# Patient Record
Sex: Male | Born: 1955 | Race: White | Hispanic: No | Smoking: Current every day smoker
Health system: Southern US, Community
[De-identification: ages and names within clinical notes are randomized; demographics above are authoritative.]

## PROBLEM LIST (undated history)

## (undated) DIAGNOSIS — M1990 Unspecified osteoarthritis, unspecified site: Secondary | ICD-10-CM

## (undated) DIAGNOSIS — Z720 Tobacco use: Secondary | ICD-10-CM

## (undated) DIAGNOSIS — R011 Cardiac murmur, unspecified: Secondary | ICD-10-CM

## (undated) DIAGNOSIS — I7090 Unspecified atherosclerosis: Secondary | ICD-10-CM

## (undated) DIAGNOSIS — I2510 Atherosclerotic heart disease of native coronary artery without angina pectoris: Secondary | ICD-10-CM

## (undated) HISTORY — DX: Unspecified osteoarthritis, unspecified site: M19.90

## (undated) HISTORY — DX: Unspecified atherosclerosis: I70.90

## (undated) HISTORY — DX: Tobacco use: Z72.0

---

## 2015-12-13 HISTORY — PX: BACK SURGERY: SHX140

## 2016-08-01 DIAGNOSIS — M5441 Lumbago with sciatica, right side: Secondary | ICD-10-CM | POA: Insufficient documentation

## 2016-08-01 DIAGNOSIS — N529 Male erectile dysfunction, unspecified: Secondary | ICD-10-CM

## 2016-08-01 DIAGNOSIS — R600 Localized edema: Secondary | ICD-10-CM

## 2016-08-01 DIAGNOSIS — F17210 Nicotine dependence, cigarettes, uncomplicated: Secondary | ICD-10-CM | POA: Insufficient documentation

## 2016-08-01 HISTORY — DX: Nicotine dependence, cigarettes, uncomplicated: F17.210

## 2016-08-01 HISTORY — DX: Localized edema: R60.0

## 2016-08-01 HISTORY — DX: Male erectile dysfunction, unspecified: N52.9

## 2017-07-20 ENCOUNTER — Other Ambulatory Visit: Payer: Self-pay | Admitting: Neurosurgery

## 2017-08-02 ENCOUNTER — Encounter (HOSPITAL_COMMUNITY): Payer: Self-pay

## 2017-08-02 ENCOUNTER — Encounter (HOSPITAL_COMMUNITY)
Admission: RE | Admit: 2017-08-02 | Discharge: 2017-08-02 | Disposition: A | Discharge status: Home / Self Care | Payer: BLUE CROSS/BLUE SHIELD | Source: Ambulatory Visit | Attending: Neurosurgery | Admitting: Neurosurgery

## 2017-08-02 DIAGNOSIS — M5011 Cervical disc disorder with radiculopathy,  high cervical region: Secondary | ICD-10-CM | POA: Diagnosis not present

## 2017-08-02 DIAGNOSIS — M5001 Cervical disc disorder with myelopathy,  high cervical region: Secondary | ICD-10-CM | POA: Diagnosis not present

## 2017-08-02 DIAGNOSIS — M4802 Spinal stenosis, cervical region: Secondary | ICD-10-CM | POA: Diagnosis not present

## 2017-08-02 DIAGNOSIS — G9589 Other specified diseases of spinal cord: Secondary | ICD-10-CM | POA: Diagnosis not present

## 2017-08-02 DIAGNOSIS — Z01812 Encounter for preprocedural laboratory examination: Secondary | ICD-10-CM | POA: Diagnosis not present

## 2017-08-02 LAB — BASIC METABOLIC PANEL
Anion gap: 8 (ref 5–15)
BUN: 11 mg/dL (ref 6–20)
CALCIUM: 9.1 mg/dL (ref 8.9–10.3)
CO2: 25 mmol/L (ref 22–32)
CREATININE: 1.07 mg/dL (ref 0.61–1.24)
Chloride: 108 mmol/L (ref 101–111)
GLUCOSE: 109 mg/dL — AB (ref 65–99)
Potassium: 4.2 mmol/L (ref 3.5–5.1)
Sodium: 141 mmol/L (ref 135–145)

## 2017-08-02 LAB — CBC
HCT: 38.1 % — ABNORMAL LOW (ref 39.0–52.0)
Hemoglobin: 13.3 g/dL (ref 13.0–17.0)
MCH: 33.3 pg (ref 26.0–34.0)
MCHC: 34.9 g/dL (ref 30.0–36.0)
MCV: 95.5 fL (ref 78.0–100.0)
PLATELETS: 241 10*3/uL (ref 150–400)
RBC: 3.99 MIL/uL — ABNORMAL LOW (ref 4.22–5.81)
RDW: 14.4 % (ref 11.5–15.5)
WBC: 11.8 10*3/uL — ABNORMAL HIGH (ref 4.0–10.5)

## 2017-08-02 LAB — TYPE AND SCREEN
ABO/RH(D): A POS
Antibody Screen: NEGATIVE

## 2017-08-02 LAB — ABO/RH: ABO/RH(D): A POS

## 2017-08-02 LAB — SURGICAL PCR SCREEN
MRSA, PCR: NEGATIVE
STAPHYLOCOCCUS AUREUS: NEGATIVE

## 2017-08-02 MED ORDER — CHLORHEXIDINE GLUCONATE CLOTH 2 % EX PADS: 6.0000 | MEDICATED_PAD | Freq: Once | CUTANEOUS | Status: DC

## 2017-08-02 NOTE — Pre-Procedure Instructions (Addendum)
    Joel Bailey  08/02/2017     No Pharmacies Listed   Your procedure is scheduled on August 07, 2017.  Report to [facility] Admitting at 530 AM.  Call this number if you have problems the morning of surgery:  [phone]   Remember:  Do not eat food or drink liquids after midnight.  Take these medicines the morning of surgery with A SIP OF WATER tylenol (acetaminophen)-if needed  7 days prior to surgery STOP taking any Aspirin, Aleve, Naproxen, Ibuprofen, Motrin, Advil, Goody's, BC's, all herbal medications, fish oil, and all vitamins   Do not wear jewelry, make-up or nail polish.  Do not wear lotions, powders, or perfumes, or deoderant.  Men may shave face and neck.  Do not bring valuables to the hospital.  [facility] is not responsible for any belongings or valuables.  Contacts, dentures or bridgework may not be worn into surgery.  Leave your suitcase in the car.  After surgery it may be brought to your room.  For patients admitted to the hospital, discharge time will be determined by your treatment team.  Patients discharged the day of surgery will not be allowed to drive home.   Special instructions:   [facility]- Preparing For Surgery  Before surgery, you can play an important role. Because skin is not sterile, your skin needs to be as free of germs as possible. You can reduce the number of germs on your skin by washing with CHG (chlorahexidine gluconate) Soap before surgery.  CHG is an antiseptic cleaner which kills germs and bonds with the skin to continue killing germs even after washing.  Please do not use if you have an allergy to CHG or antibacterial soaps. If your skin becomes reddened/irritated stop using the CHG.  Do not shave (including legs and underarms) for at least 48 hours prior to first CHG shower. It is OK to shave your face.  Please follow these instructions carefully.   1. Shower the NIGHT BEFORE SURGERY and the MORNING OF SURGERY with  CHG.   2. If you chose to wash your hair, wash your hair first as usual with your normal shampoo.  3. After you shampoo, rinse your hair and body thoroughly to remove the shampoo.  4. Use CHG as you would any other liquid soap. You can apply CHG directly to the skin and wash gently with a scrungie or a clean washcloth.   5. Apply the CHG Soap to your body ONLY FROM THE NECK DOWN.  Do not use on open wounds or open sores. Avoid contact with your eyes, ears, mouth and genitals (private parts). Wash genitals (private parts) with your normal soap.  6. Wash thoroughly, paying special attention to the area where your surgery will be performed.  7. Thoroughly rinse your body with warm water from the neck down.  8. DO NOT shower/wash with your normal soap after using and rinsing off the CHG Soap.  9. Pat yourself dry with a CLEAN TOWEL.   10. Wear CLEAN PAJAMAS   11. Place CLEAN SHEETS on your bed the night of your first shower and DO NOT SLEEP WITH PETS.    Day of Surgery: Do not apply any deodorants/lotions. Please wear clean clothes to the hospital/surgery center.     Please read over the following fact sheets that you were given. Pain Booklet, Coughing and Deep Breathing, MRSA Information and Surgical Site Infection Prevention

## 2017-08-02 NOTE — Progress Notes (Signed)
PCP: Dr. Annie Main  Cardiologist: pt denies  EKG: denies past year  Stress test: over 20+ years ago  ECHO: pt denies ever  Cardiac Cath: pt denies ever  Chest x-ray: pt denies past year

## 2017-08-06 NOTE — Anesthesia Preprocedure Evaluation (Addendum)
Anesthesia Evaluation  Patient identified by MRN, date of birth, ID band Patient awake    Reviewed: Allergy & Precautions, H&P , NPO status , Patient's Chart, lab work & pertinent test results  History of Anesthesia Complications Negative for: history of anesthetic complications  Airway Mallampati: II   Neck ROM: full    Dental   Pulmonary Current Smoker,    breath sounds clear to auscultation       Cardiovascular negative cardio ROS   Rhythm:regular Rate:Normal     Neuro/Psych negative psych ROS   GI/Hepatic negative GI ROS, Neg liver ROS,   Endo/Other  negative endocrine ROS  Renal/GU negative Renal ROS     Musculoskeletal negative musculoskeletal ROS (+)   Abdominal   Peds  Hematology negative hematology ROS (+)   Anesthesia Other Findings Day of surgery medications reviewed with the patient.  Reproductive/Obstetrics                            Anesthesia Physical Anesthesia Plan  ASA: II  Anesthesia Plan: General   Post-op Pain Management:    Induction: Intravenous  PONV Risk Score and Plan: 2 and Ondansetron, Dexamethasone and Treatment may vary due to age or medical condition  Airway Management Planned: Oral ETT  Additional Equipment:   Intra-op Plan:   Post-operative Plan: Extubation in OR  Informed Consent: I have reviewed the patients History and Physical, chart, labs and discussed the procedure including the risks, benefits and alternatives for the proposed anesthesia with the patient or authorized representative who has indicated his/her understanding and acceptance.     Plan Discussed with: CRNA, Anesthesiologist and Surgeon  Anesthesia Plan Comments:        Anesthesia Quick Evaluation

## 2017-08-07 ENCOUNTER — Encounter (HOSPITAL_COMMUNITY): Payer: Self-pay | Admitting: *Deleted

## 2017-08-07 ENCOUNTER — Inpatient Hospital Stay (HOSPITAL_COMMUNITY): Payer: BLUE CROSS/BLUE SHIELD

## 2017-08-07 ENCOUNTER — Inpatient Hospital Stay (HOSPITAL_COMMUNITY): Payer: BLUE CROSS/BLUE SHIELD | Admitting: Anesthesiology

## 2017-08-07 ENCOUNTER — Observation Stay (HOSPITAL_COMMUNITY)
Admission: RE | Admit: 2017-08-07 | Discharge: 2017-08-08 | Disposition: A | Discharge status: Home / Self Care | Payer: BLUE CROSS/BLUE SHIELD | Source: Ambulatory Visit | Attending: Neurosurgery | Admitting: Neurosurgery

## 2017-08-07 ENCOUNTER — Encounter (HOSPITAL_COMMUNITY)
Admission: RE | Disposition: A | Discharge status: Home / Self Care | Payer: Self-pay | Source: Ambulatory Visit | Attending: Neurosurgery

## 2017-08-07 DIAGNOSIS — M5011 Cervical disc disorder with radiculopathy,  high cervical region: Secondary | ICD-10-CM | POA: Diagnosis not present

## 2017-08-07 DIAGNOSIS — M5000 Cervical disc disorder with myelopathy, unspecified cervical region: Secondary | ICD-10-CM

## 2017-08-07 DIAGNOSIS — M4712 Other spondylosis with myelopathy, cervical region: Secondary | ICD-10-CM | POA: Insufficient documentation

## 2017-08-07 DIAGNOSIS — G9589 Other specified diseases of spinal cord: Secondary | ICD-10-CM | POA: Insufficient documentation

## 2017-08-07 DIAGNOSIS — M5001 Cervical disc disorder with myelopathy,  high cervical region: Secondary | ICD-10-CM | POA: Diagnosis present

## 2017-08-07 DIAGNOSIS — G8250 Quadriplegia, unspecified: Secondary | ICD-10-CM | POA: Diagnosis not present

## 2017-08-07 DIAGNOSIS — M5416 Radiculopathy, lumbar region: Secondary | ICD-10-CM | POA: Insufficient documentation

## 2017-08-07 DIAGNOSIS — F17210 Nicotine dependence, cigarettes, uncomplicated: Secondary | ICD-10-CM | POA: Diagnosis not present

## 2017-08-07 DIAGNOSIS — M4802 Spinal stenosis, cervical region: Secondary | ICD-10-CM | POA: Diagnosis not present

## 2017-08-07 HISTORY — PX: ANTERIOR CERVICAL DECOMPRESSION/DISCECTOMY FUSION 4 LEVELS: SHX5556

## 2017-08-07 HISTORY — DX: Cervical disc disorder with myelopathy, unspecified cervical region: M50.00

## 2017-08-07 SURGERY — ANTERIOR CERVICAL DECOMPRESSION/DISCECTOMY FUSION 4 LEVELS
Anesthesia: General

## 2017-08-07 MED ORDER — BUPIVACAINE HCL (PF) 0.5 % IJ SOLN
INTRAMUSCULAR | Status: DC | PRN
  Administered 2017-08-07: 10 mL

## 2017-08-07 MED ORDER — EPHEDRINE SULFATE 50 MG/ML IJ SOLN
INTRAMUSCULAR | Status: DC | PRN
  Administered 2017-08-07: 10 mg via INTRAVENOUS

## 2017-08-07 MED ORDER — MIDAZOLAM HCL 5 MG/5ML IJ SOLN
INTRAMUSCULAR | Status: DC | PRN
  Administered 2017-08-07: 2 mg via INTRAVENOUS

## 2017-08-07 MED ORDER — BUPIVACAINE HCL (PF) 0.5 % IJ SOLN
INTRAMUSCULAR | Status: AC
  Filled 2017-08-07: qty 30

## 2017-08-07 MED ORDER — MIDAZOLAM HCL 2 MG/2ML IJ SOLN
INTRAMUSCULAR | Status: AC
  Filled 2017-08-07: qty 2

## 2017-08-07 MED ORDER — ALBUMIN HUMAN 5 % IV SOLN
INTRAVENOUS | Status: DC | PRN
  Administered 2017-08-07: 11:00:00 via INTRAVENOUS

## 2017-08-07 MED ORDER — ONDANSETRON HCL 4 MG PO TABS: 4.0000 mg | ORAL_TABLET | Freq: Four times a day (QID) | ORAL | Status: DC | PRN

## 2017-08-07 MED ORDER — ROCURONIUM BROMIDE 100 MG/10ML IV SOLN
INTRAVENOUS | Status: DC | PRN
  Administered 2017-08-07: 20 mg via INTRAVENOUS
  Administered 2017-08-07: 50 mg via INTRAVENOUS
  Administered 2017-08-07: 30 mg via INTRAVENOUS
  Administered 2017-08-07: 10 mg via INTRAVENOUS
  Administered 2017-08-07: 50 mg via INTRAVENOUS
  Administered 2017-08-07: 30 mg via INTRAVENOUS

## 2017-08-07 MED ORDER — CYCLOBENZAPRINE HCL 5 MG PO TABS
5.0000 mg | ORAL_TABLET | Freq: Three times a day (TID) | ORAL | Status: DC | PRN
  Administered 2017-08-07 – 2017-08-08 (×2): 10 mg via ORAL
  Filled 2017-08-07 (×2): qty 2

## 2017-08-07 MED ORDER — FENTANYL CITRATE (PF) 250 MCG/5ML IJ SOLN
INTRAMUSCULAR | Status: AC
  Filled 2017-08-07: qty 5

## 2017-08-07 MED ORDER — PHENYLEPHRINE 40 MCG/ML (10ML) SYRINGE FOR IV PUSH (FOR BLOOD PRESSURE SUPPORT)
PREFILLED_SYRINGE | INTRAVENOUS | Status: AC
  Filled 2017-08-07: qty 10

## 2017-08-07 MED ORDER — PROPOFOL 10 MG/ML IV BOLUS
INTRAVENOUS | Status: DC | PRN
  Administered 2017-08-07: 20 mg via INTRAVENOUS
  Administered 2017-08-07: 160 mg via INTRAVENOUS

## 2017-08-07 MED ORDER — MENTHOL 3 MG MT LOZG
1.0000 | LOZENGE | OROMUCOSAL | Status: DC | PRN
  Filled 2017-08-07: qty 9

## 2017-08-07 MED ORDER — THROMBIN 20000 UNITS EX SOLR
CUTANEOUS | Status: AC
  Filled 2017-08-07: qty 20000

## 2017-08-07 MED ORDER — PHENYLEPHRINE HCL 10 MG/ML IJ SOLN
INTRAVENOUS | Status: DC | PRN
  Administered 2017-08-07: 50 ug/min via INTRAVENOUS

## 2017-08-07 MED ORDER — HYDROXYZINE HCL 50 MG PO TABS: 50.0000 mg | ORAL_TABLET | ORAL | Status: DC | PRN

## 2017-08-07 MED ORDER — BISACODYL 10 MG RE SUPP: 10.0000 mg | Freq: Every day | RECTAL | Status: DC | PRN

## 2017-08-07 MED ORDER — KETOROLAC TROMETHAMINE 30 MG/ML IJ SOLN
30.0000 mg | Freq: Once | INTRAMUSCULAR | Status: AC
  Administered 2017-08-07: 30 mg via INTRAVENOUS

## 2017-08-07 MED ORDER — MAGNESIUM HYDROXIDE 400 MG/5ML PO SUSP: 30.0000 mL | Freq: Every day | ORAL | Status: DC | PRN

## 2017-08-07 MED ORDER — PHENOL 1.4 % MT LIQD
1.0000 | OROMUCOSAL | Status: DC | PRN
  Administered 2017-08-07: 1 via OROMUCOSAL
  Filled 2017-08-07: qty 177

## 2017-08-07 MED ORDER — ROCURONIUM BROMIDE 10 MG/ML (PF) SYRINGE
PREFILLED_SYRINGE | INTRAVENOUS | Status: AC
  Filled 2017-08-07: qty 5

## 2017-08-07 MED ORDER — SODIUM CHLORIDE 0.9% FLUSH: 3.0000 mL | INTRAVENOUS | Status: DC | PRN

## 2017-08-07 MED ORDER — PROPOFOL 10 MG/ML IV BOLUS
INTRAVENOUS | Status: AC
  Filled 2017-08-07: qty 20

## 2017-08-07 MED ORDER — LIDOCAINE-EPINEPHRINE 1 %-1:100000 IJ SOLN
INTRAMUSCULAR | Status: AC
  Filled 2017-08-07: qty 1

## 2017-08-07 MED ORDER — CEFAZOLIN SODIUM-DEXTROSE 2-4 GM/100ML-% IV SOLN
2.0000 g | INTRAVENOUS | Status: AC
  Administered 2017-08-07 (×2): 2 g via INTRAVENOUS
  Filled 2017-08-07: qty 100

## 2017-08-07 MED ORDER — MORPHINE SULFATE (PF) 4 MG/ML IV SOLN
4.0000 mg | INTRAVENOUS | Status: DC | PRN
Start: 2017-08-07 — End: 2017-08-08
  Administered 2017-08-07: 4 mg via INTRAMUSCULAR
  Filled 2017-08-07: qty 1

## 2017-08-07 MED ORDER — ACETAMINOPHEN 10 MG/ML IV SOLN
INTRAVENOUS | Status: DC | PRN
  Administered 2017-08-07: 1000 mg via INTRAVENOUS

## 2017-08-07 MED ORDER — LIDOCAINE HCL 4 % EX SOLN
CUTANEOUS | Status: DC | PRN
  Administered 2017-08-07: 4 mL via TOPICAL

## 2017-08-07 MED ORDER — THROMBIN 20000 UNITS EX SOLR
CUTANEOUS | Status: DC | PRN
  Administered 2017-08-07: 08:00:00 via TOPICAL

## 2017-08-07 MED ORDER — ALUM & MAG HYDROXIDE-SIMETH 200-200-20 MG/5ML PO SUSP: 30.0000 mL | Freq: Four times a day (QID) | ORAL | Status: DC | PRN

## 2017-08-07 MED ORDER — ONDANSETRON HCL 4 MG/2ML IJ SOLN
INTRAMUSCULAR | Status: DC | PRN
  Administered 2017-08-07: 4 mg via INTRAVENOUS

## 2017-08-07 MED ORDER — LACTATED RINGERS IV SOLN
INTRAVENOUS | Status: DC | PRN
  Administered 2017-08-07 (×3): via INTRAVENOUS

## 2017-08-07 MED ORDER — LIDOCAINE HCL (CARDIAC) 20 MG/ML IV SOLN
INTRAVENOUS | Status: DC | PRN
  Administered 2017-08-07: 60 mg via INTRAVENOUS

## 2017-08-07 MED ORDER — SODIUM CHLORIDE 0.9% FLUSH
3.0000 mL | Freq: Two times a day (BID) | INTRAVENOUS | Status: DC
  Administered 2017-08-07 – 2017-08-08 (×3): 3 mL via INTRAVENOUS

## 2017-08-07 MED ORDER — ONDANSETRON HCL 4 MG/2ML IJ SOLN: 4.0000 mg | Freq: Four times a day (QID) | INTRAMUSCULAR | Status: DC | PRN

## 2017-08-07 MED ORDER — GLYCOPYRROLATE 0.2 MG/ML IJ SOLN
INTRAMUSCULAR | Status: DC | PRN
  Administered 2017-08-07: 0.2 mg via INTRAVENOUS

## 2017-08-07 MED ORDER — THROMBIN 5000 UNITS EX SOLR
OROMUCOSAL | Status: DC | PRN
  Administered 2017-08-07: 08:00:00 via TOPICAL

## 2017-08-07 MED ORDER — DEXAMETHASONE SODIUM PHOSPHATE 4 MG/ML IJ SOLN
4.0000 mg | Freq: Four times a day (QID) | INTRAMUSCULAR | Status: DC
  Administered 2017-08-07 – 2017-08-08 (×5): 4 mg via INTRAVENOUS
  Filled 2017-08-07 (×5): qty 1

## 2017-08-07 MED ORDER — 0.9 % SODIUM CHLORIDE (POUR BTL) OPTIME
TOPICAL | Status: DC | PRN
  Administered 2017-08-07: 1000 mL

## 2017-08-07 MED ORDER — SODIUM CHLORIDE 0.9 % IR SOLN
Status: DC | PRN
  Administered 2017-08-07: 08:00:00

## 2017-08-07 MED ORDER — LIDOCAINE 2% (20 MG/ML) 5 ML SYRINGE
INTRAMUSCULAR | Status: AC
  Filled 2017-08-07: qty 5

## 2017-08-07 MED ORDER — KETOROLAC TROMETHAMINE 30 MG/ML IJ SOLN
INTRAMUSCULAR | Status: AC
  Administered 2017-08-07: 30 mg via INTRAVENOUS
  Filled 2017-08-07: qty 1

## 2017-08-07 MED ORDER — THROMBIN 5000 UNITS EX SOLR
CUTANEOUS | Status: AC
  Filled 2017-08-07: qty 5000

## 2017-08-07 MED ORDER — SUCCINYLCHOLINE CHLORIDE 200 MG/10ML IV SOSY
PREFILLED_SYRINGE | INTRAVENOUS | Status: AC
  Filled 2017-08-07: qty 10

## 2017-08-07 MED ORDER — FENTANYL CITRATE (PF) 100 MCG/2ML IJ SOLN
INTRAMUSCULAR | Status: DC | PRN
  Administered 2017-08-07: 50 ug via INTRAVENOUS
  Administered 2017-08-07: 150 ug via INTRAVENOUS
  Administered 2017-08-07 (×3): 50 ug via INTRAVENOUS

## 2017-08-07 MED ORDER — SUGAMMADEX SODIUM 200 MG/2ML IV SOLN
INTRAVENOUS | Status: DC | PRN
  Administered 2017-08-07: 200 mg via INTRAVENOUS

## 2017-08-07 MED ORDER — LIDOCAINE-EPINEPHRINE 1 %-1:100000 IJ SOLN
INTRAMUSCULAR | Status: DC | PRN
  Administered 2017-08-07: 10 mL

## 2017-08-07 MED ORDER — OXYCODONE HCL 5 MG PO TABS
5.0000 mg | ORAL_TABLET | Freq: Once | ORAL | Status: DC | PRN
Start: 2017-08-07 — End: 2017-08-07

## 2017-08-07 MED ORDER — HYDROCODONE-ACETAMINOPHEN 5-325 MG PO TABS
1.0000 | ORAL_TABLET | ORAL | Status: DC | PRN
  Administered 2017-08-07 – 2017-08-08 (×3): 2 via ORAL
  Filled 2017-08-07 (×4): qty 2

## 2017-08-07 MED ORDER — KETOROLAC TROMETHAMINE 30 MG/ML IJ SOLN
30.0000 mg | Freq: Four times a day (QID) | INTRAMUSCULAR | Status: DC
  Administered 2017-08-07 – 2017-08-08 (×4): 30 mg via INTRAVENOUS
  Filled 2017-08-07 (×4): qty 1

## 2017-08-07 MED ORDER — HYDROXYZINE HCL 50 MG/ML IM SOLN: 50.0000 mg | INTRAMUSCULAR | Status: DC | PRN

## 2017-08-07 MED ORDER — FLEET ENEMA 7-19 GM/118ML RE ENEM: 1.0000 | ENEMA | Freq: Once | RECTAL | Status: DC | PRN

## 2017-08-07 MED ORDER — ACETAMINOPHEN 325 MG PO TABS: 650.0000 mg | ORAL_TABLET | ORAL | Status: DC | PRN

## 2017-08-07 MED ORDER — HYDROMORPHONE HCL 1 MG/ML IJ SOLN: 0.2500 mg | INTRAMUSCULAR | Status: DC | PRN

## 2017-08-07 MED ORDER — ACETAMINOPHEN 650 MG RE SUPP: 650.0000 mg | RECTAL | Status: DC | PRN

## 2017-08-07 MED ORDER — DEXAMETHASONE SODIUM PHOSPHATE 10 MG/ML IJ SOLN
INTRAMUSCULAR | Status: DC | PRN
  Administered 2017-08-07: 10 mg via INTRAVENOUS

## 2017-08-07 MED ORDER — ACETAMINOPHEN 10 MG/ML IV SOLN
INTRAVENOUS | Status: AC
  Filled 2017-08-07: qty 100

## 2017-08-07 MED ORDER — OXYCODONE HCL 5 MG/5ML PO SOLN: 5.0000 mg | Freq: Once | ORAL | Status: DC | PRN

## 2017-08-07 MED ORDER — KCL IN DEXTROSE-NACL 20-5-0.45 MEQ/L-%-% IV SOLN: INTRAVENOUS | Status: DC

## 2017-08-07 SURGICAL SUPPLY — 64 items
ADH SKN CLS APL DERMABOND .7 (GAUZE/BANDAGES/DRESSINGS) ×1
ALLOGRAFT 5X14X11 (Bone Implant) ×4 IMPLANT
BAG DECANTER FOR FLEXI CONT (MISCELLANEOUS) ×2 IMPLANT
BIT DRILL 13 (BIT) ×1 IMPLANT
BIT DRILL NEURO 2X3.1 SFT TUCH (MISCELLANEOUS) ×1 IMPLANT
BLADE ULTRA TIP 2M (BLADE) ×2 IMPLANT
CANISTER SUCT 3000ML PPV (MISCELLANEOUS) ×2 IMPLANT
CARTRIDGE OIL MAESTRO DRILL (MISCELLANEOUS) ×1 IMPLANT
CLIP TI MEDIUM 6 (CLIP) ×1 IMPLANT
CORD BIPOLAR FORCEPS 12FT (ELECTRODE) ×1 IMPLANT
COVER MAYO STAND STRL (DRAPES) ×2 IMPLANT
DECANTER SPIKE VIAL GLASS SM (MISCELLANEOUS) ×2 IMPLANT
DERMABOND ADVANCED (GAUZE/BANDAGES/DRESSINGS) ×1
DERMABOND ADVANCED .7 DNX12 (GAUZE/BANDAGES/DRESSINGS) ×1 IMPLANT
DIFFUSER DRILL AIR PNEUMATIC (MISCELLANEOUS) ×2 IMPLANT
DRAPE HALF SHEET 40X57 (DRAPES) IMPLANT
DRAPE LAPAROTOMY 100X72 PEDS (DRAPES) ×2 IMPLANT
DRAPE MICROSCOPE LEICA (MISCELLANEOUS) ×2 IMPLANT
DRAPE POUCH INSTRU U-SHP 10X18 (DRAPES) ×2 IMPLANT
DRILL NEURO 2X3.1 SOFT TOUCH (MISCELLANEOUS) ×2
ELECT COATED BLADE 2.86 ST (ELECTRODE) ×3 IMPLANT
ELECT REM PT RETURN 9FT ADLT (ELECTROSURGICAL) ×2
ELECTRODE REM PT RTRN 9FT ADLT (ELECTROSURGICAL) ×1 IMPLANT
GLOVE BIOGEL PI IND STRL 8 (GLOVE) ×1 IMPLANT
GLOVE BIOGEL PI INDICATOR 8 (GLOVE) ×1
GLOVE ECLIPSE 7.5 STRL STRAW (GLOVE) ×2 IMPLANT
GLOVE EXAM NITRILE LRG STRL (GLOVE) IMPLANT
GLOVE EXAM NITRILE XL STR (GLOVE) IMPLANT
GLOVE EXAM NITRILE XS STR PU (GLOVE) IMPLANT
GOWN STRL REUS W/ TWL LRG LVL3 (GOWN DISPOSABLE) IMPLANT
GOWN STRL REUS W/ TWL XL LVL3 (GOWN DISPOSABLE) IMPLANT
GOWN STRL REUS W/TWL 2XL LVL3 (GOWN DISPOSABLE) IMPLANT
GOWN STRL REUS W/TWL LRG LVL3 (GOWN DISPOSABLE)
GOWN STRL REUS W/TWL XL LVL3 (GOWN DISPOSABLE)
HALTER HD/CHIN CERV TRACTION D (MISCELLANEOUS) ×2 IMPLANT
HEMOSTAT POWDER KIT SURGIFOAM (HEMOSTASIS) ×2 IMPLANT
KIT BASIN OR (CUSTOM PROCEDURE TRAY) ×2 IMPLANT
KIT ROOM TURNOVER OR (KITS) ×2 IMPLANT
NDL HYPO 25X1 1.5 SAFETY (NEEDLE) ×1 IMPLANT
NDL SPNL 22GX3.5 QUINCKE BK (NEEDLE) ×1 IMPLANT
NEEDLE HYPO 25X1 1.5 SAFETY (NEEDLE) ×2 IMPLANT
NEEDLE SPNL 22GX3.5 QUINCKE BK (NEEDLE) ×4 IMPLANT
NS IRRIG 1000ML POUR BTL (IV SOLUTION) ×2 IMPLANT
OIL CARTRIDGE MAESTRO DRILL (MISCELLANEOUS) ×2
PACK LAMINECTOMY NEURO (CUSTOM PROCEDURE TRAY) ×2 IMPLANT
PAD ARMBOARD 7.5X6 YLW CONV (MISCELLANEOUS) ×6 IMPLANT
PATTIES SURGICAL 1X1 (DISPOSABLE) ×1 IMPLANT
PENCIL BUTTON HOLSTER BLD 10FT (ELECTRODE) ×1 IMPLANT
PLATE 4 75XNS SPNE CVD ANT T (Plate) IMPLANT
PLATE 4 ATLANTIS TRANS (Plate) ×2 IMPLANT
RUBBERBAND STERILE (MISCELLANEOUS) ×4 IMPLANT
SCREW SD 15MM FA (Screw) ×8 IMPLANT
SCREW SPINAL 4X16MM SELF DRILL (Screw) ×2 IMPLANT
SPONGE INTESTINAL PEANUT (DISPOSABLE) ×1 IMPLANT
SPONGE SURGIFOAM ABS GEL 100 (HEMOSTASIS) ×2 IMPLANT
STAPLER SKIN PROX WIDE 3.9 (STAPLE) ×1 IMPLANT
SUT VIC AB 0 CT1 18XCR BRD8 (SUTURE) IMPLANT
SUT VIC AB 0 CT1 8-18 (SUTURE)
SUT VIC AB 2-0 CP2 18 (SUTURE) ×2 IMPLANT
SUT VIC AB 3-0 SH 8-18 (SUTURE) ×4 IMPLANT
SYR BULB 3OZ (MISCELLANEOUS) ×1 IMPLANT
TOWEL GREEN STERILE (TOWEL DISPOSABLE) ×2 IMPLANT
TOWEL GREEN STERILE FF (TOWEL DISPOSABLE) IMPLANT
WATER STERILE IRR 1000ML POUR (IV SOLUTION) ×2 IMPLANT

## 2017-08-07 NOTE — Op Note (Signed)
08/07/2017  11:54 AM  PATIENT:  Joel Bailey  61 y.o. 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 Patient was placed in 10 pounds of halter traction. The neck was prepped with Betadine soap and solution and draped in a sterile fashion. A obliquel incision was made on the left side of the neck paralleling the anterior border of the sternocleidomastoid.. The line of the incision was infiltrated with local anesthetic with epinephrine. Dissection was carried down thru the subcutaneous tissue and platysma, bipolar cautery was used to maintain hemostasis. Dissection was then carried out thru a plane leaving the sternocleidomastoid carotid artery and jugular vein laterally and the trachea and esophagus medially. A superficial jugular vein was divided to allow for the exposure, using hemoclips and bipolar cautery. The ventral aspect of the vertebral column was identified and a localizing x-ray was taken. The C3-4, C4-5, C5-C6, and C6-7 levels were identified. The annulus at each level was incised and the disc space entered. At each level the disc space was quite  narrow, and the high-speed drill was used to help enter the disc space. The ventral osteophytic overgrowth was removed using the osteophyte removal tool and the high-speed drill. Discectomy was performed with micro-curettes, pituitary rongeurs, and the high-speed drill. The operating microscope was draped and brought into the field provided additional magnification illumination and visualization. Discectomy was continued posteriorly thru the disc space and then the cartilaginous endplate was removed using micro-curettes along with the high-speed drill. Posterior osteophytic overgrowth was removed each level using the high-speed drill along with 1 and 2 mm thin footplated Kerrison punches. Posterior longitudinal ligament along with disc herniation was carefully removed, decompressing the spinal canal and thecal sac. We then continued to remove osteophytic overgrowth and disc material decompressing the neural foramina and exiting nerve roots bilaterally. Once the decompression was completed hemostasis was established at each level with the use of Gelfoam with thrombin and bipolar cautery. The Gelfoam was removed, a thin layer Surgifoam applied, the wound irrigated and hemostasis confirmed. We then measured the height of each intravertebral disc space level and selected a 5 millimeter in height structural allograft for the C3-4 level, a 5 millimeter in height structural allograft for the C4-5 level, a 5 millimeter in height structural allograft for the C5-6 level, and a 5 millimeter in height structural allograft for the C6-7 level . Each was hydrated and saline solution and then gently positioned in the intravertebral disc space and countersunk. We then selected a 75 millimeter in height Atlantis translational cervical plate. It was positioned over the fusion construct and secured to the vertebra with 4 mm in diameter fixed self drilling screws. We used a pair of 15 mm screws at C3, C4, C6, and C7, and a pair of 16 mm  screws at C5. Each screw hole was started with the high-speed drill and then the  screws placed, once all the screws were placed, the locking system was secured. The wound was irrigated with bacitracin solution checked for hemostasis which was established and confirmed. An x-ray was taken which showed the grafts in good position, the plate and screws in good position, and the overall alignment to be good. Hemostasis was again confirmed. We then proceeded with closure. The platysma was closed with interrupted inverted 2-0 undyed Vicryl suture, the subcutaneous and subcuticular closed with interrupted inverted 3-0 undyed Vicryl suture. The skin edges were approximated with Dermabond. Following surgery the patient was taken out of cervical traction. To be reversed and the anesthetic and taken to the recovery room for further care.   PLAN OF CARE: Admit for overnight observation  PATIENT DISPOSITION:  PACU - hemodynamically stable.   Delay start of Pharmacological VTE agent (>24hrs) due to surgical blood loss or risk of bleeding:  yes

## 2017-08-07 NOTE — Transfer of Care (Signed)
Immediate Anesthesia Transfer of Care Note  Patient: Joel Bailey  Procedure(s) Performed: Procedure(s): ANTERIOR CERVICAL DECOMPRESSION/DISCECTOMY FUSION CERVICAL 3- CERVICAL 4, CERVICAL 4 - CERVICAL 5, CERVICAL 5- CERVICAL 6, CERVICAL 6- CERVICAL 7 (N/A)  Patient Location: PACU  Anesthesia Type:General  Level of Consciousness: awake, alert  and sedated  Airway & Oxygen Therapy: Patient connected to face mask oxygen  Post-op Assessment: Post -op Vital signs reviewed and stable  Post vital signs: stable  Last Vitals:  Vitals:   08/07/17 0615 08/07/17 1207  BP: 122/73   Pulse: (!) 58   Resp: 18 (P) 15  Temp: 37.1 C (P) 36.9 C  SpO2: 98% (P) 100%    Last Pain:  Vitals:   08/07/17 0617  TempSrc:   PainSc: 5          Complications: No apparent anesthesia complications

## 2017-08-07 NOTE — Anesthesia Procedure Notes (Signed)
Procedure Name: Intubation Date/Time: 08/07/2017 7:40 AM Performed by: Lavell Luster Pre-anesthesia Checklist: Patient identified, Suction available, Emergency Drugs available, Patient being monitored and Timeout performed Patient Re-evaluated:Patient Re-evaluated prior to induction Oxygen Delivery Method: Circle system utilized Preoxygenation: Pre-oxygenation with 100% oxygen Induction Type: IV induction Ventilation: Mask ventilation without difficulty Laryngoscope Size: Mac and 4 Grade View: Grade I Tube type: Oral Tube size: 7.5 mm Number of attempts: 1 Airway Equipment and Method: Stylet Placement Confirmation: ETT inserted through vocal cords under direct vision,  positive ETCO2 and breath sounds checked- equal and bilateral Secured at: 21 cm Tube secured with: Tape Dental Injury: Teeth and Oropharynx as per pre-operative assessment

## 2017-08-07 NOTE — Progress Notes (Signed)
Vitals:   08/07/17 1230 08/07/17 1300 08/07/17 1320 08/07/17 1629  BP: (!) 118/96  104/65 (!) 105/55  Pulse: 88 81 76 62  Resp: 18 13 18 18   Temp:  98 F (36.7 C) 98.6 F (37 C) 98 F (36.7 C)  TempSrc:      SpO2: 97% 94% 94% 95%  Weight:      Height:        Patient resting in bed, has ambulated in the halls. Wound clean and dry; no erythema, swelling, or drainage. Immobilized in Aspen cervical collar. Foley DC'd, patient has not yet voided. Nursing staff to monitor voiding function.  Plan: Encouraged to ambulate in the halls. Continue to progress through postoperative recovery.  Hewitt Shorts, MD 08/07/2017, 6:19 PM

## 2017-08-07 NOTE — H&P (Signed)
Subjective: Patient is a 61 y.o. right handed white male who is admitted for treatment of multilevel, multifactorial cervical stenosis with cervical myeloradiculopathy secondary to multilevel cervical spondylosis, degenerative disc disease, and spondylitic disc herniation. Patient's been found to have weakness and atrophy in the right upper extremity including the biceps, thenar eminence, intrinsics and grip, as well as weakness in the right dorsiflexor and EHL. He does have significant degenerative changes in the lumbar spine, and is status post a right L5-S1 lumbar laminotomy and foraminotomy last year. However MRI scan shows some increased signal in the spinal cord from C4-C5 with myelomalacia consistent with myelopathy due to chronic spinal cord compression, particularly on the right side. Patient admitted now for a 4 level C3-4, C4-5, C5-6, and C6-7 anterior cervical decompression arthrodesis with structural allograft and cervical plating.    History reviewed. No pertinent past medical history.  Past Surgical History:  Procedure Laterality Date  . BACK SURGERY  2017    No prescriptions prior to admission.   No Known Allergies  Social History  Substance Use Topics  . Smoking status: Current Every Day Smoker    Years: 1.00  . Smokeless tobacco: Never Used  . Alcohol use No    History reviewed. No pertinent family history.   Review of Systems Pertinent items are noted in HPI.  Objective: Vital signs in last 24 hours: Temp:  [98.7 F (37.1 C)] 98.7 F (37.1 C) (08/27 0615) Pulse Rate:  [58] 58 (08/27 0615) Resp:  [18] 18 (08/27 0615) BP: (122)/(73) 122/73 (08/27 0615) SpO2:  [98 %] 98 % (08/27 0615) Weight:  [68 kg (150 lb)] 68 kg (150 lb) (08/27 0617)  EXAM:  Patient is well-developed well-nourished white male in no acute distress.  Lungs are clear to auscultation , the patient has symmetrical respiratory excursion. Heart has a regular rate and rhythm normal S1 and S2 no murmur.    Abdomen is soft nontender nondistended bowel sounds are present. Extremity examination shows no clubbing cyanosis or edema. Neurologic examination shows in the upper extremities atrophy of the biceps bilaterally, as well as of the right thenar eminence. Strength examination shows 5/5 strength in the left upper extremity including the deltoid, biceps, triceps, intrinsics, and grip. However in the right upper extremity the deltoid is 5, the biceps is 4 minus, triceps is 5, the intrinsics and grip are 4. No lower extremity strength is 5/5 in the iliopsoas and quadriceps is probably as well as the left dorsiflexor, EHL, and plantar flexor. However the distal right lotion of the dorsiflexors 4+ to 5. The right EHL is 2, and the plantar flexor is 5. Sensation is intact to pinprick in the distal upper and lower extremities. Reflexes are absent at the biceps and brachial radialis bilaterally. Left triceps is 1-2, right triceps is 2. Left quadriceps is minimal, right quadriceps is 2. Left gastrocnemius is minimal, right gastrocnemius is absent. Toes are downgoing bilaterally. Gait and stance favor the right lower extremity.  Data Review:CBC    Component Value Date/Time   WBC 11.8 (H) 08/02/2017 1448   RBC 3.99 (L) 08/02/2017 1448   HGB 13.3 08/02/2017 1448   HCT 38.1 (L) 08/02/2017 1448   PLT 241 08/02/2017 1448   MCV 95.5 08/02/2017 1448   MCH 33.3 08/02/2017 1448   MCHC 34.9 08/02/2017 1448   RDW 14.4 08/02/2017 1448  BMET    Component Value Date/Time   NA 141 08/02/2017 1448   K 4.2 08/02/2017 1448   CL 108 08/02/2017 1448   CO2 25 08/02/2017 1448   GLUCOSE 109 (H) 08/02/2017 1448   BUN 11 08/02/2017 1448   CREATININE 1.07 08/02/2017 1448   CALCIUM 9.1 08/02/2017 1448   GFRNONAA >60 08/02/2017 1448   GFRAA >60 08/02/2017 1448     Assessment/Plan: Patient with complaints of both cervical myeloradiculopathy, as well as right lumbar radiculopathy. There are  degenerative changes of both the cervical and lumbar spine, however in the cervical spine there is 4 level advanced degeneration with advanced degenerative disc disease and spondylosis with superimposed spondylitic disc herniations and resulting cervical stenosis, C3-4, C4-5, C5-6, and C6-7. There is evidence of increased single spinal cord and myelomalacia. Patient admitted now for a 4 level C3-4, C4-5, C5-6, and C6-7 ACDF.  I've discussed with the patient the nature of his condition, the nature the surgical procedure, the typical length of surgery, hospital stay, and overall recuperation. We discussed limitations postoperatively. I discussed risks of surgery including risks of infection, bleeding, possibly need for transfusion, the risk of nerve root dysfunction with pain, weakness, numbness, or paresthesias, the risk of spinal cord dysfunction with paralysis of all 4 limbs and quadriplegia, and the risk of dural tear and CSF leakage and possible need for further surgery, the risk of esophageal dysfunction causing dysphagia and the risk of laryngeal dysfunction causing hoarseness of the voice, the risk of failure of the arthrodesis and the possible need for further surgery, and the risk of anesthetic complications including myocardial infarction, stroke, pneumonia, and death. We also discussed the need for postoperative immobilization in a cervical collar. Understanding all this the patient does wish to proceed with surgery and is admitted for such.    Hewitt Shorts, MD 08/07/2017 7:21 AM

## 2017-08-07 NOTE — Anesthesia Postprocedure Evaluation (Signed)
Anesthesia Post Note  Patient: Joel Bailey  Procedure(s) Performed: Procedure(s) (LRB): ANTERIOR CERVICAL DECOMPRESSION/DISCECTOMY FUSION CERVICAL 3- CERVICAL 4, CERVICAL 4 - CERVICAL 5, CERVICAL 5- CERVICAL 6, CERVICAL 6- CERVICAL 7 (N/A)     Patient location during evaluation: PACU Anesthesia Type: General Level of consciousness: awake and alert Pain management: pain level controlled Vital Signs Assessment: post-procedure vital signs reviewed and stable Respiratory status: spontaneous breathing, nonlabored ventilation, respiratory function stable and patient connected to nasal cannula oxygen Cardiovascular status: blood pressure returned to baseline and stable Postop Assessment: no signs of nausea or vomiting Anesthetic complications: no    Last Vitals:  Vitals:   08/07/17 1300 08/07/17 1320  BP:  104/65  Pulse: 81 76  Resp: 13 18  Temp: 36.7 C 37 C  SpO2: 94% 94%    Last Pain:  Vitals:   08/07/17 1401  TempSrc:   PainSc: 4                  Yomaris Palecek S

## 2017-08-08 DIAGNOSIS — M5001 Cervical disc disorder with myelopathy,  high cervical region: Secondary | ICD-10-CM | POA: Diagnosis not present

## 2017-08-08 MED ORDER — CYCLOBENZAPRINE HCL 10 MG PO TABS: 10.0000 mg | ORAL_TABLET | Freq: Three times a day (TID) | ORAL | Status: DC | PRN

## 2017-08-08 MED ORDER — CYCLOBENZAPRINE HCL 10 MG PO TABS: 5.0000 mg | ORAL_TABLET | Freq: Three times a day (TID) | ORAL | 0 refills | Status: DC | PRN

## 2017-08-08 MED ORDER — HYDROCODONE-ACETAMINOPHEN 5-325 MG PO TABS: 1.0000 | ORAL_TABLET | ORAL | 0 refills | Status: DC | PRN

## 2017-08-08 NOTE — Discharge Instructions (Signed)

## 2017-08-08 NOTE — Progress Notes (Signed)
Pt doing well. Pt and family given D/C instructions with Rx's, verbal understanding was provided. Pt's incision is clean and dry with no sign of infection. Pt's IV was removed prior to D/C. Pt D/C'd home via walking @ 1430 per MD order. Pt is stable @ D/C and has no other needs at this time. Rema Fendt, RN

## 2017-08-08 NOTE — Discharge Summary (Signed)
Physician Discharge Summary  Patient ID: Joel Bailey MRN: [id number] DOB/AGE: 07/16/1956 61 y.o.  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 He is done well following surgery. He is up and ambulate actively in the halls. He is voiding well. His incision is healing nicely. There is no swelling, erythema, or drainage. He is asking to be discharged to home. He has been given instructions regarding wound care and activities following discharge. He is scheduled to follow-up with me in the office in 3 weeks.  Discharge Exam: Blood pressure 114/71, pulse (!) 52, temperature 97.8 F (36.6 C), temperature source Oral, resp. rate 18, height 5\' 11"  (1.803 m), weight 68 kg (150 lb), SpO2 98 %.  Disposition:  Home  Discharge Instructions    Discharge wound care:    Complete by:  As directed    Leave the wound open to air. Shower daily with the wound uncovered. Water and soapy water should run over the incision area. Do not wash directly on the incision for 2 weeks. Remove the glue after 2 weeks.   Driving Restrictions    Complete by:  As directed    No driving for 2 weeks. May ride in the car locally now. May begin to drive locally in 2 weeks.   Other Restrictions    Complete by:  As directed    Walk gradually increasing distances out in the fresh air at least twice a day. Walking additional 6 times inside the house, gradually  increasing distances, daily. No bending, lifting, or twisting. Perform activities between shoulder and waist height (that is at counter height when standing or table height when sitting).     Allergies as of 08/08/2017   No Known Allergies     Medication List    TAKE these medications   cyclobenzaprine 10 MG tablet Commonly known as:  FLEXERIL Take 0.5-1 tablets (5-10 mg total) by mouth 3 (three) times daily as needed for muscle spasms.   HYDROcodone-acetaminophen 5-325 MG tablet Commonly known as:  NORCO/VICODIN Take 1-2 tablets by mouth every 4 (four) hours as needed for moderate pain.            Discharge Care Instructions        Start     Ordered   08/08/17 0000  cyclobenzaprine (FLEXERIL) 10 MG tablet  3 times daily PRN     08/08/17 0741   08/08/17 0000  HYDROcodone-acetaminophen (NORCO/VICODIN) 5-325 MG tablet  Every 4 hours PRN     08/08/17 0741   08/08/17 0000  Driving Restrictions    Comments:  No driving for 2 weeks. May ride in the car locally now. May begin to drive locally in 2 weeks.   08/08/17 0741   08/08/17 0000  Other Restrictions    Comments:  Walk gradually increasing distances out in the fresh air at least twice a day. Walking additional 6 times inside the house, gradually increasing distances, daily. No bending, lifting, or twisting. Perform activities between shoulder and waist height (that is at counter height when standing or table height when sitting).   08/08/17 4098  08/08/17 0000  Discharge wound care:    Comments:  Leave the wound open to air. Shower daily with the wound uncovered. Water and soapy water should run over the incision area. Do not wash directly on the incision for 2 weeks. Remove the glue after 2 weeks.   08/08/17 0741       Signed: Hewitt Shorts, MD 08/08/2017, 7:42 AM

## 2017-08-09 ENCOUNTER — Encounter (HOSPITAL_COMMUNITY): Payer: Self-pay | Admitting: Neurosurgery

## 2017-11-14 ENCOUNTER — Other Ambulatory Visit: Payer: Self-pay | Admitting: Neurosurgery

## 2017-11-30 NOTE — Pre-Procedure Instructions (Signed)
Joel HolsterMark S Bailey  11/30/2017      [facility] - [place] - [address] [address] [address] [address] [address] [place] [id number] Phone: [phone] Fax: [phone]    Your procedure is scheduled on Dec. 27  [facility] at 5:30 A.M.  Call this number if you have problems the morning of surgery:  [phone]   Remember:  Do not eat food or drink liquids after midnight on Dec. 26   Take these medicines the morning of surgery with A SIP OF WATER : none              7 days prior to surgery STOP taking any Aspirin(unless otherwise instructed by your surgeon), Aleve, Naproxen, Ibuprofen, Motrin, Advil, Goody's, BC's, all herbal medications, fish oil, and all vitamins   Do not wear jewelry.  Do not wear lotions, powders, or perfumes, or deodorant.  Do not shave 48 hours prior to surgery.  Men may shave face and neck.  Do not bring valuables to the hospital.  [facility] is not responsible for any belongings or valuables.  Contacts, dentures or bridgework may not be worn into surgery.  Leave your suitcase in the car.  After surgery it may be brought to your room.  For patients admitted to the hospital, discharge time will be determined by your treatment team.  Patients discharged the day of surgery will not be allowed to drive home.    Special instructions:  [place]- Preparing For Surgery  Before surgery, you can play an important role. Because skin is not sterile, your skin needs to be as free of germs as possible. You can reduce the number of germs on your skin by washing with CHG (chlorahexidine gluconate) Soap before surgery.  CHG is an antiseptic cleaner which kills germs and bonds with the skin to continue killing germs even after washing.  Please do not use if you have an allergy to CHG or antibacterial soaps. If your skin becomes reddened/irritated stop using the CHG.  Do not shave (including legs and  underarms) for at least 48 hours prior to first CHG shower. It is OK to shave your face.  Please follow these instructions carefully.   1. Shower the NIGHT BEFORE SURGERY and the MORNING OF SURGERY with CHG.   2. If you chose to wash your hair, wash your hair first as usual with your normal shampoo.  3. After you shampoo, rinse your hair and body thoroughly to remove the shampoo.  4. Use CHG as you would any other liquid soap. You can apply CHG directly to the skin and wash gently with a scrungie or a clean washcloth.   5. Apply the CHG Soap to your body ONLY FROM THE NECK DOWN.  Do not use on open wounds or open sores. Avoid contact with your eyes, ears, mouth and genitals (private parts). Wash Face and genitals (private parts)  with your normal soap.  6. Wash thoroughly, paying special attention to the area where your surgery will be performed.  7. Thoroughly rinse your body with warm water from the neck down.  8. DO NOT shower/wash with your normal soap after using and rinsing off the CHG Soap.  9. Pat yourself dry with a CLEAN TOWEL.  10. Wear CLEAN PAJAMAS to bed the night before surgery, wear comfortable clothes the morning of surgery  11. Place CLEAN SHEETS on your bed the night of your first shower  and DO NOT SLEEP WITH PETS.    Day of Surgery: Do not apply any deodorants/lotions. Please wear clean clothes to the hospital/surgery center.      Please read over the following fact sheets that you were given. Coughing and Deep Breathing, MRSA Information and Surgical Site Infection Prevention

## 2017-12-01 ENCOUNTER — Encounter (HOSPITAL_COMMUNITY)
Admission: RE | Admit: 2017-12-01 | Discharge: 2017-12-01 | Disposition: A | Discharge status: Home / Self Care | Payer: BLUE CROSS/BLUE SHIELD | Source: Ambulatory Visit | Attending: Neurosurgery | Admitting: Neurosurgery

## 2017-12-01 ENCOUNTER — Other Ambulatory Visit (HOSPITAL_COMMUNITY): Payer: Self-pay | Admitting: *Deleted

## 2017-12-01 ENCOUNTER — Other Ambulatory Visit: Payer: Self-pay

## 2017-12-01 ENCOUNTER — Encounter (HOSPITAL_COMMUNITY): Payer: Self-pay

## 2017-12-01 DIAGNOSIS — Z01812 Encounter for preprocedural laboratory examination: Secondary | ICD-10-CM | POA: Diagnosis present

## 2017-12-01 HISTORY — DX: Unspecified osteoarthritis, unspecified site: M19.90

## 2017-12-01 LAB — TYPE AND SCREEN
ABO/RH(D): A POS
Antibody Screen: NEGATIVE

## 2017-12-01 LAB — CBC
HCT: 43.3 % (ref 39.0–52.0)
Hemoglobin: 14.7 g/dL (ref 13.0–17.0)
MCH: 32.6 pg (ref 26.0–34.0)
MCHC: 33.9 g/dL (ref 30.0–36.0)
MCV: 96 fL (ref 78.0–100.0)
PLATELETS: 277 10*3/uL (ref 150–400)
RBC: 4.51 MIL/uL (ref 4.22–5.81)
RDW: 14.1 % (ref 11.5–15.5)
WBC: 16 10*3/uL — ABNORMAL HIGH (ref 4.0–10.5)

## 2017-12-01 LAB — SURGICAL PCR SCREEN
MRSA, PCR: NEGATIVE
Staphylococcus aureus: NEGATIVE

## 2017-12-01 NOTE — Progress Notes (Signed)
Denies any cardiac history,never seen a  Cardiologist or had any cardiac studies done.  Denies any chest pain or discomfort

## 2017-12-07 ENCOUNTER — Inpatient Hospital Stay (HOSPITAL_COMMUNITY): Payer: BLUE CROSS/BLUE SHIELD

## 2017-12-07 ENCOUNTER — Inpatient Hospital Stay (HOSPITAL_COMMUNITY)
Admission: RE | Admit: 2017-12-07 | Discharge: 2017-12-08 | DRG: 455 | Disposition: A | Discharge status: Home / Self Care | Payer: BLUE CROSS/BLUE SHIELD | Source: Ambulatory Visit | Attending: Neurosurgery | Admitting: Neurosurgery

## 2017-12-07 ENCOUNTER — Encounter (HOSPITAL_COMMUNITY): Payer: Self-pay

## 2017-12-07 ENCOUNTER — Inpatient Hospital Stay (HOSPITAL_COMMUNITY): Payer: BLUE CROSS/BLUE SHIELD | Admitting: Certified Registered"

## 2017-12-07 ENCOUNTER — Encounter (HOSPITAL_COMMUNITY)
Admission: RE | Disposition: A | Discharge status: Home / Self Care | Payer: Self-pay | Source: Ambulatory Visit | Attending: Neurosurgery

## 2017-12-07 DIAGNOSIS — F17210 Nicotine dependence, cigarettes, uncomplicated: Secondary | ICD-10-CM | POA: Diagnosis present

## 2017-12-07 DIAGNOSIS — M4726 Other spondylosis with radiculopathy, lumbar region: Secondary | ICD-10-CM | POA: Diagnosis present

## 2017-12-07 DIAGNOSIS — M48062 Spinal stenosis, lumbar region with neurogenic claudication: Secondary | ICD-10-CM | POA: Diagnosis present

## 2017-12-07 DIAGNOSIS — Z419 Encounter for procedure for purposes other than remedying health state, unspecified: Secondary | ICD-10-CM

## 2017-12-07 DIAGNOSIS — M545 Low back pain: Secondary | ICD-10-CM | POA: Diagnosis present

## 2017-12-07 DIAGNOSIS — M21371 Foot drop, right foot: Secondary | ICD-10-CM | POA: Diagnosis present

## 2017-12-07 DIAGNOSIS — M5116 Intervertebral disc disorders with radiculopathy, lumbar region: Secondary | ICD-10-CM | POA: Diagnosis present

## 2017-12-07 HISTORY — DX: Spinal stenosis, lumbar region with neurogenic claudication: M48.062

## 2017-12-07 SURGERY — POSTERIOR LUMBAR FUSION 2 LEVEL
Anesthesia: General | Site: Back

## 2017-12-07 MED ORDER — THROMBIN (RECOMBINANT) 20000 UNITS EX SOLR
CUTANEOUS | Status: DC | PRN
  Administered 2017-12-07 (×2): 20 mL via TOPICAL

## 2017-12-07 MED ORDER — BISACODYL 10 MG RE SUPP: 10.0000 mg | Freq: Every day | RECTAL | Status: DC | PRN

## 2017-12-07 MED ORDER — HYDROMORPHONE HCL 1 MG/ML IJ SOLN
INTRAMUSCULAR | Status: AC
  Administered 2017-12-07: 0.5 mg via INTRAVENOUS
  Filled 2017-12-07: qty 1

## 2017-12-07 MED ORDER — PHENYLEPHRINE HCL 10 MG/ML IJ SOLN
INTRAVENOUS | Status: DC | PRN
  Administered 2017-12-07: 20 ug/min via INTRAVENOUS

## 2017-12-07 MED ORDER — HYDROCODONE-ACETAMINOPHEN 5-325 MG PO TABS
1.0000 | ORAL_TABLET | ORAL | Status: DC | PRN
  Administered 2017-12-07 (×2): 2 via ORAL
  Administered 2017-12-08: 1 via ORAL
  Administered 2017-12-08 (×2): 2 via ORAL
  Filled 2017-12-07: qty 1
  Filled 2017-12-07 (×4): qty 2

## 2017-12-07 MED ORDER — THROMBIN (RECOMBINANT) 5000 UNITS EX SOLR
CUTANEOUS | Status: DC | PRN
  Administered 2017-12-07: 5 mL via TOPICAL

## 2017-12-07 MED ORDER — ONDANSETRON HCL 4 MG/2ML IJ SOLN
INTRAMUSCULAR | Status: DC | PRN
  Administered 2017-12-07: 4 mg via INTRAVENOUS

## 2017-12-07 MED ORDER — MAGNESIUM HYDROXIDE 400 MG/5ML PO SUSP: 30.0000 mL | Freq: Every day | ORAL | Status: DC | PRN

## 2017-12-07 MED ORDER — FLEET ENEMA 7-19 GM/118ML RE ENEM: 1.0000 | ENEMA | Freq: Once | RECTAL | Status: DC | PRN

## 2017-12-07 MED ORDER — ACETAMINOPHEN 650 MG RE SUPP: 650.0000 mg | RECTAL | Status: DC | PRN

## 2017-12-07 MED ORDER — THROMBIN (RECOMBINANT) 5000 UNITS EX SOLR
CUTANEOUS | Status: AC
  Filled 2017-12-07: qty 5000

## 2017-12-07 MED ORDER — KETOROLAC TROMETHAMINE 30 MG/ML IJ SOLN
30.0000 mg | Freq: Four times a day (QID) | INTRAMUSCULAR | Status: DC
  Administered 2017-12-07 – 2017-12-08 (×4): 30 mg via INTRAVENOUS
  Filled 2017-12-07 (×4): qty 1

## 2017-12-07 MED ORDER — 0.9 % SODIUM CHLORIDE (POUR BTL) OPTIME
TOPICAL | Status: DC | PRN
  Administered 2017-12-07 (×3): 1000 mL

## 2017-12-07 MED ORDER — CEFAZOLIN SODIUM-DEXTROSE 2-4 GM/100ML-% IV SOLN
2.0000 g | INTRAVENOUS | Status: AC
  Administered 2017-12-07 (×2): 2 g via INTRAVENOUS

## 2017-12-07 MED ORDER — HYDROXYZINE HCL 25 MG PO TABS: 50.0000 mg | ORAL_TABLET | ORAL | Status: DC | PRN

## 2017-12-07 MED ORDER — HYDROXYZINE HCL 50 MG/ML IM SOLN: 50.0000 mg | INTRAMUSCULAR | Status: DC | PRN

## 2017-12-07 MED ORDER — KETOROLAC TROMETHAMINE 30 MG/ML IJ SOLN
30.0000 mg | Freq: Once | INTRAMUSCULAR | Status: AC
  Administered 2017-12-07: 30 mg via INTRAVENOUS

## 2017-12-07 MED ORDER — THROMBIN (RECOMBINANT) 20000 UNITS EX SOLR
CUTANEOUS | Status: AC
  Filled 2017-12-07: qty 20000

## 2017-12-07 MED ORDER — MENTHOL 3 MG MT LOZG: 1.0000 | LOZENGE | OROMUCOSAL | Status: DC | PRN

## 2017-12-07 MED ORDER — CHLORHEXIDINE GLUCONATE CLOTH 2 % EX PADS: 6.0000 | MEDICATED_PAD | Freq: Once | CUTANEOUS | Status: DC

## 2017-12-07 MED ORDER — PROPOFOL 10 MG/ML IV BOLUS
INTRAVENOUS | Status: AC
Start: 2017-12-07 — End: 2017-12-07
  Filled 2017-12-07: qty 20

## 2017-12-07 MED ORDER — OXYCODONE HCL 5 MG PO TABS: 5.0000 mg | ORAL_TABLET | Freq: Once | ORAL | Status: DC | PRN

## 2017-12-07 MED ORDER — ACETAMINOPHEN 325 MG PO TABS: 650.0000 mg | ORAL_TABLET | ORAL | Status: DC | PRN

## 2017-12-07 MED ORDER — LIDOCAINE-EPINEPHRINE 1 %-1:100000 IJ SOLN
INTRAMUSCULAR | Status: DC | PRN
  Administered 2017-12-07: 15 mL

## 2017-12-07 MED ORDER — PHENOL 1.4 % MT LIQD: 1.0000 | OROMUCOSAL | Status: DC | PRN

## 2017-12-07 MED ORDER — SODIUM CHLORIDE 0.9 % IV SOLN: 250.0000 mL | INTRAVENOUS | Status: DC

## 2017-12-07 MED ORDER — FENTANYL CITRATE (PF) 250 MCG/5ML IJ SOLN
INTRAMUSCULAR | Status: AC
  Filled 2017-12-07: qty 5

## 2017-12-07 MED ORDER — PROPOFOL 10 MG/ML IV BOLUS
INTRAVENOUS | Status: DC | PRN
  Administered 2017-12-07: 170 mg via INTRAVENOUS

## 2017-12-07 MED ORDER — PHENYLEPHRINE HCL 10 MG/ML IJ SOLN
INTRAMUSCULAR | Status: DC | PRN
  Administered 2017-12-07: 80 ug via INTRAVENOUS

## 2017-12-07 MED ORDER — SODIUM CHLORIDE 0.9 % IR SOLN
Status: DC | PRN
  Administered 2017-12-07 (×2): 500 mL

## 2017-12-07 MED ORDER — MIDAZOLAM HCL 2 MG/2ML IJ SOLN
INTRAMUSCULAR | Status: AC
  Filled 2017-12-07: qty 2

## 2017-12-07 MED ORDER — ALUM & MAG HYDROXIDE-SIMETH 200-200-20 MG/5ML PO SUSP
30.0000 mL | Freq: Four times a day (QID) | ORAL | Status: DC | PRN
  Filled 2017-12-07: qty 30

## 2017-12-07 MED ORDER — ONDANSETRON HCL 4 MG PO TABS: 4.0000 mg | ORAL_TABLET | Freq: Four times a day (QID) | ORAL | Status: DC | PRN

## 2017-12-07 MED ORDER — CYCLOBENZAPRINE HCL 5 MG PO TABS
5.0000 mg | ORAL_TABLET | Freq: Three times a day (TID) | ORAL | Status: DC | PRN
Start: 2017-12-07 — End: 2017-12-08
  Administered 2017-12-07: 5 mg via ORAL
  Filled 2017-12-07: qty 1

## 2017-12-07 MED ORDER — LIDOCAINE HCL (CARDIAC) 20 MG/ML IV SOLN
INTRAVENOUS | Status: DC | PRN
  Administered 2017-12-07: 80 mg via INTRAVENOUS

## 2017-12-07 MED ORDER — MORPHINE SULFATE (PF) 4 MG/ML IV SOLN: 4.0000 mg | INTRAVENOUS | Status: DC | PRN

## 2017-12-07 MED ORDER — SODIUM CHLORIDE 0.9% FLUSH: 3.0000 mL | INTRAVENOUS | Status: DC | PRN

## 2017-12-07 MED ORDER — ACETAMINOPHEN 10 MG/ML IV SOLN
INTRAVENOUS | Status: DC | PRN
  Administered 2017-12-07: 1000 mg via INTRAVENOUS

## 2017-12-07 MED ORDER — EPHEDRINE SULFATE 50 MG/ML IJ SOLN
INTRAMUSCULAR | Status: DC | PRN
  Administered 2017-12-07 (×5): 5 mg via INTRAVENOUS

## 2017-12-07 MED ORDER — BUPIVACAINE HCL (PF) 0.5 % IJ SOLN
INTRAMUSCULAR | Status: DC | PRN
  Administered 2017-12-07: 15 mL

## 2017-12-07 MED ORDER — LIDOCAINE-EPINEPHRINE 1 %-1:100000 IJ SOLN
INTRAMUSCULAR | Status: AC
  Filled 2017-12-07: qty 1

## 2017-12-07 MED ORDER — OXYCODONE HCL 5 MG/5ML PO SOLN: 5.0000 mg | Freq: Once | ORAL | Status: DC | PRN

## 2017-12-07 MED ORDER — BUPIVACAINE HCL (PF) 0.5 % IJ SOLN
INTRAMUSCULAR | Status: AC
  Filled 2017-12-07: qty 30

## 2017-12-07 MED ORDER — KETOROLAC TROMETHAMINE 30 MG/ML IJ SOLN
INTRAMUSCULAR | Status: AC
  Administered 2017-12-07: 30 mg via INTRAVENOUS
  Filled 2017-12-07: qty 1

## 2017-12-07 MED ORDER — HYDROMORPHONE HCL 1 MG/ML IJ SOLN
0.2500 mg | INTRAMUSCULAR | Status: DC | PRN
  Administered 2017-12-07 (×2): 0.5 mg via INTRAVENOUS

## 2017-12-07 MED ORDER — SUGAMMADEX SODIUM 200 MG/2ML IV SOLN
INTRAVENOUS | Status: DC | PRN
  Administered 2017-12-07: 150 mg via INTRAVENOUS

## 2017-12-07 MED ORDER — LACTATED RINGERS IV SOLN
INTRAVENOUS | Status: DC | PRN
  Administered 2017-12-07 (×4): via INTRAVENOUS

## 2017-12-07 MED ORDER — ONDANSETRON HCL 4 MG/2ML IJ SOLN: 4.0000 mg | Freq: Four times a day (QID) | INTRAMUSCULAR | Status: DC | PRN

## 2017-12-07 MED ORDER — CEFAZOLIN SODIUM-DEXTROSE 2-4 GM/100ML-% IV SOLN
INTRAVENOUS | Status: AC
  Filled 2017-12-07: qty 100

## 2017-12-07 MED ORDER — KCL IN DEXTROSE-NACL 20-5-0.45 MEQ/L-%-% IV SOLN: INTRAVENOUS | Status: DC

## 2017-12-07 MED ORDER — SODIUM CHLORIDE 0.9% FLUSH: 3.0000 mL | Freq: Two times a day (BID) | INTRAVENOUS | Status: DC

## 2017-12-07 MED ORDER — ROCURONIUM BROMIDE 100 MG/10ML IV SOLN
INTRAVENOUS | Status: DC | PRN
  Administered 2017-12-07: 10 mg via INTRAVENOUS
  Administered 2017-12-07: 30 mg via INTRAVENOUS
  Administered 2017-12-07: 20 mg via INTRAVENOUS
  Administered 2017-12-07: 30 mg via INTRAVENOUS
  Administered 2017-12-07: 10 mg via INTRAVENOUS
  Administered 2017-12-07: 30 mg via INTRAVENOUS
  Administered 2017-12-07: 50 mg via INTRAVENOUS

## 2017-12-07 MED ORDER — DEXAMETHASONE SODIUM PHOSPHATE 10 MG/ML IJ SOLN
INTRAMUSCULAR | Status: DC | PRN
  Administered 2017-12-07: 10 mg via INTRAVENOUS

## 2017-12-07 MED ORDER — MIDAZOLAM HCL 5 MG/5ML IJ SOLN
INTRAMUSCULAR | Status: DC | PRN
  Administered 2017-12-07: 2 mg via INTRAVENOUS

## 2017-12-07 MED ORDER — ACETAMINOPHEN 10 MG/ML IV SOLN
INTRAVENOUS | Status: AC
  Filled 2017-12-07: qty 100

## 2017-12-07 MED ORDER — FENTANYL CITRATE (PF) 100 MCG/2ML IJ SOLN
INTRAMUSCULAR | Status: DC | PRN
  Administered 2017-12-07: 50 ug via INTRAVENOUS
  Administered 2017-12-07: 100 ug via INTRAVENOUS
  Administered 2017-12-07 (×3): 50 ug via INTRAVENOUS

## 2017-12-07 SURGICAL SUPPLY — 86 items
ADH SKN CLS APL DERMABOND .7 (GAUZE/BANDAGES/DRESSINGS) ×1
APL SKNCLS STERI-STRIP NONHPOA (GAUZE/BANDAGES/DRESSINGS) ×1
BAG DECANTER FOR FLEXI CONT (MISCELLANEOUS) ×2 IMPLANT
BENZOIN TINCTURE PRP APPL 2/3 (GAUZE/BANDAGES/DRESSINGS) ×1 IMPLANT
BLADE CLIPPER SURG (BLADE) ×1 IMPLANT
BUR ACRON 5.0MM COATED (BURR) ×2 IMPLANT
BUR MATCHSTICK NEURO 3.0 LAGG (BURR) ×2 IMPLANT
CANISTER SUCT 3000ML PPV (MISCELLANEOUS) ×2 IMPLANT
CAP LCK SPNE (Orthopedic Implant) ×6 IMPLANT
CAP LOCK SPINE RADIUS (Orthopedic Implant) IMPLANT
CAP LOCKING (Orthopedic Implant) ×12 IMPLANT
CARTRIDGE OIL MAESTRO DRILL (MISCELLANEOUS) ×1 IMPLANT
CLSR STERI-STRIP ANTIMIC 1/2X4 (GAUZE/BANDAGES/DRESSINGS) ×2 IMPLANT
CONT SPEC 4OZ CLIKSEAL STRL BL (MISCELLANEOUS) ×2 IMPLANT
COVER BACK TABLE 60X90IN (DRAPES) ×2 IMPLANT
DECANTER SPIKE VIAL GLASS SM (MISCELLANEOUS) ×2 IMPLANT
DERMABOND ADVANCED (GAUZE/BANDAGES/DRESSINGS) ×1
DERMABOND ADVANCED .7 DNX12 (GAUZE/BANDAGES/DRESSINGS) ×1 IMPLANT
DIFFUSER DRILL AIR PNEUMATIC (MISCELLANEOUS) ×2 IMPLANT
DRAPE C-ARM 42X72 X-RAY (DRAPES) ×3 IMPLANT
DRAPE C-ARMOR (DRAPES) ×1 IMPLANT
DRAPE HALF SHEET 40X57 (DRAPES) IMPLANT
DRAPE LAPAROTOMY 100X72X124 (DRAPES) ×2 IMPLANT
DRAPE POUCH INSTRU U-SHP 10X18 (DRAPES) ×2 IMPLANT
ELECT REM PT RETURN 9FT ADLT (ELECTROSURGICAL) ×2
ELECTRODE REM PT RTRN 9FT ADLT (ELECTROSURGICAL) ×1 IMPLANT
GAUZE SPONGE 4X4 12PLY STRL (GAUZE/BANDAGES/DRESSINGS) ×2 IMPLANT
GAUZE SPONGE 4X4 12PLY STRL LF (GAUZE/BANDAGES/DRESSINGS) ×1 IMPLANT
GAUZE SPONGE 4X4 16PLY XRAY LF (GAUZE/BANDAGES/DRESSINGS) ×1 IMPLANT
GLOVE BIO SURGEON STRL SZ8 (GLOVE) ×1 IMPLANT
GLOVE BIOGEL PI IND STRL 6.5 (GLOVE) IMPLANT
GLOVE BIOGEL PI IND STRL 8 (GLOVE) ×2 IMPLANT
GLOVE BIOGEL PI IND STRL 8.5 (GLOVE) IMPLANT
GLOVE BIOGEL PI INDICATOR 6.5 (GLOVE) ×2
GLOVE BIOGEL PI INDICATOR 8 (GLOVE) ×2
GLOVE BIOGEL PI INDICATOR 8.5 (GLOVE) ×1
GLOVE ECLIPSE 7.5 STRL STRAW (GLOVE) ×5 IMPLANT
GLOVE SURG SS PI 6.5 STRL IVOR (GLOVE) ×4 IMPLANT
GOWN STRL REUS W/ TWL LRG LVL3 (GOWN DISPOSABLE) IMPLANT
GOWN STRL REUS W/ TWL XL LVL3 (GOWN DISPOSABLE) ×2 IMPLANT
GOWN STRL REUS W/TWL 2XL LVL3 (GOWN DISPOSABLE) IMPLANT
GOWN STRL REUS W/TWL LRG LVL3 (GOWN DISPOSABLE) ×4
GOWN STRL REUS W/TWL XL LVL3 (GOWN DISPOSABLE) ×4
KIT BASIN OR (CUSTOM PROCEDURE TRAY) ×2 IMPLANT
KIT INFUSE SMALL (Orthopedic Implant) ×1 IMPLANT
KIT ROOM TURNOVER OR (KITS) ×2 IMPLANT
MILL MEDIUM DISP (BLADE) ×1 IMPLANT
NDL 18GX1X1/2 (RX/OR ONLY) (NEEDLE) ×1 IMPLANT
NDL ASP BONE MRW 8GX15 (NEEDLE) IMPLANT
NDL SPNL 18GX3.5 QUINCKE PK (NEEDLE) ×1 IMPLANT
NDL SPNL 22GX3.5 QUINCKE BK (NEEDLE) ×1 IMPLANT
NEEDLE 18GX1X1/2 (RX/OR ONLY) (NEEDLE) ×2 IMPLANT
NEEDLE ASP BONE MRW 8GX15 (NEEDLE) ×2 IMPLANT
NEEDLE SPNL 18GX3.5 QUINCKE PK (NEEDLE) ×2 IMPLANT
NEEDLE SPNL 22GX3.5 QUINCKE BK (NEEDLE) ×4 IMPLANT
NS IRRIG 1000ML POUR BTL (IV SOLUTION) ×4 IMPLANT
OIL CARTRIDGE MAESTRO DRILL (MISCELLANEOUS) ×2
PACK LAMINECTOMY NEURO (CUSTOM PROCEDURE TRAY) ×2 IMPLANT
PAD ARMBOARD 7.5X6 YLW CONV (MISCELLANEOUS) ×7 IMPLANT
PATTIES SURGICAL .5 X.5 (GAUZE/BANDAGES/DRESSINGS) IMPLANT
PATTIES SURGICAL .5 X1 (DISPOSABLE) IMPLANT
PATTIES SURGICAL 1X1 (DISPOSABLE) ×1 IMPLANT
PEEK TLIF AVS 10X30X4 (Peek) ×1 IMPLANT
ROD MAX 50MM (Rod) ×2 IMPLANT
SCREW 5.75X40M (Screw) ×2 IMPLANT
SCREW 5.75X45MM (Screw) ×4 IMPLANT
SPACER SPINAL 8X25X4 8D PEEK (Spacer) ×2 IMPLANT
SPONGE LAP 4X18 X RAY DECT (DISPOSABLE) IMPLANT
SPONGE NEURO XRAY DETECT 1X3 (DISPOSABLE) ×1 IMPLANT
SPONGE SURGIFOAM ABS GEL 100 (HEMOSTASIS) ×1 IMPLANT
STAPLER SKIN PROX WIDE 3.9 (STAPLE) ×1 IMPLANT
STRIP BIOACTIVE VITOSS 25X100X (Neuro Prosthesis/Implant) ×2 IMPLANT
STRIP CLOSURE SKIN 1/2X4 (GAUZE/BANDAGES/DRESSINGS) ×2 IMPLANT
SUT PROLENE 6 0 BV (SUTURE) IMPLANT
SUT VIC AB 1 CT1 18XBRD ANBCTR (SUTURE) ×2 IMPLANT
SUT VIC AB 1 CT1 8-18 (SUTURE) ×6
SUT VIC AB 2-0 CP2 18 (SUTURE) ×5 IMPLANT
SYR 3ML LL SCALE MARK (SYRINGE) ×4 IMPLANT
SYR 5ML LL (SYRINGE) IMPLANT
SYR CONTROL 10ML LL (SYRINGE) ×2 IMPLANT
TAPE CLOTH SURG 4X10 WHT LF (GAUZE/BANDAGES/DRESSINGS) ×1 IMPLANT
TOWEL GREEN STERILE (TOWEL DISPOSABLE) ×2 IMPLANT
TOWEL GREEN STERILE FF (TOWEL DISPOSABLE) ×2 IMPLANT
TRAP SPECIMEN MUCOUS 40CC (MISCELLANEOUS) IMPLANT
TRAY FOLEY W/METER SILVER 16FR (SET/KITS/TRAYS/PACK) ×2 IMPLANT
WATER STERILE IRR 1000ML POUR (IV SOLUTION) ×2 IMPLANT

## 2017-12-07 NOTE — Transfer of Care (Signed)
Immediate Anesthesia Transfer of Care Note  Patient: Sharee HolsterMark S Minasyan  Procedure(s) Performed: LUMBAR FOUR- LUMBAR FIVE AND LUMBAR FIVE- SACRAL ONE LUMBAR DECOMPRESSION,POSTERIOR LUMBAR INTERBODY FUSION,POSTERIOR LATERAL  ARTHRODESIS (N/A Back)  Patient Location: PACU  Anesthesia Type:General  Level of Consciousness: awake, alert  and oriented  Airway & Oxygen Therapy: Patient Spontanous Breathing and Patient connected to nasal cannula oxygen  Post-op Assessment: Report given to RN, Post -op Vital signs reviewed and stable and Patient moving all extremities X 4  Post vital signs: Reviewed and stable  Last Vitals:  Vitals:   12/07/17 0541 12/07/17 1410  BP: 116/86 110/74  Pulse: 63 (!) 102  Resp: 20 18  Temp: 36.6 C 36.7 C  SpO2: 100% 100%    Last Pain:  Vitals:   12/07/17 0555  TempSrc:   PainSc: 6          Complications: No apparent anesthesia complications

## 2017-12-07 NOTE — Anesthesia Preprocedure Evaluation (Signed)
Anesthesia Evaluation  Patient identified by MRN, date of birth, ID band Patient awake    Reviewed: Allergy & Precautions, NPO status , Patient's Chart, lab work & pertinent test results  Airway Mallampati: II  TM Distance: >3 FB Neck ROM: Full    Dental no notable dental hx.    Pulmonary Current Smoker,    breath sounds clear to auscultation       Cardiovascular negative cardio ROS   Rhythm:Regular Rate:Normal     Neuro/Psych Back pain     GI/Hepatic negative GI ROS, Neg liver ROS,   Endo/Other  negative endocrine ROS  Renal/GU negative Renal ROS     Musculoskeletal  (+) Arthritis ,   Abdominal   Peds  Hematology negative hematology ROS (+)   Anesthesia Other Findings   Reproductive/Obstetrics                             Anesthesia Physical Anesthesia Plan  ASA: II  Anesthesia Plan: General   Post-op Pain Management:    Induction: Intravenous  PONV Risk Score and Plan: 2 and Treatment may vary due to age or medical condition, Dexamethasone, Ondansetron and Midazolam  Airway Management Planned: Oral ETT  Additional Equipment:   Intra-op Plan:   Post-operative Plan: Extubation in OR  Informed Consent: I have reviewed the patients History and Physical, chart, labs and discussed the procedure including the risks, benefits and alternatives for the proposed anesthesia with the patient or authorized representative who has indicated his/her understanding and acceptance.   Dental advisory given  Plan Discussed with: CRNA  Anesthesia Plan Comments:         Anesthesia Quick Evaluation

## 2017-12-07 NOTE — Progress Notes (Signed)
Vitals:   12/07/17 1425 12/07/17 1440 12/07/17 1455 12/07/17 1518  BP: 110/81 107/67 99/66 95/79   Pulse: 96 95 96 94  Resp: 16 15 10 18   Temp:   98.3 F (36.8 C) 97.6 F (36.4 C)  TempSrc:      SpO2: 94% 97% 95% 95%  Weight:        Vision resting comfortably in bed. He feels the radicular pain is better, the strength is better, but he has numbness to the right lower extremity. Dressing is clean and dry. Foley remains to straight drainage.  Plan: Encouraged to ambulate several times this afternoon and evening. Nursing to DC Foley and monitor voiding function once up and ambulate in. We'll continue to progress through postoperative recovery.  Hewitt ShortsNUDELMAN,ROBERT W, MD 12/07/2017, 4:03 PM

## 2017-12-07 NOTE — Op Note (Signed)
12/07/2017  1:59 PM  PATIENT:  Joel Bailey  61 y.o. 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  PROCEDURE:  L4-5 and L5-S1 lumbar decompression including bilateral L4, L5, and S1 decompressive lumbar laminectomy, bilateral L4-5 and L5-S1 facetectomy, bilateral L4, L5, and S1 foraminotomies for decompression of central canal, lateral recess, and neural foraminal stenosis, with decompression beyond that required for interbody arthrodesis; L5-S1 transverse posterior lumbar interbody arthrodesis via left sided approach with AVS peek interbody implant, Vitoss BA with bone marrow aspirate, and infuse; bilateral L4-5 posterior lumbar interbody arthrodesis with AVS peek interbody implants, Vitoss BA with bone marrow aspirate, and infuse; bilateral L4-S1 posterior lateral arthrodesis with segmental radius posterior instrumentation, locally harvested morselized autograft, Vitoss BA with bone marrow aspirate, and infuse  SURGEON: Shirlean Kellyobert Nudelman, MD  ASSISTANTS: Maeola HarmanJoseph Stern, MD  ANESTHESIA:   general  EBL:  Total I/O In: 2150 [I.V.:2000; Blood:150] Out: 1345 [Urine:895; Blood:450]  BLOOD ADMINISTERED:150 CC CELLSAVER  COUNT:  Correct per nursing staff  DICTATION: Patient was brought to the operating room placed under general endotracheal anesthesia. The patient was turned to prone position, the lumbar region was prepped with Betadine soap and solution and draped in a sterile fashion. The midline was infiltrated with local anesthesia with epinephrine. A localizing x-ray was taken and then a midline incision was made through the previous midline incision, and  extended rostrally.  Dissection was carried down through the subcutaneous tissue, bipolar cautery and electrocautery were used to maintain hemostasis. Dissection was carried down to the lumbar fascia. The fascia was incised bilaterally and the paraspinal muscles were dissected with a spinous process and lamina in a subperiosteal fashion. Another x-ray was taken for localization and the L4-5 and L5-S1 levels were localized.  Careful dissection was done around the previous right L5-S1 laminotomy.  Dissection was then carried out laterally over the facet complexes and the transverse processes of L4 and L5, as well as the ala of S1, were exposed and decorticated.   We then proceeded with an inferior L4, complete L5, and superior S1 laminectomy using double-action rongeurs, the high-speed drill, and Kerrison punches.  Bone was saved, and cleaned of soft tissue, and run through a bone mill, to be later used as locally harvested morselized autograft.  Particular care was taken around the previous right L5-S1 laminotomy.  Epidural scar tissue was freed up from the margins of the laminotomy.  Good decompression of the central canal and lateral recess stenosis was achieved.  Dissection was then carried out laterally including facetectomy and foraminotomies with decompression of the stenotic compression of the L4, L5, S1 nerve roots bilaterally.   As we decompressed the right L5-S1 neural foramen via the facetectomy and foraminotomy, we found that the patient had a conjoined nerve root involving the right L5 and S1 nerve roots.  The foraminotomy was extended fully through the foramen from medial to lateral, and good decompression of the conjoined nerve root was achieved.  Once the decompression of the stenotic compression of the thecal sac and exiting nerve roots was completed we proceeded with the posterior lumbar interbody arthrodesis.  We converted the L5-S1 interbody arthrodesis to a left L5-S1 TPLIF.  The annulus  was incised bilaterally at the L4-5 level, and on  the left side at the L5-S1 level. The disc space was entered and a thorough discectomy was performed using pituitary rongeurs and curettes. Once the discectomy was completed we began to prepare the endplate surfaces, removing the cartilaginous endplates surface. We then measured the height of the intervertebral disc space. We selected 8 x 25 x 4 PL AVS peek interbody implants for the L4-5 level, and 10 x 30 x 4 TL AVS peek interbody implant for the L5-S1 level.  The C-arm fluoroscope was then draped and brought in the field and we identified the pedicle entry points bilaterally at the L4, L5, S1 levels. Each of the 6 pedicles was probed, we aspirated bone marrow aspirate from the vertebral bodies, this was injected over two 10 cc strips of Vitoss BA. Then each of the pedicles was examined with the ball probe, good bony surfaces were found and no bony cuts were found. Each of the pedicles was then tapped with a 5.25 mm tap, again examined with the ball probe good threading was found and no bony cuts were found. We then placed 5.75 by 45 millimeter screws bilaterally at the L4 level, 5.75 by 45 millimeter screws bilaterally at the L5 level, and 5.75 by 40 millimeter screws bilaterally at the S1 level.  We then packed the AVS peek interbody implants with Vitoss BA with bone marrow aspirate and infuse.  At L5-S1 Vitoss BA was packed on the far right side of the disc space, and then we placed the TL implant from the left side into the disc space, carefully retracting the thecal sac and nerve root medially.  It was gently tamped into a transverse position at the L5-S1 level. We then packed the disc space with additional Vitoss BA with bone marrow aspirate and infuse.  At the L4-5 level, we placed the first implant on the right side, carefully retracting the thecal sac and nerve root medially. We then went back to the left side and packed the midline with additional  Vitoss BA with bone marrow aspirate and infuse, and then placed a second implant on the left side, again retracting the thecal sac and nerve root medially.   We then packed the lateral gutter over the transverse processes and intertransverse space with locally harvested morselized autograft, Vitoss BA with bone marrow aspirate, and infuse. We then selected pre-hyperlordosed 50 mm rods, they were placed within the screw heads and secured with locking caps once all 6 locking caps were placed final tightening was performed against a counter torque.  The wound had been irrigated multiple times during the procedure with saline solution and bacitracin solution, good hemostasis was established with a combination of bipolar cautery, Gelfoam with thrombin, and Surgifoam. Once good hemostasis was confirmed we proceeded with closure paraspinal muscles and deep fascia were closed with interrupted undyed 1 Vicryl sutures, the Scarpa's fascia and the subcutaneous and subcuticular closed in separate layers with interrupted inverted 2-0 undyed Vicryl sutures the skin edges were approximated with Dermabond.  The wound was dressed with sterile gauze and Hypafix.  Following surgery the patient was turned back to the supine position to be reversed and the anesthetic extubated and transferred to the recovery room for further care.   PLAN OF CARE: Admit to inpatient   PATIENT DISPOSITION:  PACU - hemodynamically stable.   Delay start of Pharmacological VTE agent (>24hrs) due to surgical blood loss or risk of bleeding:  yes

## 2017-12-07 NOTE — Anesthesia Postprocedure Evaluation (Signed)
Anesthesia Post Note  Patient: Sharee HolsterMark S Wiemers  Procedure(s) Performed: LUMBAR FOUR- LUMBAR FIVE AND LUMBAR FIVE- SACRAL ONE LUMBAR DECOMPRESSION,POSTERIOR LUMBAR INTERBODY FUSION,POSTERIOR LATERAL  ARTHRODESIS (N/A Back)     Patient location during evaluation: PACU Anesthesia Type: General Level of consciousness: awake and sedated Pain management: pain level controlled Vital Signs Assessment: post-procedure vital signs reviewed and stable Respiratory status: spontaneous breathing, nonlabored ventilation, respiratory function stable and patient connected to nasal cannula oxygen Cardiovascular status: blood pressure returned to baseline and stable Postop Assessment: no apparent nausea or vomiting Anesthetic complications: no    Last Vitals:  Vitals:   12/07/17 1425 12/07/17 1440  BP: 110/81 107/67  Pulse: 96 95  Resp: 16 15  Temp:    SpO2: 94% 97%    Last Pain:  Vitals:   12/07/17 1436  TempSrc:   PainSc: 6     LLE Motor Response: Purposeful movement;Responds to commands (12/07/17 1440) LLE Sensation: Full sensation (12/07/17 1440) RLE Motor Response: Purposeful movement;Responds to commands (12/07/17 1440) RLE Sensation: Numbness(pre-op as well) (12/07/17 1440)      Micheal Murad,JAMES TERRILL

## 2017-12-07 NOTE — H&P (Signed)
Subjective: Patient is a 61 y.o.right-handed white male who is admitted for treatment of Lumbar stenosis with disabling lumbar radiculopathy and foot drop. Patient is most recently 4 months status post a 4 level ACDF for cervical myeloradiculopathy. Prior to that he underwent a right L5-S1 lumbar laminotomy and foraminotomy for acute right lumbar radiculopathy with foot drop. Patient's had increasing difficulties with right lumbar radiculopathy and progressive weakness in his right foot.Workup shows multilevel degeneration throughout the lumbar spine with moderate canal stenosis at L4-5 due to circumferential disc bulge, ligamentum flavum thickening, and facet hypertrophy with marked neural foraminal stenosis right worse than left. The worst degenerative changes there are at the L5-S1 level with circumferential disc bulging, ligament flavum thickening and facet hypertrophy and marked neural foraminal stenosis again right worse than left. The worst stenosis and impingement is seen in the right L5-S1 neural foramen.  Patient is admitted now for an L4-S1 decompressive lumbar laminectomy with bilateral facetectomies and foraminotomies and bilateral L4-5 and L5-S1 stabilization via posterior lumbar interbody arthrodesis with interbody implants and bone graft and a bilateral posterior lateral arthrodesis with posterior instrumentation and bone graft.   Patient Active Problem List   Diagnosis Date Noted  . HNP (herniated nucleus pulposus) with myelopathy, cervical 08/07/2017   Past Medical History:  Diagnosis Date  . Arthritis     Past Surgical History:  Procedure Laterality Date  . ANTERIOR CERVICAL DECOMPRESSION/DISCECTOMY FUSION 4 LEVELS N/A 08/07/2017   Procedure: ANTERIOR CERVICAL DECOMPRESSION/DISCECTOMY FUSION CERVICAL 3- CERVICAL 4, CERVICAL 4 - CERVICAL 5, CERVICAL 5- CERVICAL 6, CERVICAL 6- CERVICAL 7;  Surgeon: Shirlean KellyNudelman, Robert, MD;  [facility];  Service: Neurosurgery;  Laterality: N/A;  .  BACK SURGERY  2017    Medications Prior to Admission  Medication Sig Dispense Refill Last Dose  . cyclobenzaprine (FLEXERIL) 10 MG tablet Take 0.5-1 tablets (5-10 mg total) by mouth 3 (three) times daily as needed for muscle spasms. (Patient not taking: Reported on 11/24/2017) 50 tablet 0 Completed Course at Unknown time  . HYDROcodone-acetaminophen (NORCO/VICODIN) 5-325 MG tablet Take 1-2 tablets by mouth every 4 (four) hours as needed for moderate pain. (Patient not taking: Reported on 11/24/2017) 50 tablet 0 Completed Course at Unknown time   No Known Allergies  Social History   Tobacco Use  . Smoking status: Current Every Day Smoker    Packs/day: 0.50    Years: 37.00    Pack years: 18.50    Types: Cigarettes  . Smokeless tobacco: Never Used  Substance Use Topics  . Alcohol use: No    History reviewed. No pertinent family history.   Review of Systems A comprehensive review of systems was negative.  Objective: Vital signs in last 24 hours: Temp:  [97.8 F (36.6 C)] 97.8 F (36.6 C) (12/27 0541) Pulse Rate:  [63] 63 (12/27 0541) Resp:  [20] 20 (12/27 0541) BP: (116)/(86) 116/86 (12/27 0541) SpO2:  [100 %] 100 % (12/27 0541) Weight:  [64.9 kg (143 lb)] 64.9 kg (143 lb) (12/27 0541)  EXAM: Patient is a well-developed well-nourished white male in no acute distress. Lungs are clear to auscultation , the patient has symmetrical respiratory excursion. Heart has a regular rate and rhythm normal S1 and S2 no murmur.   Abdomen is soft nontender nondistended bowel sounds are present. Extremity examination shows no clubbing cyanosis or edema. Motor exam shows the iliopsoas and quadriceps are 5 bilaterally. The right dorsiflexor is 3-4 minus the right EHL is 01 the right plantar flexor is 4 minus.  The left dorsiflexor, EHL, plantar flexor are 5.Sensation is intact to pinprick in the distal lower extremities. Reflexes show the left quadriceps is minimal, the right quadriceps is 2. Left  gastrocnemius minimal, right gastrocnemius is absent. Toes are downgoing bilaterally. Gait and stance favor the right lower extremity.   Data Review:CBC    Component Value Date/Time   WBC 16.0 (H) 12/01/2017 1326   RBC 4.51 12/01/2017 1326   HGB 14.7 12/01/2017 1326   HCT 43.3 12/01/2017 1326   PLT 277 12/01/2017 1326   MCV 96.0 12/01/2017 1326   MCH 32.6 12/01/2017 1326   MCHC 33.9 12/01/2017 1326   RDW 14.1 12/01/2017 1326                          BMET    Component Value Date/Time   NA 141 08/02/2017 1448   K 4.2 08/02/2017 1448   CL 108 08/02/2017 1448   CO2 25 08/02/2017 1448   GLUCOSE 109 (H) 08/02/2017 1448   BUN 11 08/02/2017 1448   CREATININE 1.07 08/02/2017 1448   CALCIUM 9.1 08/02/2017 1448   GFRNONAA >60 08/02/2017 1448   GFRAA >60 08/02/2017 1448     Assessment/Plan: Patient with progressively worsening right lumbar radiculopathy and foot drop, with advanced degeneration at the L4-5 and L5-S1 levels who is admitted now for decompression and arthrodesis.  I've discussed with the patient the nature of his condition, the nature the surgical procedure, the typical length of surgery, hospital stay, and overall recuperation, the limitations postoperatively, and risks of surgery. I discussed risks including risks of infection, bleeding, possibly need for transfusion, the risk of nerve root dysfunction with pain, weakness, numbness, or paresthesias, the risk of dural tear and CSF leakage and possible need for further surgery, the risk of failure of the arthrodesis and possibly for further surgery, the risk of anesthetic complications including myocardial infarction, stroke, pneumonia, and death. We discussed the need for postoperative immobilization in a lumbar brace. Understanding all this the patient does wish to proceed with surgery and is admitted for such.     Hewitt ShortsNUDELMAN,ROBERT W, MD 12/07/2017 7:15 AM

## 2017-12-07 NOTE — Anesthesia Procedure Notes (Signed)
Procedure Name: Intubation Date/Time: 12/07/2017 7:29 AM Performed by: Lanell MatarBaker, Sebastyan Snodgrass M, CRNA Pre-anesthesia Checklist: Patient identified, Emergency Drugs available, Suction available and Patient being monitored Patient Re-evaluated:Patient Re-evaluated prior to induction Oxygen Delivery Method: Circle System Utilized Preoxygenation: Pre-oxygenation with 100% oxygen Induction Type: IV induction Ventilation: Mask ventilation without difficulty and Oral airway inserted - appropriate to patient size Laryngoscope Size: 2 and Miller Grade View: Grade II Tube type: Oral Tube size: 7.5 mm Number of attempts: 1 Airway Equipment and Method: Oral airway and Stylet Placement Confirmation: ETT inserted through vocal cords under direct vision,  positive ETCO2 and breath sounds checked- equal and bilateral Secured at: 22 cm Tube secured with: Tape Dental Injury: Teeth and Oropharynx as per pre-operative assessment

## 2017-12-08 MED ORDER — HYDROCODONE-ACETAMINOPHEN 5-325 MG PO TABS: 1.0000 | ORAL_TABLET | ORAL | 0 refills | Status: DC | PRN

## 2017-12-08 NOTE — Progress Notes (Signed)
Patient alert and oriented, mae's well, voiding adequate amount of urine, swallowing without difficulty, no c/o pain at time of discharge. Patient discharged home with family. Script and discharged instructions given to patient. Patient and family stated understanding of instructions given. Patient has an appointment with Dr. Nudelman 

## 2017-12-08 NOTE — Discharge Summary (Signed)
Physician Discharge Summary  Patient ID: Joel HolsterMark S Bailey MRN: [id number] DOB/AGE: 1956-03-05 61 y.o.  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  Postoperatively he has done well.  He is up and ambulating actively.  He does have some residual right lumbar radicular symptoms including weakness, numbness, and discomfort.  Mild to moderate incisional discomfort.  His incision is healing nicely.  There is no swelling, erythema, or drainage.  He is voiding well.  He is up and ambulating in the halls actively.  We are discharging him to home with instructions regarding wound care and activities.  He is scheduled to follow-up with me in the office in about 3 weeks.  Discharge Exam: Blood pressure 96/70, pulse 67, temperature 97.8 F (36.6 C), temperature source Oral, resp. rate 16, weight 64.9 kg (143 lb), SpO2 94 %.  Disposition: 01-Home or Self Care  Discharge Instructions    Discharge wound care:   Complete by:  As directed    Leave the wound open to air. Shower daily with the wound uncovered. Water and soapy water should run over the incision area. Do not wash directly on the incision for 2 weeks. Remove the glue after 2 weeks.   Driving Restrictions   Complete by:  As directed    No driving for 2 weeks. May ride in the car locally now. May begin to drive locally in 2 weeks.   Other Restrictions   Complete by:  As  directed    Walk gradually increasing distances out in the fresh air at least twice a day. Walking additional 6 times inside the house, gradually increasing distances, daily. No bending, lifting, or twisting. Perform activities between shoulder and waist height (that is at counter height when standing or table height when sitting).     Allergies as of 12/08/2017   No Known Allergies     Medication List    TAKE these medications   cyclobenzaprine 10 MG tablet Commonly known as:  FLEXERIL Take 0.5-1 tablets (5-10 mg total) by mouth 3 (three) times daily as needed for muscle spasms.   HYDROcodone-acetaminophen 5-325 MG tablet Commonly known as:  NORCO/VICODIN Take 1-2 tablets by mouth every 4 (four) hours as needed for moderate pain. What changed:  Another medication with the same name was added. Make sure you understand how and when to take each.   HYDROcodone-acetaminophen 5-325 MG tablet Commonly known as:  NORCO/VICODIN Take 1-2 tablets by mouth every 4 (four) hours as needed (pain). What changed:  You were already taking a medication with the same name, and this prescription was added. Make sure you understand how and when to take each.            Discharge Care Instructions  (From admission, onward)        Start     Ordered   12/08/17 0000  Discharge wound care:    Comments:  Leave the wound open to air. Shower daily with the wound uncovered. Water and soapy water should run over the incision area. Do not wash  directly on the incision for 2 weeks. Remove the glue after 2 weeks.   12/08/17 [id number]       Signed: Hewitt ShortsNUDELMAN,ROBERT W 12/08/2017, 8:32 AM

## 2017-12-08 NOTE — Discharge Instructions (Signed)

## 2017-12-11 MED FILL — Sodium Chloride IV Soln 0.9%: INTRAVENOUS | Qty: 2000 | Status: AC

## 2017-12-11 MED FILL — Heparin Sodium (Porcine) Inj 1000 Unit/ML: INTRAMUSCULAR | Qty: 30 | Status: AC

## 2018-11-27 DIAGNOSIS — M21171 Varus deformity, not elsewhere classified, right ankle: Secondary | ICD-10-CM | POA: Insufficient documentation

## 2018-11-27 HISTORY — DX: Varus deformity, not elsewhere classified, right ankle: M21.171

## 2020-03-27 ENCOUNTER — Other Ambulatory Visit: Payer: Self-pay | Admitting: Specialist

## 2020-03-27 DIAGNOSIS — M5417 Radiculopathy, lumbosacral region: Secondary | ICD-10-CM

## 2020-04-21 ENCOUNTER — Ambulatory Visit
Admission: RE | Admit: 2020-04-21 | Discharge: 2020-04-21 | Disposition: A | Discharge status: Home / Self Care | Payer: BC Managed Care – PPO | Source: Ambulatory Visit | Attending: Specialist | Admitting: Specialist

## 2020-04-21 ENCOUNTER — Other Ambulatory Visit: Payer: Self-pay

## 2020-04-21 DIAGNOSIS — M5417 Radiculopathy, lumbosacral region: Secondary | ICD-10-CM

## 2021-02-14 IMAGING — MR MR LUMBAR SPINE W/O CM
4 of 5 series · 28 of 48 positions shown · non-contrast
Comparison: MRI of the lumbar spine July 19, 2017

CLINICAL DATA: Lumbosacral neuritis.

EXAM:
MRI LUMBAR SPINE WITHOUT CONTRAST
TECHNIQUE: Multiplanar, multisequence MR imaging of the lumbar spine was
performed. No intravenous contrast was administered.

[Series 3: T1 · sagittal · 4.0mm · 1.09mm/px · 7 of 17 slices shown (1 of 2)]
[im 1/17]
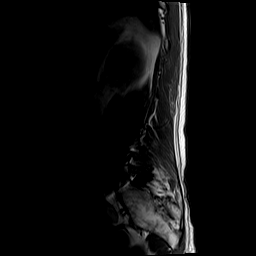
[im 3/17]
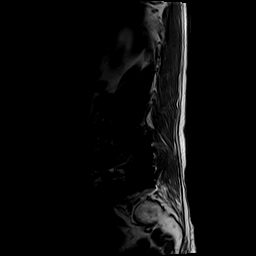
[im 6/17]
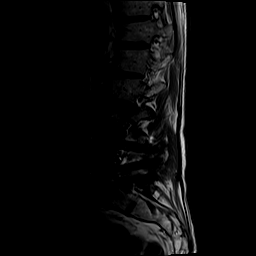
[im 9/17]
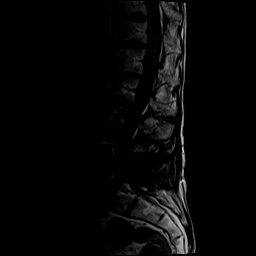
[im 11/17]
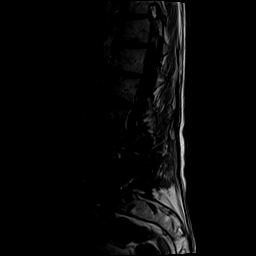
[im 14/17]
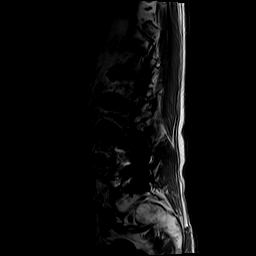
[im 17/17]
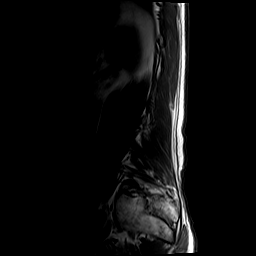

[Series 4: T2 · sagittal · 4.0mm · 1.09mm/px · 7 of 17 slices shown (1 of 2)]
[im 1/17]
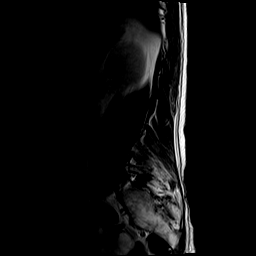
[im 3/17]
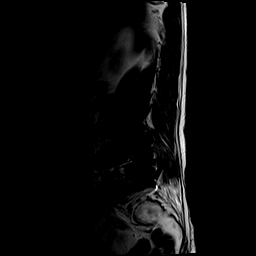
[im 6/17]
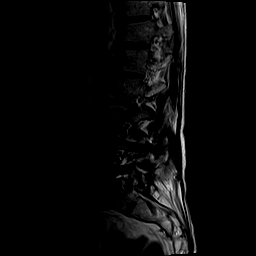
[im 9/17]
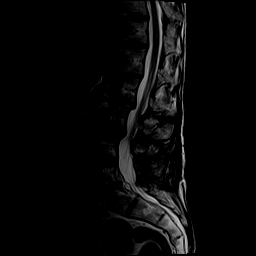
[im 11/17]
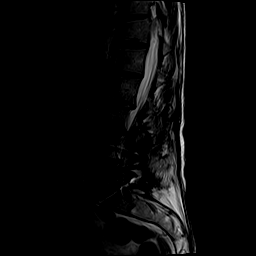
[im 14/17]
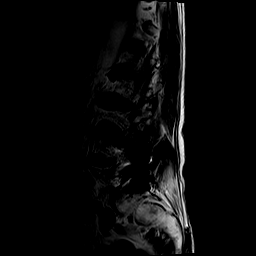
[im 17/17]
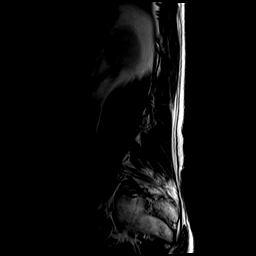

[Series 6: T2 · axial · 4.0mm · 0.39mm/px · z∈[-87,+107]mm · 8 of 38 slices shown (2 of 2)]
[im 1/38]
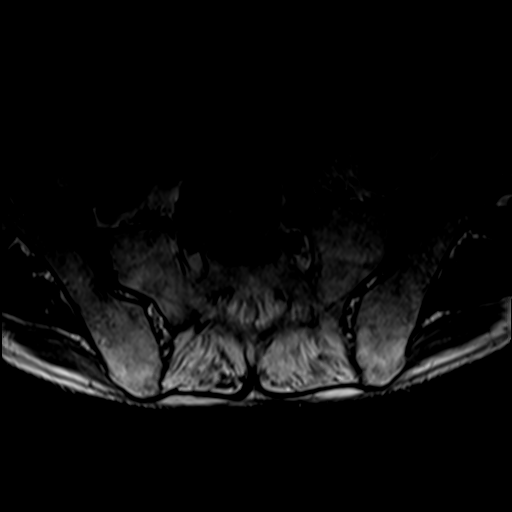
[im 6/38]
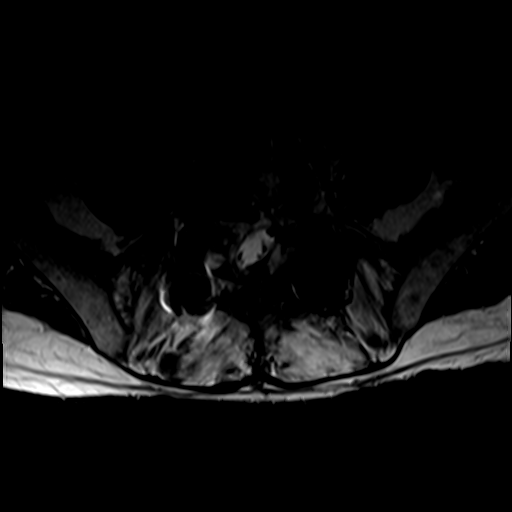
[im 12/38]
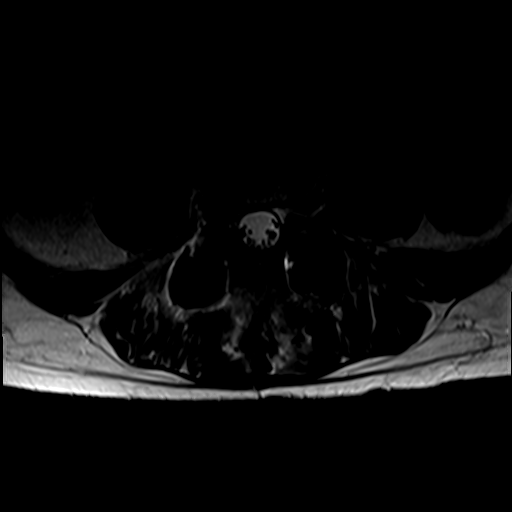
[im 18/38]
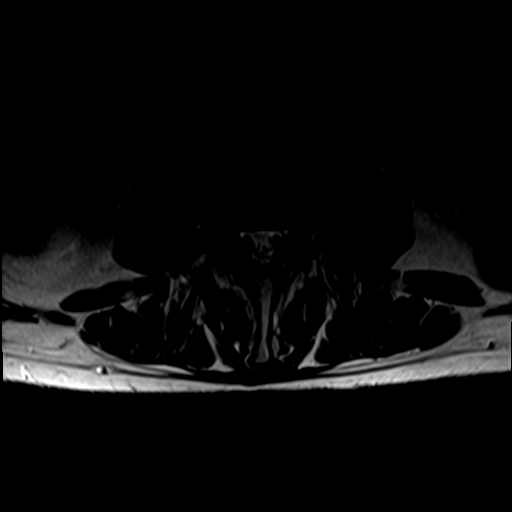
[im 20/38]
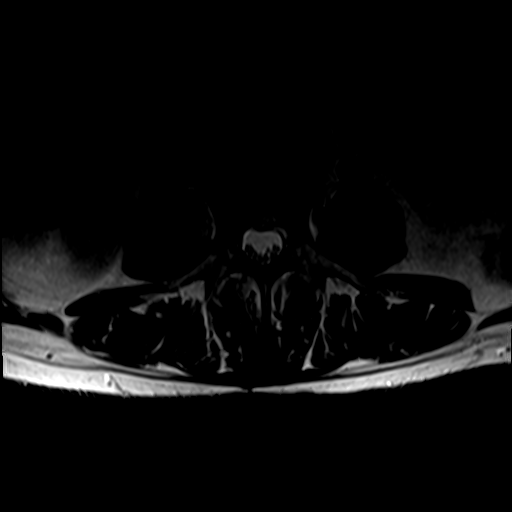
[im 26/38]
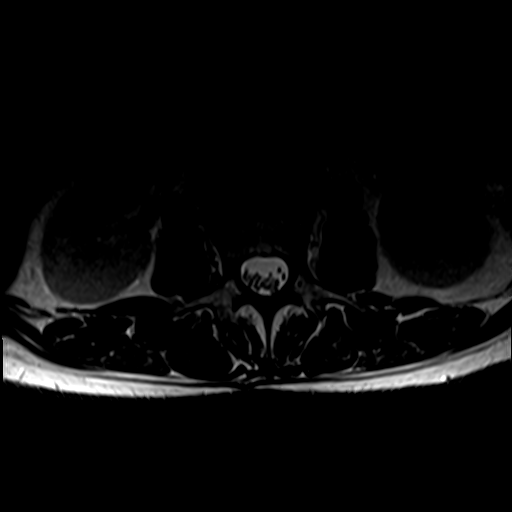
[im 32/38]
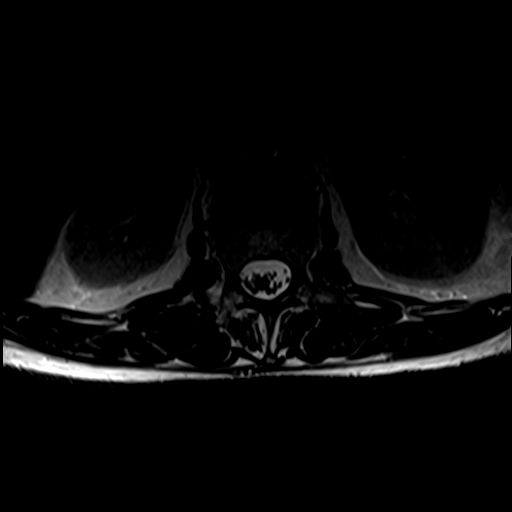
[im 38/38]
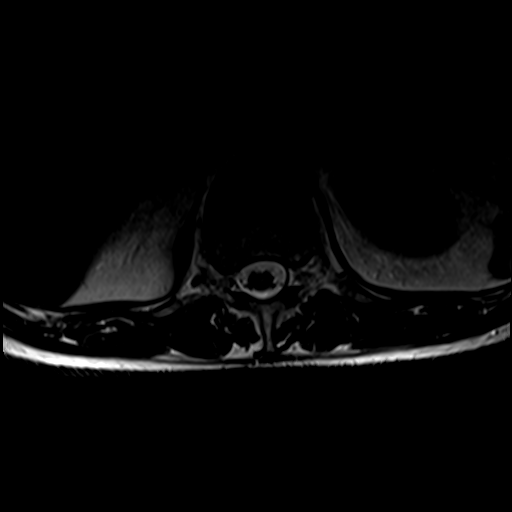

[Series 7: T1 · axial · 4.0mm · 0.39mm/px · z∈[-87,+78]mm · 6 of 38 slices shown (2 of 2)]
[im 1/38]
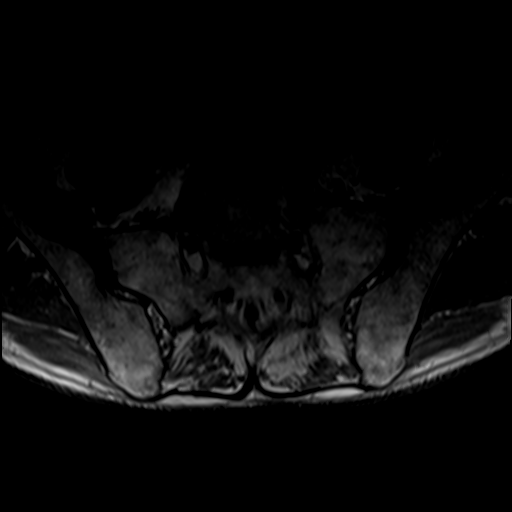
[im 6/38]
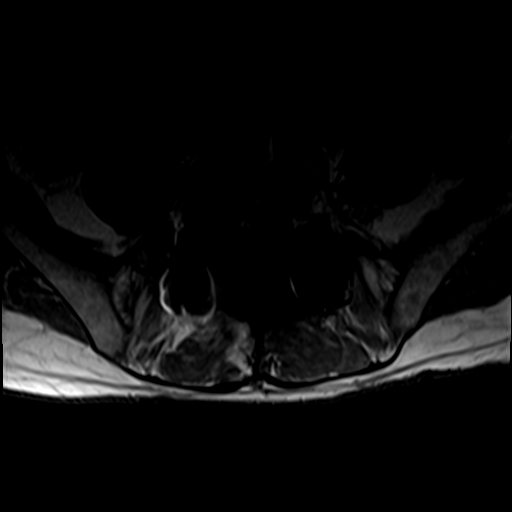
[im 12/38]
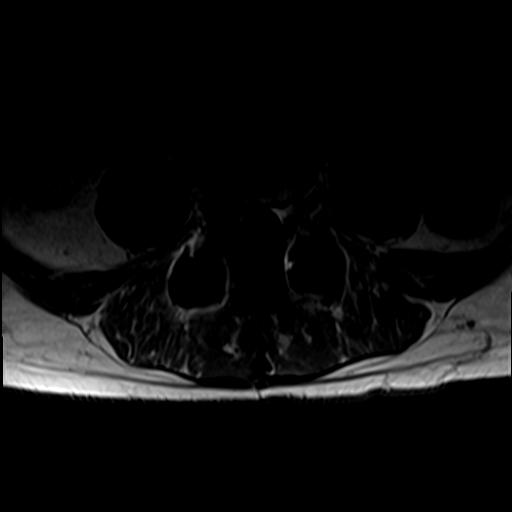
[im 18/38]
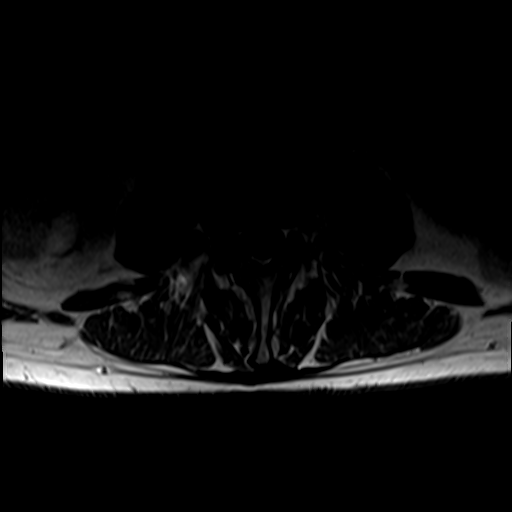
[im 20/38]
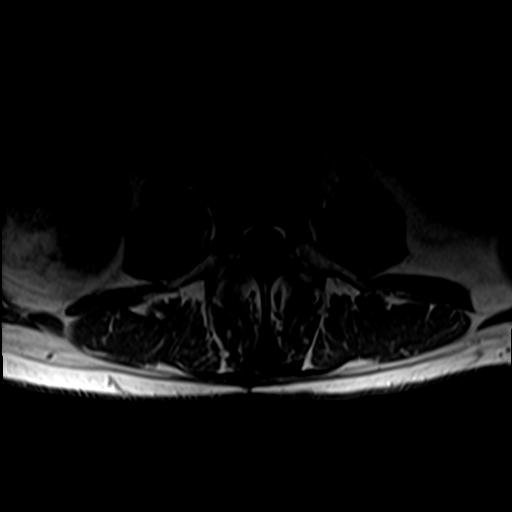
[im 32/38]
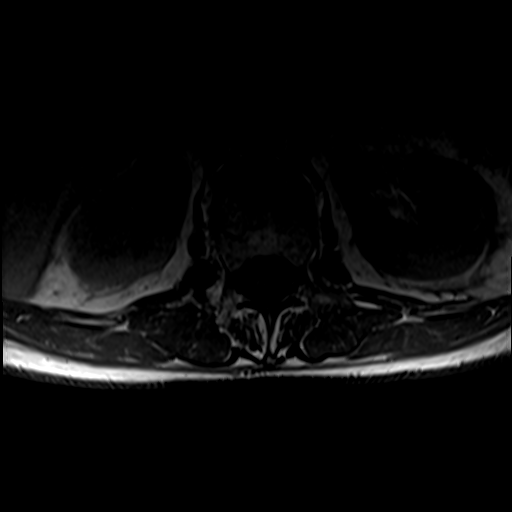

[28 of 48 positions shown; findings below may reference images not displayed]

FINDINGS: Segmentation:  Standard.

Alignment: Small retrolisthesis of L2 over L3 and L3 over L4, not
significantly changed from prior MRI.

Vertebrae: No fracture, evidence of discitis, or bone lesion.
Endplate degenerative changes throughout the lumbar spine with
associated loss of disc height from L1-2 through L4-5, mildly
progressed from prior MRI. There has been interval L4-5 and L5-S1
interbody fusion and laminectomy with posterior transpedicular
fixation from L4 through S1.

Conus medullaris and cauda equina: Conus extends to the L1-2 level.
Conus and cauda equina appear normal.

Paraspinal and other soft tissues: Negative.

Disc levels:

T12-L1: No spinal canal or neural foraminal stenosis.

L1-2: No spinal canal or neural foraminal stenosis.

L2-3: Disc bulge/disc uncovering and mild facet degenerative changes
resulting in narrowing of the subarticular zones, right greater than
left and mild bilateral neural foraminal narrowing.

L3-4: Disc bulge/disc uncovering, facet degenerative change
ligamentum flavum redundancy resulting in mild spinal canal stenosis
with narrowing of the subarticular zones and mild-to-moderate
bilateral neural foraminal narrowing.

L4-5: Postsurgical changes noted with T1 hypointense tissue
surrounding the thecal sac and the traversing bilateral L5 nerve
roots. No spinal canal or neural foraminal stenosis.

L5-S1: Postsurgical changes noted with T1 hypointense tissue
surrounding the thecal sac and the traversing bilateral S1 nerve
root with slightly posterior retraction of the thecal sac on the
right side which may be related to postsurgical fibrosis. Tiny
residual left central disc protrusion. No spinal canal or neural
foraminal stenosis.
IMPRESSION: 1. Postsurgical changes of L4-5 and L5-S1 interbody fusion,
laminectomy and posterior fixation as described above.
2. T1 hypointense tissue surrounding the thecal sac and the
corresponding traversing bilateral nerve roots at the L4-5 and L5-S1
levels with mild posterior retraction of the thecal sac on the right
at L5-S1 which may be related to postsurgical fibrosis.
3. Mild spinal canal stenosis and mild-to-moderate bilateral neural
foraminal narrowing at L3-L4.

## 2021-02-16 DIAGNOSIS — R29898 Other symptoms and signs involving the musculoskeletal system: Secondary | ICD-10-CM | POA: Insufficient documentation

## 2021-02-16 HISTORY — DX: Other symptoms and signs involving the musculoskeletal system: R29.898

## 2022-08-05 DIAGNOSIS — M5417 Radiculopathy, lumbosacral region: Secondary | ICD-10-CM | POA: Diagnosis not present

## 2022-08-05 DIAGNOSIS — R29898 Other symptoms and signs involving the musculoskeletal system: Secondary | ICD-10-CM | POA: Diagnosis not present

## 2022-08-05 DIAGNOSIS — Z981 Arthrodesis status: Secondary | ICD-10-CM | POA: Diagnosis not present

## 2022-08-05 DIAGNOSIS — M542 Cervicalgia: Secondary | ICD-10-CM | POA: Diagnosis not present

## 2022-08-05 DIAGNOSIS — M25852 Other specified joint disorders, left hip: Secondary | ICD-10-CM | POA: Diagnosis not present

## 2022-08-05 DIAGNOSIS — M25851 Other specified joint disorders, right hip: Secondary | ICD-10-CM | POA: Diagnosis not present

## 2022-09-01 ENCOUNTER — Encounter: Payer: Self-pay | Admitting: Orthopedic Surgery

## 2022-09-01 ENCOUNTER — Ambulatory Visit (INDEPENDENT_AMBULATORY_CARE_PROVIDER_SITE_OTHER): Payer: BC Managed Care – PPO

## 2022-09-01 ENCOUNTER — Ambulatory Visit: Payer: BC Managed Care – PPO | Admitting: Orthopedic Surgery

## 2022-09-01 DIAGNOSIS — M25571 Pain in right ankle and joints of right foot: Secondary | ICD-10-CM

## 2022-09-01 DIAGNOSIS — M67979 Unspecified disorder of synovium and tendon, unspecified ankle and foot: Secondary | ICD-10-CM | POA: Diagnosis not present

## 2022-09-01 DIAGNOSIS — M19071 Primary osteoarthritis, right ankle and foot: Secondary | ICD-10-CM

## 2022-09-01 NOTE — Progress Notes (Signed)
Office Visit Note   Patient: Joel Bailey           Date of Birth: [birth year]           MRN: [id number] Visit Date: 09/01/2022              Requested by: No referring provider defined for this encounter. PCP: Pcp, No  Chief Complaint  Patient presents with   Right Foot - Pain      HPI: Patient is a 66 year old gentleman who is seen for initial evaluation for right foot pain and deformity.  Patient is currently wearing a double upright brace with a T-strap patient states he is unable to perform activities daily living due to pain and weakness.  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  Patient has had progressive cavovarus deformity of the foot.  Patient states he has been to [facility] and other hospitals with indication that there was not surgical intervention available.  Assessment & Plan: Visit Diagnoses:  1. Pain in right ankle and joints of right foot   2. Arthritis of right subtalar joint   3. Arthralgia of right ankle   4. Disorder of peroneal tendon     Plan: Discussed with the patient with the progressive varus deformity of the tibial talar joint the varus deformity of the calcaneus and the absence of peroneal tendon function a tibial calcaneal fusion with correction of the alignment of the hindfoot would be a recommended option option.  Risks and benefits were discussed including infection neurovascular injury nonhealing the bone need for additional surgery.  Patient states she understands and wishes to proceed with surgery after 1 January.  He states he is heading on a vacation trip in November.  Follow-Up Instructions: Return if symptoms worsen or fail to improve.   Ortho Exam  Patient is alert, oriented, no adenopathy, well-dressed, normal affect, normal respiratory effort. Examination patient has a palpable dorsalis pedis pulse he has a fixed  varus deformity of the ankle and fixed varus deformity of the calcaneus.  There is flexible cavovarus deformity of the forefoot.  Patient has no peroneal tendon function with no active eversion.  He does have dorsiflexion with active function of the anterior tibial tendon and active inversion with good activation of the posterior tibial tendon.  Imaging: XR Ankle Complete Right  Result Date: 09/01/2022 2 view radiographs of the right call shows varus deformity of the tibial talar joint with a varus hindfoot.  XR Foot Complete Right  Result Date: 09/01/2022 2 view radiographs of the right foot shows varus alignment of the forefoot.  No images are attached to the encounter.  Labs: No results found for: "HGBA1C", "ESRSEDRATE", "CRP", "LABURIC", "REPTSTATUS", "GRAMSTAIN", "CULT", "LABORGA"   No results found for: "ALBUMIN", "PREALBUMIN", "CBC"  No results found for: "MG" No results found for: "VD25OH"  No results found for: "PREALBUMIN"    Latest Ref Rng & Units 12/01/2017    1:26 PM 08/02/2017    2:48 PM  CBC EXTENDED  WBC 4.0 - 10.5 K/uL 16.0  11.8   RBC 4.22 - 5.81 MIL/uL 4.51  3.99   Hemoglobin 13.0 - 17.0 g/dL 99.3  57.0   HCT 17.7 - 52.0 % 43.3  38.1   Platelets 150 - 400 K/uL 277  241      There is no height or weight on file to  calculate BMI.  Orders:  Orders Placed This Encounter  Procedures   XR Ankle Complete Right   XR Foot Complete Right   No orders of the defined types were placed in this encounter.    Procedures: No procedures performed  Clinical Data: No additional findings.  ROS:  All other systems negative, except as noted in the HPI. Review of Systems  Objective: Vital Signs: There were no vitals taken for this visit.  Specialty Comments:  No specialty comments available.  PMFS History: Patient Active Problem List   Diagnosis Date Noted   Lumbar stenosis with neurogenic claudication 12/07/2017   HNP (herniated nucleus pulposus) with  myelopathy, cervical 08/07/2017   Past Medical History:  Diagnosis Date   Arthritis     No family history on file.  Past Surgical History:  Procedure Laterality Date   ANTERIOR CERVICAL DECOMPRESSION/DISCECTOMY FUSION 4 LEVELS N/A 08/07/2017   Procedure: ANTERIOR CERVICAL DECOMPRESSION/DISCECTOMY FUSION CERVICAL 3- CERVICAL 4, CERVICAL 4 - CERVICAL 5, CERVICAL 5- CERVICAL 6, CERVICAL 6- CERVICAL 7;  Surgeon: Jovita Gamma, MD;  Location: [facility];  Service: Neurosurgery;  Laterality: N/A;   BACK SURGERY  2017   Social History   Occupational History   Not on file  Tobacco Use   Smoking status: Every Day    Packs/day: 0.50    Years: 37.00    Total pack years: 18.50    Types: Cigarettes   Smokeless tobacco: Never  Vaping Use   Vaping Use: Never used  Substance and Sexual Activity   Alcohol use: No   Drug use: No   Sexual activity: Not on file

## 2022-11-15 ENCOUNTER — Telehealth: Payer: Self-pay | Admitting: Orthopedic Surgery

## 2022-11-15 NOTE — Telephone Encounter (Signed)
LOV note states that pt wants to wait until after December 12 2022. Joel Bailey is working on getting urgent cases scheduled as they come each day. This is elective, so it will take longer to hear back about surgeries.

## 2022-11-15 NOTE — Telephone Encounter (Signed)
Patients is waiting to schedule his surgery and want to know when he can get in for surgery..has called several time and attempted to get an answer with no results. Please [phone], Alferd Apa.

## 2022-11-15 NOTE — Telephone Encounter (Signed)
Cheryl did Dr. Lajoyce Corners give you a surgery sheet for this pt?

## 2022-11-18 NOTE — Telephone Encounter (Signed)
I called Ms. Yow, per voicemail, and left a message for her to please return my call to discuss scheduling surgery.

## 2022-12-01 ENCOUNTER — Telehealth: Payer: Self-pay | Admitting: Orthopedic Surgery

## 2022-12-01 NOTE — Telephone Encounter (Signed)
Patient calling to get her husband Joel Bailey DOB [birth year] surgery scheduled call wife at [phone] [name] she said she has been calling in for 3 weeks and she has to get the surgery scheduled ASAP because she is supposed to be going out of town

## 2022-12-22 ENCOUNTER — Encounter (HOSPITAL_COMMUNITY): Payer: Self-pay | Admitting: Orthopedic Surgery

## 2022-12-22 ENCOUNTER — Other Ambulatory Visit: Payer: Self-pay

## 2022-12-22 NOTE — Progress Notes (Signed)
Joel Bailey denies chest pain or shortness of breath. Patient denies having any s/s of Covid in his household, also denies any known exposure to Covid.   Joel Bailey states he has a PCP in [place], he has not seen him in the past couple of years.  Joel Bailey could not pronounce the Dr.'s name.

## 2022-12-22 NOTE — Anesthesia Preprocedure Evaluation (Addendum)
Anesthesia Evaluation  Patient identified by MRN, date of birth, ID band Patient awake    Reviewed: Allergy & Precautions, NPO status , Patient's Chart, lab work & pertinent test results  Airway Mallampati: II  TM Distance: >3 FB Neck ROM: Limited    Dental  (+) Dental Advisory Given, Poor Dentition, Missing,    Pulmonary Current Smoker   breath sounds clear to auscultation       Cardiovascular negative cardio ROS  Rhythm:Regular Rate:Normal     Neuro/Psych Back pain     GI/Hepatic negative GI ROS, Neg liver ROS,,,  Endo/Other  negative endocrine ROS    Renal/GU negative Renal ROS     Musculoskeletal  (+) Arthritis ,    Abdominal   Peds  Hematology negative hematology ROS (+)   Anesthesia Other Findings   Reproductive/Obstetrics                             Anesthesia Physical Anesthesia Plan  ASA: 3  Anesthesia Plan: General   Post-op Pain Management: Regional block*, Tylenol PO (pre-op)*, Gabapentin PO (pre-op)* and Celebrex PO (pre-op)*   Induction: Intravenous  PONV Risk Score and Plan: 2 and Treatment may vary due to age or medical condition, Dexamethasone, Ondansetron and Midazolam  Airway Management Planned: LMA  Additional Equipment:   Intra-op Plan:   Post-operative Plan: Extubation in OR  Informed Consent: I have reviewed the patients History and Physical, chart, labs and discussed the procedure including the risks, benefits and alternatives for the proposed anesthesia with the patient or authorized representative who has indicated his/her understanding and acceptance.     Dental advisory given  Plan Discussed with: CRNA  Anesthesia Plan Comments:         Anesthesia Quick Evaluation

## 2022-12-23 ENCOUNTER — Encounter (HOSPITAL_COMMUNITY): Payer: Self-pay | Admitting: Orthopedic Surgery

## 2022-12-23 ENCOUNTER — Ambulatory Visit (HOSPITAL_COMMUNITY): Payer: BC Managed Care – PPO | Admitting: Anesthesiology

## 2022-12-23 ENCOUNTER — Other Ambulatory Visit: Payer: Self-pay

## 2022-12-23 ENCOUNTER — Encounter (HOSPITAL_COMMUNITY)
Admission: RE | Disposition: A | Discharge status: Home / Self Care | Payer: Self-pay | Source: Home / Self Care | Attending: Orthopedic Surgery

## 2022-12-23 ENCOUNTER — Ambulatory Visit (HOSPITAL_COMMUNITY)
Admission: RE | Admit: 2022-12-23 | Discharge: 2022-12-23 | Disposition: A | Discharge status: Home / Self Care | Payer: BC Managed Care – PPO | Attending: Orthopedic Surgery | Admitting: Orthopedic Surgery

## 2022-12-23 DIAGNOSIS — M25371 Other instability, right ankle: Secondary | ICD-10-CM

## 2022-12-23 DIAGNOSIS — G8918 Other acute postprocedural pain: Secondary | ICD-10-CM | POA: Diagnosis not present

## 2022-12-23 DIAGNOSIS — M19071 Primary osteoarthritis, right ankle and foot: Secondary | ICD-10-CM | POA: Diagnosis not present

## 2022-12-23 DIAGNOSIS — F17200 Nicotine dependence, unspecified, uncomplicated: Secondary | ICD-10-CM | POA: Diagnosis not present

## 2022-12-23 DIAGNOSIS — M216X1 Other acquired deformities of right foot: Secondary | ICD-10-CM

## 2022-12-23 HISTORY — DX: Other acquired deformities of right foot: M21.6X1

## 2022-12-23 HISTORY — DX: Other instability, right ankle: M25.371

## 2022-12-23 HISTORY — PX: FOOT ARTHRODESIS: SHX1655

## 2022-12-23 LAB — CBC
HCT: 42.9 % (ref 39.0–52.0)
Hemoglobin: 14.7 g/dL (ref 13.0–17.0)
MCH: 32.8 pg (ref 26.0–34.0)
MCHC: 34.3 g/dL (ref 30.0–36.0)
MCV: 95.8 fL (ref 80.0–100.0)
Platelets: 245 10*3/uL (ref 150–400)
RBC: 4.48 MIL/uL (ref 4.22–5.81)
RDW: 14.2 % (ref 11.5–15.5)
WBC: 13.7 10*3/uL — ABNORMAL HIGH (ref 4.0–10.5)
nRBC: 0 % (ref 0.0–0.2)

## 2022-12-23 LAB — SURGICAL PCR SCREEN
MRSA, PCR: NEGATIVE
Staphylococcus aureus: NEGATIVE

## 2022-12-23 SURGERY — FUSION, JOINT, FOOT
Anesthesia: General | Site: Foot | Laterality: Right

## 2022-12-23 MED ORDER — EPHEDRINE SULFATE-NACL 50-0.9 MG/10ML-% IV SOSY
PREFILLED_SYRINGE | INTRAVENOUS | Status: DC | PRN
  Administered 2022-12-23 (×3): 5 mg via INTRAVENOUS

## 2022-12-23 MED ORDER — EPHEDRINE 5 MG/ML INJ
INTRAVENOUS | Status: AC
  Filled 2022-12-23: qty 5

## 2022-12-23 MED ORDER — ROPIVACAINE HCL 5 MG/ML IJ SOLN
INTRAMUSCULAR | Status: DC | PRN
  Administered 2022-12-23: 15 mL via PERINEURAL
  Administered 2022-12-23: 25 mL via PERINEURAL

## 2022-12-23 MED ORDER — LIDOCAINE 2% (20 MG/ML) 5 ML SYRINGE
INTRAMUSCULAR | Status: DC | PRN
  Administered 2022-12-23: 40 mg via INTRAVENOUS

## 2022-12-23 MED ORDER — PHENYLEPHRINE HCL-NACL 20-0.9 MG/250ML-% IV SOLN
INTRAVENOUS | Status: DC | PRN
  Administered 2022-12-23: 20 ug/min via INTRAVENOUS

## 2022-12-23 MED ORDER — ONDANSETRON HCL 4 MG/2ML IJ SOLN
INTRAMUSCULAR | Status: AC
  Filled 2022-12-23: qty 2

## 2022-12-23 MED ORDER — LIDOCAINE 2% (20 MG/ML) 5 ML SYRINGE
INTRAMUSCULAR | Status: AC
  Filled 2022-12-23: qty 5

## 2022-12-23 MED ORDER — MEPERIDINE HCL 25 MG/ML IJ SOLN: 6.2500 mg | INTRAMUSCULAR | Status: DC | PRN

## 2022-12-23 MED ORDER — HYDROMORPHONE HCL 1 MG/ML IJ SOLN: 0.2500 mg | INTRAMUSCULAR | Status: DC | PRN

## 2022-12-23 MED ORDER — CEFAZOLIN SODIUM-DEXTROSE 2-4 GM/100ML-% IV SOLN
2.0000 g | INTRAVENOUS | Status: AC
  Administered 2022-12-23: 2 g via INTRAVENOUS
  Filled 2022-12-23: qty 100

## 2022-12-23 MED ORDER — CELECOXIB 200 MG PO CAPS
200.0000 mg | ORAL_CAPSULE | Freq: Once | ORAL | Status: AC
  Administered 2022-12-23: 200 mg via ORAL
  Filled 2022-12-23: qty 1

## 2022-12-23 MED ORDER — CLONIDINE HCL (ANALGESIA) 100 MCG/ML EP SOLN
EPIDURAL | Status: DC | PRN
  Administered 2022-12-23: 50 ug
  Administered 2022-12-23: 30 ug

## 2022-12-23 MED ORDER — 0.9 % SODIUM CHLORIDE (POUR BTL) OPTIME
TOPICAL | Status: DC | PRN
  Administered 2022-12-23: 1000 mL

## 2022-12-23 MED ORDER — ACETAMINOPHEN 500 MG PO TABS
1000.0000 mg | ORAL_TABLET | Freq: Once | ORAL | Status: AC
  Administered 2022-12-23: 1000 mg via ORAL
  Filled 2022-12-23: qty 2

## 2022-12-23 MED ORDER — PROPOFOL 10 MG/ML IV BOLUS
INTRAVENOUS | Status: AC
  Filled 2022-12-23: qty 20

## 2022-12-23 MED ORDER — FENTANYL CITRATE (PF) 100 MCG/2ML IJ SOLN
INTRAMUSCULAR | Status: AC
  Administered 2022-12-23: 50 ug via INTRAVENOUS
  Filled 2022-12-23: qty 2

## 2022-12-23 MED ORDER — DEXAMETHASONE SODIUM PHOSPHATE 10 MG/ML IJ SOLN
INTRAMUSCULAR | Status: DC | PRN
  Administered 2022-12-23: 5 mg via INTRAVENOUS

## 2022-12-23 MED ORDER — DEXAMETHASONE SODIUM PHOSPHATE 4 MG/ML IJ SOLN
INTRAMUSCULAR | Status: DC | PRN
  Administered 2022-12-23: 2 mg via PERINEURAL
  Administered 2022-12-23: 3 mg via PERINEURAL

## 2022-12-23 MED ORDER — ONDANSETRON HCL 4 MG/2ML IJ SOLN
INTRAMUSCULAR | Status: DC | PRN
  Administered 2022-12-23: 4 mg via INTRAVENOUS

## 2022-12-23 MED ORDER — ORAL CARE MOUTH RINSE: 15.0000 mL | Freq: Once | OROMUCOSAL | Status: AC

## 2022-12-23 MED ORDER — FENTANYL CITRATE (PF) 100 MCG/2ML IJ SOLN: 50.0000 ug | Freq: Once | INTRAMUSCULAR | Status: AC

## 2022-12-23 MED ORDER — PROPOFOL 10 MG/ML IV BOLUS
INTRAVENOUS | Status: DC | PRN
  Administered 2022-12-23: 120 mg via INTRAVENOUS

## 2022-12-23 MED ORDER — MIDAZOLAM HCL 2 MG/2ML IJ SOLN
INTRAMUSCULAR | Status: AC
  Administered 2022-12-23: 1 mg via INTRAVENOUS
  Filled 2022-12-23: qty 2

## 2022-12-23 MED ORDER — PROMETHAZINE HCL 25 MG/ML IJ SOLN: 6.2500 mg | INTRAMUSCULAR | Status: DC | PRN

## 2022-12-23 MED ORDER — DEXAMETHASONE SODIUM PHOSPHATE 10 MG/ML IJ SOLN
INTRAMUSCULAR | Status: AC
  Filled 2022-12-23: qty 1

## 2022-12-23 MED ORDER — CHLORHEXIDINE GLUCONATE 0.12 % MT SOLN
15.0000 mL | Freq: Once | OROMUCOSAL | Status: AC
  Administered 2022-12-23: 15 mL via OROMUCOSAL
  Filled 2022-12-23: qty 15

## 2022-12-23 MED ORDER — LACTATED RINGERS IV SOLN: INTRAVENOUS | Status: DC

## 2022-12-23 MED ORDER — OXYCODONE-ACETAMINOPHEN 5-325 MG PO TABS: 1.0000 | ORAL_TABLET | ORAL | 0 refills | Status: DC | PRN

## 2022-12-23 MED ORDER — MIDAZOLAM HCL 2 MG/2ML IJ SOLN: 1.0000 mg | Freq: Once | INTRAMUSCULAR | Status: AC

## 2022-12-23 SURGICAL SUPPLY — 59 items
BAG COUNTER SPONGE SURGICOUNT (BAG) ×2 IMPLANT
BAG SPNG CNTER NS LX DISP (BAG) ×1
BANDAGE ESMARK 6X9 LF (GAUZE/BANDAGES/DRESSINGS) IMPLANT
BIT DRILL SA 2.7 (DRILL) IMPLANT
BIT DRILL SA 3.5 (DRILL) IMPLANT
BLADE SAW SGTL HD 18.5X60.5X1. (BLADE) ×2 IMPLANT
BLADE SURG 10 STRL SS (BLADE) IMPLANT
BNDG CMPR 9X6 STRL LF SNTH (GAUZE/BANDAGES/DRESSINGS)
BNDG COHESIVE 4X5 TAN STRL (GAUZE/BANDAGES/DRESSINGS) ×2 IMPLANT
BNDG ESMARK 6X9 LF (GAUZE/BANDAGES/DRESSINGS)
BNDG GAUZE DERMACEA FLUFF 4 (GAUZE/BANDAGES/DRESSINGS) ×4 IMPLANT
BNDG GZE DERMACEA 4 6PLY (GAUZE/BANDAGES/DRESSINGS) ×2
COTTON STERILE ROLL (GAUZE/BANDAGES/DRESSINGS) ×2 IMPLANT
COVER MAYO STAND STRL (DRAPES) IMPLANT
COVER SURGICAL LIGHT HANDLE (MISCELLANEOUS) ×4 IMPLANT
DRAPE INCISE IOBAN 66X45 STRL (DRAPES) ×2 IMPLANT
DRAPE OEC MINIVIEW 54X84 (DRAPES) IMPLANT
DRAPE U-SHAPE 47X51 STRL (DRAPES) ×2 IMPLANT
DRILL SA 2.7 (DRILL) ×1
DRILL SA 3.5 (DRILL) ×1
DRSG ADAPTIC 3X8 NADH LF (GAUZE/BANDAGES/DRESSINGS) ×2 IMPLANT
DRSG DERMACEA 8X12 NADH (GAUZE/BANDAGES/DRESSINGS) IMPLANT
DURAPREP 26ML APPLICATOR (WOUND CARE) ×2 IMPLANT
ELECT REM PT RETURN 9FT ADLT (ELECTROSURGICAL) ×1
ELECTRODE REM PT RTRN 9FT ADLT (ELECTROSURGICAL) ×2 IMPLANT
GAUZE PAD ABD 7.5X8 STRL (GAUZE/BANDAGES/DRESSINGS) IMPLANT
GAUZE SPONGE 4X4 12PLY STRL (GAUZE/BANDAGES/DRESSINGS) ×2 IMPLANT
GLOVE BIOGEL PI IND STRL 9 (GLOVE) ×2 IMPLANT
GLOVE SURG ORTHO 9.0 STRL STRW (GLOVE) ×2 IMPLANT
GOWN STRL REUS W/ TWL XL LVL3 (GOWN DISPOSABLE) ×6 IMPLANT
GOWN STRL REUS W/TWL XL LVL3 (GOWN DISPOSABLE) ×3
K-WIRE STE SA 2 (WIRE) ×1
KIT BASIN OR (CUSTOM PROCEDURE TRAY) ×2 IMPLANT
KIT TURNOVER KIT B (KITS) ×2 IMPLANT
KWIRE STE SA 2 (WIRE) IMPLANT
MANIFOLD NEPTUNE II (INSTRUMENTS) ×2 IMPLANT
NS IRRIG 1000ML POUR BTL (IV SOLUTION) ×2 IMPLANT
PACK ORTHO EXTREMITY (CUSTOM PROCEDURE TRAY) ×2 IMPLANT
PAD ABD 7.5X8 STRL (GAUZE/BANDAGES/DRESSINGS) IMPLANT
PAD ARMBOARD 7.5X6 YLW CONV (MISCELLANEOUS) ×4 IMPLANT
PAD CAST 4YDX4 CTTN HI CHSV (CAST SUPPLIES) ×2 IMPLANT
PADDING CAST COTTON 4X4 STRL (CAST SUPPLIES) ×1
PLATE LAT STRT ANKLE SM RT (Plate) IMPLANT
PUTTY DBM STAGRAFT PLUS 2CC (Putty) IMPLANT
SCREW LOCK SA 3.5X28 (Screw) IMPLANT
SCREW LOCK SA 3.5X30 (Screw) IMPLANT
SCREW LOCK SA NS 3.5X26 (Screw) IMPLANT
SCREW LOCK SA NS 3.5X34 (Screw) IMPLANT
SCREW NLOCK SA 5X34 (Screw) IMPLANT
SCREW NLOCK SA NS 3.5X28 (Screw) IMPLANT
SPONGE T-LAP 18X18 ~~LOC~~+RFID (SPONGE) ×2 IMPLANT
SUCTION FRAZIER HANDLE 10FR (MISCELLANEOUS) ×1
SUCTION TUBE FRAZIER 10FR DISP (MISCELLANEOUS) ×2 IMPLANT
SUT ETHILON 2 0 PSLX (SUTURE) ×6 IMPLANT
TOWEL GREEN STERILE (TOWEL DISPOSABLE) ×2 IMPLANT
TOWEL GREEN STERILE FF (TOWEL DISPOSABLE) ×2 IMPLANT
TUBE CONNECTING 12X1/4 (SUCTIONS) ×2 IMPLANT
WATER STERILE IRR 1000ML POUR (IV SOLUTION) ×2 IMPLANT
WIRE OLIVE SA SHORT (WIRE) IMPLANT

## 2022-12-23 NOTE — Anesthesia Procedure Notes (Signed)
Anesthesia Regional Block: Adductor canal block   Pre-Anesthetic Checklist: , timeout performed,  Correct Patient, Correct Site, Correct Laterality,  Correct Procedure, Correct Position, site marked,  Risks and benefits discussed,  Surgical consent,  Pre-op evaluation,  At surgeon's request and post-op pain management  Laterality: Lower and Right  Prep: chloraprep       Needles:  Injection technique: Single-shot  Needle Type: Stimiplex     Needle Length: 9cm  Needle Gauge: 21     Additional Needles:   Procedures:,,,, ultrasound used (permanent image in chart),,    Narrative:  Start time: 12/23/2022 7:55 AM End time: 12/23/2022 8:09 AM Injection made incrementally with aspirations every 5 mL.  Performed by: Personally  Anesthesiologist: Nolon Nations, MD  Additional Notes: BP cuff, EKG monitors applied. Sedation begun. Artery and nerve location verified with ultrasound. Anesthetic injected incrementally (91ml), slowly, and after negative aspirations under direct u/s guidance. Good fascial/perineural spread. Tolerated well.

## 2022-12-23 NOTE — Anesthesia Procedure Notes (Signed)
Procedure Name: LMA Insertion Date/Time: 12/23/2022 9:23 AM  Performed by: Maude Leriche, CRNAPre-anesthesia Checklist: Patient identified, Emergency Drugs available, Suction available, Patient being monitored and Timeout performed Patient Re-evaluated:Patient Re-evaluated prior to induction Oxygen Delivery Method: Circle system utilized Preoxygenation: Pre-oxygenation with 100% oxygen Induction Type: IV induction LMA: LMA inserted LMA Size: 4.0 Number of attempts: 1 Placement Confirmation: CO2 detector and breath sounds checked- equal and bilateral Tube secured with: Tape Dental Injury: Teeth and Oropharynx as per pre-operative assessment

## 2022-12-23 NOTE — Progress Notes (Signed)
Nurse tech noticed slight bleeding on patient's dressing while helping him get dressed. Bleeding has stopped, Dr. Sharol Given aware, and patient is okay for discharge. CAM walker boot applied by ortho tech and patient was instructed that if he notices further bleeding at home, call Dr. Jess Barters office.  Francine Graven, RN

## 2022-12-23 NOTE — Op Note (Signed)
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  SURGEON:  [name], MD  PHYSICIAN ASSISTANT:None ANESTHESIA:   General  PREOPERATIVE INDICATIONS:  Joel Bailey is a  67 y.o. male with a diagnosis of Right Ankle and Subtalar Arthritis who failed conservative measures and elected for surgical management.    The risks benefits and alternatives were discussed with the patient preoperatively including but not limited to the risks of infection, bleeding, nerve injury, cardiopulmonary complications, the need for revision surgery, among others, and the patient was willing to proceed.  OPERATIVE IMPLANTS: Lateral fusion plate Biomet 2 cc of demineralized bone matrix.  @ENCIMAGES @  OPERATIVE FINDINGS: C-arm fluoroscopy verified reduction the hindfoot was brought out of varus and valgus with stable alignment  OPERATIVE PROCEDURE: Patient brought the operating room after undergoing a block.  After adequate levels anesthesia were obtained patient's right lower extremity was prepped using DuraPrep draped into a sterile field a timeout was called.  A lateral incision was made over the fibula carried down subperiosteally.  The distal fibula was resected in 1 block of tissue.  Retractors were placed anterior and posterior to the tibial talar joint and an oscillating saw was used to make cuts through the tibia and talus with the ankle at 90 degrees and in slight valgus.  The bone was removed there was good petechial bleeding on the tibia and talus.  The oscillating saw was then used to remove articular cartilage from the subtalar joint.  This was also debrided and brought the talar calcaneal joint out of varus and valgus.  The lateral  plate was applied secured distally with 1 compression screw and 2 locking screws the joint was reduced and a proximal sliding compression screw was placed.  Sterile fluoroscopy verified alignment.  The remaining screws were placed with locking screws into the talus and tibia.  See arthroscopy verified reduction the wound was irrigated with normal saline.  2 cc of demineralized bone matrix was placed on the tibial talar joint.  The incision was closed using 2-0 nylon a sterile dressing was applied.  Patient was taken the PACU in stable condition.   DISCHARGE PLANNING:  Antibiotic duration: Preoperative antibiotics  Weightbearing: Nonweightbearing  Pain medication: Prescription for Percocet  Dressing care/ Wound VAC: Dry dressing change in the office at follow-up  Ambulatory devices: Crutches kneeling scooter or walker  Discharge to: Home.  Follow-up: In the office 1 week post operative.

## 2022-12-23 NOTE — Anesthesia Postprocedure Evaluation (Signed)
Anesthesia Post Note  Patient: Joel Bailey  Procedure(s) Performed: RIGHT TIBIOCALCANEAL FUSION (Right: Foot)     Patient location during evaluation: PACU Anesthesia Type: General Level of consciousness: sedated and patient cooperative Pain management: pain level controlled Vital Signs Assessment: post-procedure vital signs reviewed and stable Respiratory status: spontaneous breathing Cardiovascular status: stable Anesthetic complications: no   No notable events documented.  Last Vitals:  Vitals:   12/23/22 1115 12/23/22 1120  BP:  (!) 162/71  Pulse: (!) 45 (!) 44  Resp:  14  Temp:  36.5 C  SpO2:  99%    Last Pain:  Vitals:   12/23/22 1120  PainSc: 0-No pain                 Nolon Nations

## 2022-12-23 NOTE — Interval H&P Note (Signed)
History and Physical Interval Note:  12/23/2022 9:12 AM  Joel Bailey  has presented today for surgery, with the diagnosis of Right Ankle and Subtalar Arthritis.  The various methods of treatment have been discussed with the patient and family. After consideration of risks, benefits and other options for treatment, the patient has consented to  Procedure(s): RIGHT TIBIOCALCANEAL FUSION (Right) as a surgical intervention.  The patient's history has been reviewed, patient examined, no change in status, stable for surgery.  I have reviewed the patient's chart and labs.  Questions were answered to the patient's satisfaction.     [name]

## 2022-12-23 NOTE — H&P (Signed)
Joel Bailey is an 67 y.o. male.   Chief Complaint: Pain and instability with ambulation right foot and ankle HPI: Patient is a 67 year old gentleman who is seen for initial evaluation for right foot pain and deformity. Patient is currently wearing a double upright brace with a T-strap patient states he is unable to perform activities daily living due to pain and weakness. 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 Patient has had progressive cavovarus deformity of the foot. Patient states he has been to [facility] and other hospitals with indication that there was not surgical intervention available.   Past Medical History:  Diagnosis Date   Arthritis     Past Surgical History:  Procedure Laterality Date   ANTERIOR CERVICAL DECOMPRESSION/DISCECTOMY FUSION 4 LEVELS N/A 08/07/2017   Procedure: ANTERIOR CERVICAL DECOMPRESSION/DISCECTOMY FUSION CERVICAL 3- CERVICAL 4, CERVICAL 4 - CERVICAL 5, CERVICAL 5- CERVICAL 6, CERVICAL 6- CERVICAL 7;  Surgeon: Jovita Gamma, MD;  Location: [facility];  Service: Neurosurgery;  Laterality: N/A;   BACK SURGERY  2017    History reviewed. No pertinent family history. Social History:  reports that he has been smoking cigarettes. He has a 52.00 pack-year smoking history. He has never used smokeless tobacco. He reports that he does not drink alcohol and does not use drugs.  Allergies: No Known Allergies  No medications prior to admission.    No results found for this or any previous visit (from the past 48 hour(s)). No results found.  Review of Systems  All other systems reviewed and are negative.   Height 5\' 10"  (1.778 m), weight 63.5 kg. Physical Exam  Patient is alert, oriented, no adenopathy, well-dressed, normal affect, normal respiratory effort. Examination patient has a palpable dorsalis pedis pulse he has a fixed varus deformity  of the ankle and fixed varus deformity of the calcaneus.  There is flexible cavovarus deformity of the forefoot.  Patient has no peroneal tendon function with no active eversion.  He does have dorsiflexion with active function of the anterior tibial tendon and active inversion with good activation of the posterior tibial tendon. Assessment/Plan 1. Pain in right ankle and joints of right foot   2. Arthritis of right subtalar joint   3. Arthralgia of right ankle   4. Disorder of peroneal tendon       Plan: Discussed with the patient with the progressive varus deformity of the tibial talar joint the varus deformity of the calcaneus and the absence of peroneal tendon function a tibial calcaneal fusion with correction of the alignment of the hindfoot would be a recommended option option.  Risks and benefits were discussed including infection neurovascular injury nonhealing the bone need for additional surgery.  Patient states he understands and wishes to proceed with surgery  [name], MD 12/23/2022, 6:45 AM

## 2022-12-23 NOTE — Transfer of Care (Signed)
Immediate Anesthesia Transfer of Care Note  Patient: Joel Bailey  Procedure(s) Performed: RIGHT TIBIOCALCANEAL FUSION (Right: Foot)  Patient Location: PACU  Anesthesia Type:General  Level of Consciousness: awake, alert , and oriented  Airway & Oxygen Therapy: Patient Spontanous Breathing and Patient connected to nasal cannula oxygen  Post-op Assessment: Report given to RN, Post -op Vital signs reviewed and stable, Patient moving all extremities X 4, and Patient able to stick tongue midline  Post vital signs: Reviewed  Last Vitals:  Vitals Value Taken Time  BP 149/91 12/23/22 1050  Temp 97.6   Pulse 42 12/23/22 1051  Resp 11 12/23/22 1051  SpO2 100 % 12/23/22 1051  Vitals shown include unvalidated device data.  Last Pain:  Vitals:   12/23/22 0820  PainSc: 0-No pain         Complications: No notable events documented.

## 2022-12-23 NOTE — Anesthesia Procedure Notes (Signed)
Anesthesia Regional Block: Popliteal block   Pre-Anesthetic Checklist: , timeout performed,  Correct Patient, Correct Site, Correct Laterality,  Correct Procedure, Correct Position, site marked,  Risks and benefits discussed,  Surgical consent,  Pre-op evaluation,  At surgeon's request and post-op pain management  Laterality: Lower and Right  Prep: chloraprep       Needles:  Injection technique: Single-shot  Needle Type: Stimiplex     Needle Length: 10cm  Needle Gauge: 21     Additional Needles:   Procedures:,,,, ultrasound used (permanent image in chart),,   Motor weakness within 5 minutes.  Narrative:  Start time: 12/23/2022 8:09 AM End time: 12/23/2022 8:16 AM Injection made incrementally with aspirations every 5 mL.  Performed by: Personally  Anesthesiologist: Nolon Nations, MD  Additional Notes: Nerve located and needle positioned with direct ultrasound guidance. Good perineural spread. Patient tolerated well.

## 2022-12-23 NOTE — Progress Notes (Signed)
Orthopedic Tech Progress Note Patient Details:  Joel Bailey 1956/08/08 [id number]  Ortho Devices Type of Ortho Device: CAM walker, Crutches Ortho Device/Splint Location: RLE Ortho Device/Splint Interventions: Ordered, Application, Adjustment   Post Interventions Patient Tolerated: Well Instructions Provided: Adjustment of device, Care of device, Poper ambulation with device  [name] 12/23/2022, 11:49 AM

## 2022-12-27 ENCOUNTER — Ambulatory Visit (INDEPENDENT_AMBULATORY_CARE_PROVIDER_SITE_OTHER): Payer: BC Managed Care – PPO | Admitting: Family

## 2022-12-27 DIAGNOSIS — M19071 Primary osteoarthritis, right ankle and foot: Secondary | ICD-10-CM

## 2022-12-27 NOTE — Progress Notes (Signed)
   Post-Op Visit Note   Patient: Joel Bailey           Date of Birth: 07-Jul-1956           MRN: [id number] Visit Date: 12/27/2022 PCP: Pcp, No  Chief Complaint: No chief complaint on file.   HPI:  HPI The patient is a 67 year old gentleman seen status post right tibial calcaneal fusion he has had significant postoperative pain felt that the dressing was quite tight. Ortho Exam On examination of the right foot and ankle he has significant edema there is no open area.  Incision well-approximated sutures there is scant bloody drainage no erythema or warmth Visit Diagnoses: No diagnosis found.  Plan: Begin daily Dial soap cleansing.  Use Ace bandage for compression elevate May continue to use ice nonweightbearing in the cam walker follow-up with radiographs possible suture removal Follow-Up Instructions: No follow-ups on file.   Imaging: No results found.  Orders:  No orders of the defined types were placed in this encounter.  No orders of the defined types were placed in this encounter.    PMFS History: Patient Active Problem List   Diagnosis Date Noted   Unstable ankle, right 12/23/2022   Cavovarus deformity of foot, acquired, right 12/23/2022   Lumbar stenosis with neurogenic claudication 12/07/2017   HNP (herniated nucleus pulposus) with myelopathy, cervical 08/07/2017   Past Medical History:  Diagnosis Date   Arthritis     No family history on file.  Past Surgical History:  Procedure Laterality Date   ANTERIOR CERVICAL DECOMPRESSION/DISCECTOMY FUSION 4 LEVELS N/A 08/07/2017   Procedure: ANTERIOR CERVICAL DECOMPRESSION/DISCECTOMY FUSION CERVICAL 3- CERVICAL 4, CERVICAL 4 - CERVICAL 5, CERVICAL 5- CERVICAL 6, CERVICAL 6- CERVICAL 7;  Surgeon: Jovita Gamma, MD;  Location: [facility];  Service: Neurosurgery;  Laterality: N/A;   BACK SURGERY  2017   Social History   Occupational History   Not on file  Tobacco Use   Smoking status: Every Day    Packs/day: 1.00     Years: 52.00    Total pack years: 52.00    Types: Cigarettes   Smokeless tobacco: Never  Vaping Use   Vaping Use: Never used  Substance and Sexual Activity   Alcohol use: No   Drug use: No   Sexual activity: Not on file

## 2022-12-28 ENCOUNTER — Encounter (HOSPITAL_COMMUNITY): Payer: Self-pay | Admitting: Orthopedic Surgery

## 2023-01-03 ENCOUNTER — Encounter: Payer: Self-pay | Admitting: Family

## 2023-01-03 ENCOUNTER — Ambulatory Visit (INDEPENDENT_AMBULATORY_CARE_PROVIDER_SITE_OTHER): Payer: BC Managed Care – PPO | Admitting: Family

## 2023-01-03 ENCOUNTER — Ambulatory Visit (INDEPENDENT_AMBULATORY_CARE_PROVIDER_SITE_OTHER): Payer: BC Managed Care – PPO

## 2023-01-03 ENCOUNTER — Telehealth: Payer: Self-pay

## 2023-01-03 DIAGNOSIS — M25571 Pain in right ankle and joints of right foot: Secondary | ICD-10-CM | POA: Diagnosis not present

## 2023-01-03 MED ORDER — OXYCODONE HCL 5 MG PO CAPS: 5.0000 mg | ORAL_CAPSULE | ORAL | 0 refills | Status: DC | PRN

## 2023-01-03 NOTE — Progress Notes (Signed)
   Post-Op Visit Note   Patient: Joel Bailey           Date of Birth: 1956-12-01           MRN: [id number] Visit Date: 01/03/2023 PCP: Pcp, No  Chief Complaint:  Chief Complaint  Patient presents with   Right Ankle - Routine Post Op    12/23/22 right tib/cal fusion    HPI:  HPI Patient is a 67 year old gentleman seen status post right tibiocalcaneal fusion January 12 he is continue nonweightbearing in a cam walker has a kneeling scooter Has been having issues with pain control waking him up at night as well. Ortho Exam On examination of the right ankle the lateral incision is well-approximated sutures he has 2 areas with scant bloody drainage there is no impending dehiscence erythema no sign of cellulitis  Visit Diagnoses:  1. Pain in right ankle and joints of right foot     Plan: Continue with daily Dial soap cleansing dry dressings Ace wrap for compression and elevation for swelling have refilled his oxycodone  Follow-Up Instructions: No follow-ups on file.   Imaging: No results found.  Orders:  Orders Placed This Encounter  Procedures   XR Ankle Complete Right   No orders of the defined types were placed in this encounter.    PMFS History: Patient Active Problem List   Diagnosis Date Noted   Unstable ankle, right 12/23/2022   Cavovarus deformity of foot, acquired, right 12/23/2022   Lumbar stenosis with neurogenic claudication 12/07/2017   HNP (herniated nucleus pulposus) with myelopathy, cervical 08/07/2017   Past Medical History:  Diagnosis Date   Arthritis     No family history on file.  Past Surgical History:  Procedure Laterality Date   ANTERIOR CERVICAL DECOMPRESSION/DISCECTOMY FUSION 4 LEVELS N/A 08/07/2017   Procedure: ANTERIOR CERVICAL DECOMPRESSION/DISCECTOMY FUSION CERVICAL 3- CERVICAL 4, CERVICAL 4 - CERVICAL 5, CERVICAL 5- CERVICAL 6, CERVICAL 6- CERVICAL 7;  Surgeon: Jovita Gamma, MD;  Location: [facility];  Service: Neurosurgery;  Laterality:  N/A;   BACK SURGERY  2017   FOOT ARTHRODESIS Right 12/23/2022   Procedure: RIGHT TIBIOCALCANEAL FUSION;  Surgeon: [name], MD;  Location: [facility];  Service: Orthopedics;  Laterality: Right;   Social History   Occupational History   Not on file  Tobacco Use   Smoking status: Every Day    Packs/day: 1.00    Years: 52.00    Total pack years: 52.00    Types: Cigarettes   Smokeless tobacco: Never  Vaping Use   Vaping Use: Never used  Substance and Sexual Activity   Alcohol use: No   Drug use: No   Sexual activity: Not on file

## 2023-01-03 NOTE — Telephone Encounter (Signed)
Checked cover my meds and medication has been approved.

## 2023-01-03 NOTE — Telephone Encounter (Signed)
Prior auth sent to [facility] for Oxycodone rx. Pt is s/p a right tib-cal fusion and will hold this message pending insurance response.

## 2023-01-16 ENCOUNTER — Encounter: Payer: Self-pay | Admitting: Orthopedic Surgery

## 2023-01-16 ENCOUNTER — Ambulatory Visit (INDEPENDENT_AMBULATORY_CARE_PROVIDER_SITE_OTHER): Payer: BC Managed Care – PPO | Admitting: Orthopedic Surgery

## 2023-01-16 DIAGNOSIS — M25571 Pain in right ankle and joints of right foot: Secondary | ICD-10-CM

## 2023-01-16 NOTE — Progress Notes (Signed)
Office Visit Note   Patient: Joel Bailey           Date of Birth: Jan 27, 1956           MRN: [id number] Visit Date: 01/16/2023              Requested by: No referring provider defined for this encounter. PCP: Pcp, No  Chief Complaint  Patient presents with   Right Ankle - Routine Post Op    12/23/22 right tib/cal fusion      HPI: Patient is a 67 year old gentleman who is 3 weeks status post talonavicular and subtalar fusion with a lateral plate.  Patient is currently nonweightbearing with a cam boot and kneeling scooter.  Assessment & Plan: Visit Diagnoses:  1. Pain in right ankle and joints of right foot     Plan: Sutures are harvested recommended elevation and range of motion of the toes to decrease swelling.  Anticipate full weightbearing in the fracture boot at follow-up.  Three-view radiographs of the right ankle at follow-up.  Follow-Up Instructions: Return in about 3 weeks (around 02/06/2023).   Ortho Exam  Patient is alert, oriented, no adenopathy, well-dressed, normal affect, normal respiratory effort. Examination the incision is well-healed patient has good pulses he does have swelling in the foot with pitting edema there is no cellulitis.  Imaging: No results found. No images are attached to the encounter.  Labs: No results found for: "HGBA1C", "ESRSEDRATE", "CRP", "LABURIC", "REPTSTATUS", "GRAMSTAIN", "CULT", "LABORGA"   No results found for: "ALBUMIN", "PREALBUMIN", "CBC"  No results found for: "MG" No results found for: "VD25OH"  No results found for: "PREALBUMIN"    Latest Ref Rng & Units 12/23/2022    7:25 AM 12/01/2017    1:26 PM 08/02/2017    2:48 PM  CBC EXTENDED  WBC 4.0 - 10.5 K/uL 13.7  16.0  11.8   RBC 4.22 - 5.81 MIL/uL 4.48  4.51  3.99   Hemoglobin 13.0 - 17.0 g/dL 14.7  14.7  13.3   HCT 39.0 - 52.0 % 42.9  43.3  38.1   Platelets 150 - 400 K/uL 245  277  241      There is no height or weight on file to calculate BMI.  Orders:   No orders of the defined types were placed in this encounter.  No orders of the defined types were placed in this encounter.    Procedures: No procedures performed  Clinical Data: No additional findings.  ROS:  All other systems negative, except as noted in the HPI. Review of Systems  Objective: Vital Signs: There were no vitals taken for this visit.  Specialty Comments:  No specialty comments available.  PMFS History: Patient Active Problem List   Diagnosis Date Noted   Unstable ankle, right 12/23/2022   Cavovarus deformity of foot, acquired, right 12/23/2022   Lumbar stenosis with neurogenic claudication 12/07/2017   HNP (herniated nucleus pulposus) with myelopathy, cervical 08/07/2017   Past Medical History:  Diagnosis Date   Arthritis     History reviewed. No pertinent family history.  Past Surgical History:  Procedure Laterality Date   ANTERIOR CERVICAL DECOMPRESSION/DISCECTOMY FUSION 4 LEVELS N/A 08/07/2017   Procedure: ANTERIOR CERVICAL DECOMPRESSION/DISCECTOMY FUSION CERVICAL 3- CERVICAL 4, CERVICAL 4 - CERVICAL 5, CERVICAL 5- CERVICAL 6, CERVICAL 6- CERVICAL 7;  Surgeon: Jovita Gamma, MD;  Location: [facility];  Service: Neurosurgery;  Laterality: N/A;   BACK SURGERY  2017   FOOT ARTHRODESIS Right 12/23/2022   Procedure: RIGHT  TIBIOCALCANEAL FUSION;  Surgeon: [name], MD;  Location: [facility];  Service: Orthopedics;  Laterality: Right;   Social History   Occupational History   Not on file  Tobacco Use   Smoking status: Every Day    Packs/day: 1.00    Years: 52.00    Total pack years: 52.00    Types: Cigarettes   Smokeless tobacco: Never  Vaping Use   Vaping Use: Never used  Substance and Sexual Activity   Alcohol use: No   Drug use: No   Sexual activity: Not on file

## 2023-02-06 ENCOUNTER — Encounter: Payer: Self-pay | Admitting: Orthopedic Surgery

## 2023-02-06 ENCOUNTER — Encounter: Payer: BC Managed Care – PPO | Admitting: Orthopedic Surgery

## 2023-02-06 ENCOUNTER — Ambulatory Visit (INDEPENDENT_AMBULATORY_CARE_PROVIDER_SITE_OTHER): Payer: BC Managed Care – PPO

## 2023-02-06 ENCOUNTER — Ambulatory Visit (INDEPENDENT_AMBULATORY_CARE_PROVIDER_SITE_OTHER): Payer: BC Managed Care – PPO | Admitting: Orthopedic Surgery

## 2023-02-06 DIAGNOSIS — M25571 Pain in right ankle and joints of right foot: Secondary | ICD-10-CM

## 2023-02-06 DIAGNOSIS — M19071 Primary osteoarthritis, right ankle and foot: Secondary | ICD-10-CM

## 2023-02-06 NOTE — Progress Notes (Signed)
Office Visit Note   Patient: Joel Bailey           Date of Birth: 05-Jul-1956           MRN: [id number] Visit Date: 02/06/2023              Requested by: No referring provider defined for this encounter. PCP: Pcp, No  Chief Complaint  Patient presents with   Right Ankle - Routine Post Op    12/23/2022 tib-cal fusion       HPI: Patient is a 67 year old gentleman who is 6 weeks status post right tibial calcaneal fusion he is currently nonweightbearing with a kneeling scooter in a fracture boot.  Assessment & Plan: Visit Diagnoses:  1. Pain in right ankle and joints of right foot   2. Arthralgia of right ankle   3. Arthritis of right subtalar joint     Plan: Continue with elevation continue with a kneeling scooter.  Follow-up in 3 weeks with repeat three-view radiographs of the right ankle at which time anticipate we can begin weightbearing.  Follow-Up Instructions: Return in about 3 weeks (around 02/27/2023).   Ortho Exam  Patient is alert, oriented, no adenopathy, well-dressed, normal affect, normal respiratory effort. Examination the ankle is at 90 degrees there is some mild swelling the superficial blistering has resolved.  Imaging: XR Ankle Complete Right  Result Date: 02/06/2023 Three-view radiographs of the right ankle shows stable tibial calcaneal fusion no hardware loosening.  No images are attached to the encounter.  Labs: No results found for: "HGBA1C", "ESRSEDRATE", "CRP", "LABURIC", "REPTSTATUS", "GRAMSTAIN", "CULT", "LABORGA"   No results found for: "ALBUMIN", "PREALBUMIN", "CBC"  No results found for: "MG" No results found for: "VD25OH"  No results found for: "PREALBUMIN"    Latest Ref Rng & Units 12/23/2022    7:25 AM 12/01/2017    1:26 PM 08/02/2017    2:48 PM  CBC EXTENDED  WBC 4.0 - 10.5 K/uL 13.7  16.0  11.8   RBC 4.22 - 5.81 MIL/uL 4.48  4.51  3.99   Hemoglobin 13.0 - 17.0 g/dL 14.7  14.7  13.3   HCT 39.0 - 52.0 % 42.9  43.3  38.1    Platelets 150 - 400 K/uL 245  277  241      There is no height or weight on file to calculate BMI.  Orders:  Orders Placed This Encounter  Procedures   XR Ankle Complete Right   No orders of the defined types were placed in this encounter.    Procedures: No procedures performed  Clinical Data: No additional findings.  ROS:  All other systems negative, except as noted in the HPI. Review of Systems  Objective: Vital Signs: There were no vitals taken for this visit.  Specialty Comments:  No specialty comments available.  PMFS History: Patient Active Problem List   Diagnosis Date Noted   Unstable ankle, right 12/23/2022   Cavovarus deformity of foot, acquired, right 12/23/2022   Lumbar stenosis with neurogenic claudication 12/07/2017   HNP (herniated nucleus pulposus) with myelopathy, cervical 08/07/2017   Past Medical History:  Diagnosis Date   Arthritis     History reviewed. No pertinent family history.  Past Surgical History:  Procedure Laterality Date   ANTERIOR CERVICAL DECOMPRESSION/DISCECTOMY FUSION 4 LEVELS N/A 08/07/2017   Procedure: ANTERIOR CERVICAL DECOMPRESSION/DISCECTOMY FUSION CERVICAL 3- CERVICAL 4, CERVICAL 4 - CERVICAL 5, CERVICAL 5- CERVICAL 6, CERVICAL 6- CERVICAL 7;  Surgeon: Jovita Gamma, MD;  Location: [facility];  Service: Neurosurgery;  Laterality: N/A;   BACK SURGERY  2017   FOOT ARTHRODESIS Right 12/23/2022   Procedure: RIGHT TIBIOCALCANEAL FUSION;  Surgeon: [name], MD;  Location: [facility];  Service: Orthopedics;  Laterality: Right;   Social History   Occupational History   Not on file  Tobacco Use   Smoking status: Every Day    Packs/day: 1.00    Years: 52.00    Total pack years: 52.00    Types: Cigarettes   Smokeless tobacco: Never  Vaping Use   Vaping Use: Never used  Substance and Sexual Activity   Alcohol use: No   Drug use: No   Sexual activity: Not on file

## 2023-02-08 ENCOUNTER — Telehealth: Payer: Self-pay | Admitting: Orthopedic Surgery

## 2023-02-08 NOTE — Telephone Encounter (Signed)
[employer] forms received. To [employer].

## 2023-02-28 ENCOUNTER — Ambulatory Visit (INDEPENDENT_AMBULATORY_CARE_PROVIDER_SITE_OTHER): Payer: BC Managed Care – PPO | Admitting: Orthopedic Surgery

## 2023-02-28 ENCOUNTER — Other Ambulatory Visit (INDEPENDENT_AMBULATORY_CARE_PROVIDER_SITE_OTHER): Payer: BC Managed Care – PPO

## 2023-02-28 DIAGNOSIS — M25571 Pain in right ankle and joints of right foot: Secondary | ICD-10-CM | POA: Diagnosis not present

## 2023-03-05 ENCOUNTER — Encounter: Payer: Self-pay | Admitting: Orthopedic Surgery

## 2023-03-05 NOTE — Progress Notes (Signed)
Office Visit Note   Patient: Joel Bailey           Date of Birth: [birth year]           MRN: [id number] Visit Date: 02/28/2023              Requested by: No referring provider defined for this encounter. PCP: Pcp, No  Chief Complaint  Patient presents with   Right Ankle - Routine Post Op    12/23/2022 tib-cal fusion      HPI: Patient is a 67 year old gentleman who is 2 months status post right tibial calcaneal fusion.  He is currently using a kneeling scooter and elevation.  Assessment & Plan: Visit Diagnoses:  1. Pain in right ankle and joints of right foot     Plan: Patient has increased swelling.  He was placed in a medium compression sock he will need a large.  Recommend continue compression nonweightbearing.  Repeat three-view radiographs of the right ankle at follow-up.  Note provided to continue out of work for 4 weeks.  Follow-Up Instructions: Return in about 4 weeks (around 03/28/2023).   Ortho Exam  Patient is alert, oriented, no adenopathy, well-dressed, normal affect, normal respiratory effort. Examination there is increased swelling around the foot and ankle.  There is no redness no cellulitis the incision is well-healed.  Imaging: No results found. No images are attached to the encounter.  Labs: No results found for: "HGBA1C", "ESRSEDRATE", "CRP", "LABURIC", "REPTSTATUS", "GRAMSTAIN", "CULT", "LABORGA"   No results found for: "ALBUMIN", "PREALBUMIN", "CBC"  No results found for: "MG" No results found for: "VD25OH"  No results found for: "PREALBUMIN"    Latest Ref Rng & Units 12/23/2022    7:25 AM 12/01/2017    1:26 PM 08/02/2017    2:48 PM  CBC EXTENDED  WBC 4.0 - 10.5 K/uL 13.7  16.0  11.8   RBC 4.22 - 5.81 MIL/uL 4.48  4.51  3.99   Hemoglobin 13.0 - 17.0 g/dL 14.7  14.7  13.3   HCT 39.0 - 52.0 % 42.9  43.3  38.1   Platelets 150 - 400 K/uL 245  277  241      There is no height or weight on file to calculate BMI.  Orders:  Orders  Placed This Encounter  Procedures   XR Ankle Complete Right   No orders of the defined types were placed in this encounter.    Procedures: No procedures performed  Clinical Data: No additional findings.  ROS:  All other systems negative, except as noted in the HPI. Review of Systems  Objective: Vital Signs: There were no vitals taken for this visit.  Specialty Comments:  No specialty comments available.  PMFS History: Patient Active Problem List   Diagnosis Date Noted   Unstable ankle, right 12/23/2022   Cavovarus deformity of foot, acquired, right 12/23/2022   Lumbar stenosis with neurogenic claudication 12/07/2017   HNP (herniated nucleus pulposus) with myelopathy, cervical 08/07/2017   Past Medical History:  Diagnosis Date   Arthritis     No family history on file.  Past Surgical History:  Procedure Laterality Date   ANTERIOR CERVICAL DECOMPRESSION/DISCECTOMY FUSION 4 LEVELS N/A 08/07/2017   Procedure: ANTERIOR CERVICAL DECOMPRESSION/DISCECTOMY FUSION CERVICAL 3- CERVICAL 4, CERVICAL 4 - CERVICAL 5, CERVICAL 5- CERVICAL 6, CERVICAL 6- CERVICAL 7;  Surgeon: Jovita Gamma, MD;  Location: [facility];  Service: Neurosurgery;  Laterality: N/A;   BACK SURGERY  2017   FOOT ARTHRODESIS Right 12/23/2022  Procedure: RIGHT TIBIOCALCANEAL FUSION;  Surgeon: [name], MD;  Location: [place] [facility];  Service: Orthopedics;  Laterality: Right;   Social History   Occupational History   Not on file  Tobacco Use   Smoking status: Every Day    Packs/day: 1.00    Years: 52.00    Additional pack years: 0.00    Total pack years: 52.00    Types: Cigarettes   Smokeless tobacco: Never  Vaping Use   Vaping Use: Never used  Substance and Sexual Activity   Alcohol use: No   Drug use: No   Sexual activity: Not on file

## 2023-03-28 ENCOUNTER — Ambulatory Visit (INDEPENDENT_AMBULATORY_CARE_PROVIDER_SITE_OTHER): Payer: BC Managed Care – PPO | Admitting: Orthopedic Surgery

## 2023-03-28 ENCOUNTER — Other Ambulatory Visit (INDEPENDENT_AMBULATORY_CARE_PROVIDER_SITE_OTHER): Payer: BC Managed Care – PPO

## 2023-03-28 ENCOUNTER — Telehealth: Payer: Self-pay

## 2023-03-28 ENCOUNTER — Telehealth: Payer: Self-pay | Admitting: Orthopedic Surgery

## 2023-03-28 DIAGNOSIS — Z981 Arthrodesis status: Secondary | ICD-10-CM | POA: Diagnosis not present

## 2023-03-28 DIAGNOSIS — M19071 Primary osteoarthritis, right ankle and foot: Secondary | ICD-10-CM

## 2023-03-28 DIAGNOSIS — M25571 Pain in right ankle and joints of right foot: Secondary | ICD-10-CM

## 2023-03-28 NOTE — Telephone Encounter (Signed)
Relayed information to [employer]. She will revise existing forms to reflect RTW date. (no charge to patient). Thanks!

## 2023-03-28 NOTE — Telephone Encounter (Signed)
[employer] forms received. To [employer]. 

## 2023-03-28 NOTE — Telephone Encounter (Signed)
The pt has paid [employer] on three separate occasions to change his return to work paperwork date ( date only) s/p tib-cal fusion 12/23/2022. The pt was seen in the office today and Dr. Lajoyce Corners said that the pt can return to work on 07/24/2023 and would like the date updated in the existing paperwork but does not want the pt to be charged. Can you relay this information please?

## 2023-04-02 ENCOUNTER — Encounter: Payer: Self-pay | Admitting: Orthopedic Surgery

## 2023-04-02 NOTE — Progress Notes (Signed)
Office Visit Note   Patient: Joel Bailey           Date of Birth: [birth year]           MRN: [id number] Visit Date: 03/28/2023              Requested by: No referring provider defined for this encounter. PCP: Pcp, No  Chief Complaint  Patient presents with   Right Ankle - Routine Post Op    12/23/2022 tib-cal fusion      HPI: Patient is a 67 year old gentleman who is 3 months status post right tibial calcaneal fusion.  Patient has been in a compression sock nonweightbearing.  Assessment & Plan: Visit Diagnoses:  1. S/P ankle fusion   2. Arthralgia of right ankle   3. Arthritis of right subtalar joint   4. Pain in right ankle and joints of right foot     Plan: With the delayed union at the fusion site.  Will need to extend patient's out of work disability.  Current return to work date August 12.  Prescription for physical therapy upstairs.    Three-view radiographs of the right ankle at follow-up.  Follow-Up Instructions: Return in about 4 weeks (around 04/25/2023).   Ortho Exam  Patient is alert, oriented, no adenopathy, well-dressed, normal affect, normal respiratory effort. Examination there is no redness no cellulitis no signs of infection.  There is minimal swelling.  There is no palpable hardware.  Imaging: No results found. No images are attached to the encounter.  Labs: No results found for: "HGBA1C", "ESRSEDRATE", "CRP", "LABURIC", "REPTSTATUS", "GRAMSTAIN", "CULT", "LABORGA"   No results found for: "ALBUMIN", "PREALBUMIN", "CBC"  No results found for: "MG" No results found for: "VD25OH"  No results found for: "PREALBUMIN"    Latest Ref Rng & Units 12/23/2022    7:25 AM 12/01/2017    1:26 PM 08/02/2017    2:48 PM  CBC EXTENDED  WBC 4.0 - 10.5 K/uL 13.7  16.0  11.8   RBC 4.22 - 5.81 MIL/uL 4.48  4.51  3.99   Hemoglobin 13.0 - 17.0 g/dL 47.8  29.5  62.1   HCT 39.0 - 52.0 % 42.9  43.3  38.1   Platelets 150 - 400 K/uL 245  277  241      There is  no height or weight on file to calculate BMI.  Orders:  Orders Placed This Encounter  Procedures   XR Ankle Complete Right   No orders of the defined types were placed in this encounter.    Procedures: No procedures performed  Clinical Data: No additional findings.  ROS:  All other systems negative, except as noted in the HPI. Review of Systems  Objective: Vital Signs: There were no vitals taken for this visit.  Specialty Comments:  No specialty comments available.  PMFS History: Patient Active Problem List   Diagnosis Date Noted   Unstable ankle, right 12/23/2022   Cavovarus deformity of foot, acquired, right 12/23/2022   Lumbar stenosis with neurogenic claudication 12/07/2017   HNP (herniated nucleus pulposus) with myelopathy, cervical 08/07/2017   Past Medical History:  Diagnosis Date   Arthritis     History reviewed. No pertinent family history.  Past Surgical History:  Procedure Laterality Date   ANTERIOR CERVICAL DECOMPRESSION/DISCECTOMY FUSION 4 LEVELS N/A 08/07/2017   Procedure: ANTERIOR CERVICAL DECOMPRESSION/DISCECTOMY FUSION CERVICAL 3- CERVICAL 4, CERVICAL 4 - CERVICAL 5, CERVICAL 5- CERVICAL 6, CERVICAL 6- CERVICAL 7;  Surgeon: Shirlean Kelly, MD;  Location: [facility]  OR;  Service: Neurosurgery;  Laterality: N/A;   BACK SURGERY  2017   FOOT ARTHRODESIS Right 12/23/2022   Procedure: RIGHT TIBIOCALCANEAL FUSION;  Surgeon: Nadara Mustard, MD;  Location: [facility] OR;  Service: Orthopedics;  Laterality: Right;   Social History   Occupational History   Not on file  Tobacco Use   Smoking status: Every Day    Packs/day: 1.00    Years: 52.00    Additional pack years: 0.00    Total pack years: 52.00    Types: Cigarettes   Smokeless tobacco: Never  Vaping Use   Vaping Use: Never used  Substance and Sexual Activity   Alcohol use: No   Drug use: No   Sexual activity: Not on file

## 2023-04-11 ENCOUNTER — Ambulatory Visit: Payer: BC Managed Care – PPO | Admitting: Physical Therapy

## 2023-04-11 ENCOUNTER — Encounter: Payer: Self-pay | Admitting: Physical Therapy

## 2023-04-11 ENCOUNTER — Other Ambulatory Visit: Payer: Self-pay

## 2023-04-11 DIAGNOSIS — M25571 Pain in right ankle and joints of right foot: Secondary | ICD-10-CM

## 2023-04-11 DIAGNOSIS — M25671 Stiffness of right ankle, not elsewhere classified: Secondary | ICD-10-CM

## 2023-04-11 DIAGNOSIS — R2689 Other abnormalities of gait and mobility: Secondary | ICD-10-CM

## 2023-04-11 DIAGNOSIS — R600 Localized edema: Secondary | ICD-10-CM

## 2023-04-11 DIAGNOSIS — M6281 Muscle weakness (generalized): Secondary | ICD-10-CM | POA: Diagnosis not present

## 2023-04-11 DIAGNOSIS — R2681 Unsteadiness on feet: Secondary | ICD-10-CM

## 2023-04-11 NOTE — Therapy (Signed)
OUTPATIENT PHYSICAL THERAPY LOWER EXTREMITY EVALUATION   Patient Name: Joel Bailey MRN: [id number] DOB:08/23/1956, 67 y.o., male Today's Date: 04/11/2023  END OF SESSION:  PT End of Session - 04/11/23 1518     Visit Number 1    Number of Visits 16    Date for PT Re-Evaluation 07/12/23    Authorization Type BCBS comm PPO    Authorization Time Period $45 COPAY, DED MET, OOP Remaining $4,[phone], 24 Visits PT/OT/CHIRO COMBINED 12/12/2022    Authorization - Visit Number 1    Authorization - Number of Visits 24    Progress Note Due on Visit 10    PT Start Time 1430    PT Stop Time 1519    PT Time Calculation (min) 49 min    Equipment Utilized During Treatment Other (comment)   CAM boot   Activity Tolerance Patient tolerated treatment well    Behavior During Therapy WFL for tasks assessed/performed             Past Medical History:  Diagnosis Date   Arthritis    Past Surgical History:  Procedure Laterality Date   ANTERIOR CERVICAL DECOMPRESSION/DISCECTOMY FUSION 4 LEVELS N/A 08/07/2017   Procedure: ANTERIOR CERVICAL DECOMPRESSION/DISCECTOMY FUSION CERVICAL 3- CERVICAL 4, CERVICAL 4 - CERVICAL 5, CERVICAL 5- CERVICAL 6, CERVICAL 6- CERVICAL 7;  Surgeon: Shirlean Kelly, MD;  Location: [facility] OR;  Service: Neurosurgery;  Laterality: N/A;   BACK SURGERY  2017   FOOT ARTHRODESIS Right 12/23/2022   Procedure: RIGHT TIBIOCALCANEAL FUSION;  Surgeon: Nadara Mustard, MD;  Location: [facility] OR;  Service: Orthopedics;  Laterality: Right;   Patient Active Problem List   Diagnosis Date Noted   Unstable ankle, right 12/23/2022   Cavovarus deformity of foot, acquired, right 12/23/2022   Lumbar stenosis with neurogenic claudication 12/07/2017   HNP (herniated nucleus pulposus) with myelopathy, cervical 08/07/2017    PCP: No PCP  REFERRING PROVIDER: Aldean Baker, MD  REFERRING DIAG: Z98.1 (ICD-10-CM) - S/P ankle fusion   THERAPY DIAG:  Stiffness of right ankle, not elsewhere  classified  Muscle weakness (generalized)  Localized edema  Pain in right ankle and joints of right foot  Other abnormalities of gait and mobility  Unsteadiness on feet  Rationale for Evaluation and Treatment: Rehabilitation  ONSET DATE: 03/28/2023 MD visit with PT referral / change in WB status  SUBJECTIVE:   SUBJECTIVE STATEMENT: Patient reports increased limping since spinal surgeries in 2018 that cause his ankle to break down. He underwent right tibiocaneal fusion on 12/23/2022. He was NWBing RLE until 03/28/2023. He has been trying to walk with crutches for last 2 weeks.   PERTINENT HISTORY: Arthritis, cervical fusion sg, lumbar fusion sg  PAIN:  NPRS scale: today sitting 3-4/10 & walking 9/10 and over the last week lowest 3/10 & highest 9/10 Pain location: right ankle more in joint Pain description: sitting aches & walking sharp Aggravating factors: walking & standing Relieving factors: sit to rest & elevation  PRECAUTIONS: Fall  WEIGHT BEARING RESTRICTIONS: Yes RLE WBAT with CAM boot  FALLS:  Has patient fallen in last 6 months? No  LIVING ENVIRONMENT: Lives with: lives with their partner and dog 4# Lives in: Mobile home Stairs: Yes: External: 1 steps; none threshold into home from deck.  Ramp up to deck.  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 Lifting up to 50-60#, push / pull.  PLOF: Independent  PATIENT GOALS:  get back to work, walk in community, yard work   Next MD visit: 5/22/20924  OBJECTIVE:  DIAGNOSTIC FINDINGS: 03/28/2023 Three-view radiographs of the right ankle shows backing out of the hardware from the tibial calcaneal fusion.  With persistent lucency of the tibial talar joint.   PATIENT SURVEYS:  FOTO intake:  13%  predicted:  49%  COGNITION: Overall cognitive status: WFL    SENSATION: Light touch: Impaired   Proprioception: Impaired   EDEMA:  LLE: above ankle 25.4cm,  around ankle 36.8 cm, below ankle 28.5 cm RLE: above ankle 21.8cm,  around ankle 32 cm, below ankle 27 cm   POSTURE: rounded shoulders, forward head, flexed trunk , and weight shift left  PALPATION: No open areas, tenderness along ankle joint line, tenderness Achille's Tendon.  LOWER EXTREMITY ROM:   ROM P:passive A:active Right eval Left eval  Hip flexion    Hip extension    Hip abduction    Hip adduction    Hip internal rotation    Hip external rotation    Knee flexion    Knee extension    Ankle dorsiflexion Seated P: -15* A: -18*   Ankle plantarflexion Seated  P: 20*   Ankle inversion    Ankle eversion     (Blank rows = not tested)  LOWER EXTREMITY MMT:  MMT Right eval Left eval  Hip flexion    Hip extension    Hip abduction    Hip adduction    Hip internal rotation    Hip external rotation    Knee flexion    Knee extension    Ankle dorsiflexion 2-/5   Ankle plantarflexion 2-/5   Ankle inversion 2-/5   Ankle eversion 2-/5    (Blank rows = not tested)  GAIT: Distance walked: 100' Assistive device utilized: Crutches and CAM boot Level of assistance: SBA Comments: WBAT with antalgic pattern significant decrease RLE stance duration.    TODAY'S TREATMENT                                                                          DATE: 04/11/2023: Therex: HEP instruction/performance c cues for techniques, handout provided.  Trial set performed of each for comprehension and symptom assessment.  See below for exercise list PT recommended elevation with RLE above heart >/= 2x/day >/= 15 min.  PT demo & verbal cues on figure 8 ace wrapping.  PT recommend pt to bring compression sock to next appointment.   PATIENT EDUCATION:  Education details: HEP, POC, elevation Person educated: Patient Education method: Explanation, Demonstration, Verbal cues, and Handouts Education comprehension: verbalized  understanding, returned demonstration, and verbal cues required  HOME EXERCISE PROGRAM: Access Code: [facility] URL: [email or web address] Date: 04/11/2023 Prepared by: Vladimir Faster  Exercises - Assisted ankle dorsiflexion with strap or towel  - 2-3 x daily - 7 x weekly - 1 sets - 3 reps - 30 seconds hold - Long Sitting Soleus Stretch on Bolster with Strap  - 2-3 x daily - 7 x weekly - 1 sets - 3 reps - 30 seconds hold - Ankle Alphabet in Elevation  - 2-4 x daily - 7 x weekly - 1 sets - 1 reps - 15  minutes elevation hold - Seated Ankle Pumps  - 2-3 x daily - 7 x weekly - 3 sets - 10 reps - 5 seconds hold - Seated Toe Curl  - 2-3 x daily - 7 x weekly - 3 sets - 10 reps - 5 seconds hold  ASSESSMENT: CLINICAL IMPRESSION: Patient is a 68 y.o. who comes to clinic with complaints of right ankle pain with mobility, strength and movement coordination deficits that impair their ability to perform usual daily and recreational functional activities without increase difficulty/symptoms at this time. He had right ankle fusion 15.5 weeks ago with NWBing until 03/28/2023.  Patient to benefit from skilled PT services to address impairments and limitations to improve to previous level of function without restriction secondary to condition.   OBJECTIVE IMPAIRMENTS: Abnormal gait, decreased activity tolerance, decreased balance, decreased endurance, decreased knowledge of condition, decreased knowledge of use of DME, decreased mobility, difficulty walking, decreased ROM, decreased strength, increased edema, impaired flexibility, postural dysfunction, and pain.   ACTIVITY LIMITATIONS: carrying, lifting, bending, standing, squatting, sleeping, stairs, transfers, and locomotion level  PARTICIPATION LIMITATIONS: meal prep, cleaning, laundry, driving, shopping, community activity, occupation, and yard work  PERSONAL FACTORS: Education, Fitness, Time since onset of injury/illness/exacerbation, and 1-2  comorbidities: see PMH  are also affecting patient's functional outcome.   REHAB POTENTIAL: Good  CLINICAL DECISION MAKING: Stable/uncomplicated  EVALUATION COMPLEXITY: Low   GOALS: Goals reviewed with patient? Yes  SHORT TERM GOALS: (target date for Short term goals 05/12/2023)   1.  Patient will demonstrate independent use of home exercise program to maintain progress from in clinic treatments.  Goal status: New  2. Patient ambulates >250' & negotiates ramps / curbs safely with crutches & CAM boot WBAT RLE  Goal status: New  LONG TERM GOALS: (target dates for all long term goals  07/12/2023 )   1. Patient will demonstrate/report pain at worst less than or equal to 2/10 to facilitate minimal limitation in daily activity secondary to pain symptoms.  Goal status: New   2. Patient will demonstrate independent use of home exercise program to facilitate ability to maintain/progress functional gains from skilled physical therapy services.  Goal status: New   3. Patient will demonstrate FOTO outcome > or = 49 % to indicate reduced disability due to condition.  Goal status: New   4.  Patient will demonstrate right ankle LE MMT >3/5 throughout to faciltiate usual transfers, stairs, squatting at [facility] for daily life.   Goal status: New   5.  Patient ambulates without assistive device >500' and negotiates ramps, curbs and stairs single rail independently.  Goal status: New   6.  right ankle PROM dorsiflexion to neutral for standing activities.  Goal status: New   7.  patient demonstrates & verbalizes ability to perform work related tasks.  Goal Status: New  PLAN:  PT FREQUENCY:  1-2x/week (reduce frequency after initial 4 weeks due to high co-pay & visit limit)  PT DURATION: 14 weeks  PLANNED INTERVENTIONS: Therapeutic exercises, Therapeutic activity, Neuro Muscular re-education, Balance training, Gait training, Patient/Family education, Joint mobilization, Stair training,  DME instructions, Dry Needling, Electrical stimulation, Traction, Cryotherapy, vasopneumatic deviceMoist heat, Taping, Ultrasound, Ionotophoresis 4mg /ml Dexamethasone, and aquatic therapy, Manual therapy.  All included unless contraindicated  PLAN FOR NEXT SESSION: Review & update HEP knowledge/results. Instruct in donning compression sock. Gait Training with CAM boot & crutches including ramps & curbs.    Vladimir Faster, PT 04/11/2023, 3:52 PM

## 2023-04-17 ENCOUNTER — Encounter: Payer: Self-pay | Admitting: Physical Therapy

## 2023-04-17 ENCOUNTER — Ambulatory Visit: Payer: BC Managed Care – PPO | Admitting: Physical Therapy

## 2023-04-17 DIAGNOSIS — M6281 Muscle weakness (generalized): Secondary | ICD-10-CM | POA: Diagnosis not present

## 2023-04-17 DIAGNOSIS — M25671 Stiffness of right ankle, not elsewhere classified: Secondary | ICD-10-CM

## 2023-04-17 DIAGNOSIS — R600 Localized edema: Secondary | ICD-10-CM | POA: Diagnosis not present

## 2023-04-17 DIAGNOSIS — M25571 Pain in right ankle and joints of right foot: Secondary | ICD-10-CM

## 2023-04-17 DIAGNOSIS — R2689 Other abnormalities of gait and mobility: Secondary | ICD-10-CM

## 2023-04-17 DIAGNOSIS — R2681 Unsteadiness on feet: Secondary | ICD-10-CM

## 2023-04-17 NOTE — Therapy (Signed)
OUTPATIENT PHYSICAL THERAPY LOWER EXTREMITY TREATMENT   Patient Name: Joel Bailey MRN: [id number] DOB:1956/01/17, 67 y.o., male Today's Date: 04/17/2023  END OF SESSION:  PT End of Session - 04/17/23 1431     Visit Number 2    Number of Visits 16    Date for PT Re-Evaluation 07/12/23    Authorization Type BCBS comm PPO    Authorization Time Period $45 COPAY, DED MET, OOP Remaining $4,058.48, 24 Visits PT/OT/CHIRO COMBINED 12/12/2022    Authorization - Visit Number 2    Authorization - Number of Visits 24    Progress Note Due on Visit 10    PT Start Time 1431    PT Stop Time 1525    PT Time Calculation (min) 54 min    Equipment Utilized During Treatment Other (comment)   CAM boot   Activity Tolerance Patient tolerated treatment well    Behavior During Therapy WFL for tasks assessed/performed              Past Medical History:  Diagnosis Date   Arthritis    Past Surgical History:  Procedure Laterality Date   ANTERIOR CERVICAL DECOMPRESSION/DISCECTOMY FUSION 4 LEVELS N/A 08/07/2017   Procedure: ANTERIOR CERVICAL DECOMPRESSION/DISCECTOMY FUSION CERVICAL 3- CERVICAL 4, CERVICAL 4 - CERVICAL 5, CERVICAL 5- CERVICAL 6, CERVICAL 6- CERVICAL 7;  Surgeon: Shirlean Kelly, MD;  Location: [facility] OR;  Service: Neurosurgery;  Laterality: N/A;   BACK SURGERY  2017   FOOT ARTHRODESIS Right 12/23/2022   Procedure: RIGHT TIBIOCALCANEAL FUSION;  Surgeon: Nadara Mustard, MD;  Location: [facility] OR;  Service: Orthopedics;  Laterality: Right;   Patient Active Problem List   Diagnosis Date Noted   Unstable ankle, right 12/23/2022   Cavovarus deformity of foot, acquired, right 12/23/2022   Lumbar stenosis with neurogenic claudication 12/07/2017   HNP (herniated nucleus pulposus) with myelopathy, cervical 08/07/2017    PCP: No PCP  REFERRING PROVIDER: Aldean Baker, MD  REFERRING DIAG: Z98.1 (ICD-10-CM) - S/P ankle fusion   THERAPY DIAG:  Stiffness of right ankle, not elsewhere  classified  Muscle weakness (generalized)  Localized edema  Pain in right ankle and joints of right foot  Other abnormalities of gait and mobility  Unsteadiness on feet  Rationale for Evaluation and Treatment: Rehabilitation  ONSET DATE: 03/28/2023 MD visit with PT referral / change in WB status  SUBJECTIVE:   SUBJECTIVE STATEMENT: He has been doing the exercises with no issues.  He has been walking with crutches with CAM boot.  He uses kneel walker in house without the CAM boot.    PERTINENT HISTORY: Arthritis, cervical fusion sg, lumbar fusion sg  PAIN:  NPRS scale: today sitting 3-4/10 & walking 9/10 and over the last week lowest 3/10 & highest 9/10 Pain location: right ankle more in joint Pain description: sitting aches & walking sharp Aggravating factors: walking & standing Relieving factors: sit to rest & elevation  PRECAUTIONS: Fall  WEIGHT BEARING RESTRICTIONS: Yes RLE WBAT with CAM boot  FALLS:  Has patient fallen in last 6 months? No  LIVING ENVIRONMENT: Lives with: lives with their partner and dog 4# Lives in: Mobile home Stairs: Yes: External: 1 steps; none threshold into home from deck.  Ramp up to deck.  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 Lifting up to 50-60#, push / pull.   PLOF: Independent  PATIENT GOALS:  get  back to work, walk in community, yard work   Next MD visit: 5/22/20924  OBJECTIVE:  DIAGNOSTIC FINDINGS: 03/28/2023 Three-view radiographs of the right ankle shows backing out of the hardware from the tibial calcaneal fusion.  With persistent lucency of the tibial talar joint.   PATIENT SURVEYS:  FOTO intake:  13%  predicted:  49%  COGNITION: Overall cognitive status: WFL    SENSATION: Light touch: Impaired  Proprioception: Impaired   EDEMA:  LLE: above ankle 25.4cm,  around ankle 36.8 cm, below  ankle 28.5 cm RLE: above ankle 21.8cm,  around ankle 32 cm, below ankle 27 cm   POSTURE: rounded shoulders, forward head, flexed trunk , and weight shift left  PALPATION: No open areas, tenderness along ankle joint line, tenderness Achille's Tendon.  LOWER EXTREMITY ROM:   ROM P:passive A:active Right eval Left eval  Hip flexion    Hip extension    Hip abduction    Hip adduction    Hip internal rotation    Hip external rotation    Knee flexion    Knee extension    Ankle dorsiflexion Seated P: -15* A: -18*   Ankle plantarflexion Seated  P: 20*   Ankle inversion    Ankle eversion     (Blank rows = not tested)  LOWER EXTREMITY MMT:  MMT Right eval Left eval  Hip flexion    Hip extension    Hip abduction    Hip adduction    Hip internal rotation    Hip external rotation    Knee flexion    Knee extension    Ankle dorsiflexion 2-/5   Ankle plantarflexion 2-/5   Ankle inversion 2-/5   Ankle eversion 2-/5    (Blank rows = not tested)  GAIT: Distance walked: 100' Assistive device utilized: Crutches and CAM boot Level of assistance: SBA Comments: WBAT with antalgic pattern significant decrease RLE stance duration.    TODAY'S TREATMENT                                                                          DATE: 04/17/2023:  Gait Training: PT demo & verbal cues on WBAT with CAM boot & crutches 3 point pattern. Pt return demo understanding including exiting PT.   Manual therapy:  PROM / AAROM right anke DF & PF, great toe flex/ext and 4 lesser toe flex /ext  Therapeutic exercise: Seated inversion / eversion with forefoot on towel 20 reps. Seated towel heel slides with active DF & functional gentle closed chain DF 20 reps. Seated ankle PF with knee flexed to 90* 20 reps Picking up 10 marbles moving to cup anterior for DF, to cup inward for inversion & to cup outward for eversion.  RLE Straight leg raise supine, right sidelying adduction, prone hip extension &  left sidelying abduction 10 reps ea.   Vaso for edema right ankle 34* medium compression 10 min with elevation.    04/11/2023: Therex: HEP instruction/performance c cues for techniques, handout provided.  Trial set performed of each for comprehension and symptom assessment.  See below for exercise list PT recommended elevation with RLE above heart >/= 2x/day >/= 15 min.  PT demo & verbal cues on figure 8 ace wrapping.  PT recommend pt to bring compression sock to next appointment.   PATIENT EDUCATION:  Education details: HEP, POC, elevation Person educated: Patient Education method: Explanation, Demonstration, Verbal cues, and Handouts Education comprehension: verbalized understanding, returned demonstration, and verbal cues required  HOME EXERCISE PROGRAM: Access Code: [facility] URL: [email or web address] Date: 04/11/2023 Prepared by: Vladimir Faster  Exercises - Assisted ankle dorsiflexion with strap or towel  - 2-3 x daily - 7 x weekly - 1 sets - 3 reps - 30 seconds hold - Long Sitting Soleus Stretch on Bolster with Strap  - 2-3 x daily - 7 x weekly - 1 sets - 3 reps - 30 seconds hold - Ankle Alphabet in Elevation  - 2-4 x daily - 7 x weekly - 1 sets - 1 reps - 15 minutes elevation hold - Seated Ankle Pumps  - 2-3 x daily - 7 x weekly - 3 sets - 10 reps - 5 seconds hold - Seated Toe Curl  - 2-3 x daily - 7 x weekly - 3 sets - 10 reps - 5 seconds hold  ASSESSMENT: CLINICAL IMPRESSION: Patient appeared to have more AROM today.  Patient was able to perform more exercises today.  He improved gait with WBAT in CAM boot.   OBJECTIVE IMPAIRMENTS: Abnormal gait, decreased activity tolerance, decreased balance, decreased endurance, decreased knowledge of condition, decreased knowledge of use of DME, decreased mobility, difficulty walking, decreased ROM, decreased strength, increased edema, impaired flexibility, postural dysfunction, and pain.   ACTIVITY LIMITATIONS: carrying,  lifting, bending, standing, squatting, sleeping, stairs, transfers, and locomotion level  PARTICIPATION LIMITATIONS: meal prep, cleaning, laundry, driving, shopping, community activity, occupation, and yard work  PERSONAL FACTORS: Education, Fitness, Time since onset of injury/illness/exacerbation, and 1-2 comorbidities: see PMH  are also affecting patient's functional outcome.   REHAB POTENTIAL: Good  CLINICAL DECISION MAKING: Stable/uncomplicated  EVALUATION COMPLEXITY: Low   GOALS: Goals reviewed with patient? Yes  SHORT TERM GOALS: (target date for Short term goals 05/12/2023)   1.  Patient will demonstrate independent use of home exercise program to maintain progress from in clinic treatments.  Goal status: New  2. Patient ambulates >250' & negotiates ramps / curbs safely with crutches & CAM boot WBAT RLE  Goal status: New  LONG TERM GOALS: (target dates for all long term goals  07/12/2023 )   1. Patient will demonstrate/report pain at worst less than or equal to 2/10 to facilitate minimal limitation in daily activity secondary to pain symptoms.  Goal status: New   2. Patient will demonstrate independent use of home exercise program to facilitate ability to maintain/progress functional gains from skilled physical therapy services.  Goal status: New   3. Patient will demonstrate FOTO outcome > or = 49 % to indicate reduced disability due to condition.  Goal status: New   4.  Patient will demonstrate right ankle LE MMT >3/5 throughout to faciltiate usual transfers, stairs, squatting at [facility] for daily life.   Goal status: New   5.  Patient ambulates without assistive device >500' and negotiates ramps, curbs and stairs single rail independently.  Goal status: New   6.  right ankle PROM dorsiflexion to neutral for standing activities.  Goal status: New   7.  patient demonstrates & verbalizes ability to perform work related tasks.  Goal Status: New  PLAN:  PT  FREQUENCY:  1-2x/week (reduce frequency after initial 4 weeks due to high co-pay & visit limit)  PT DURATION: 14 weeks  PLANNED INTERVENTIONS: Therapeutic exercises, Therapeutic activity,  Neuro Muscular re-education, Balance training, Gait training, Patient/Family education, Joint mobilization, Stair training, DME instructions, Dry Needling, Electrical stimulation, Traction, Cryotherapy, vasopneumatic deviceMoist heat, Taping, Ultrasound, Ionotophoresis 4mg /ml Dexamethasone, and aquatic therapy, Manual therapy.  All included unless contraindicated  PLAN FOR NEXT SESSION:  update HEP to include marbles & 4 way SLRs. If he brings compression sock, then instruct in donning compression sock. Continue Gait Training with CAM boot & crutches including ramps & curbs.    Vladimir Faster, PT, DPT 04/17/2023, 4:34 PM

## 2023-04-19 ENCOUNTER — Ambulatory Visit: Payer: BC Managed Care – PPO | Admitting: Physical Therapy

## 2023-04-19 ENCOUNTER — Encounter: Payer: Self-pay | Admitting: Physical Therapy

## 2023-04-19 DIAGNOSIS — M25571 Pain in right ankle and joints of right foot: Secondary | ICD-10-CM | POA: Diagnosis not present

## 2023-04-19 DIAGNOSIS — M25671 Stiffness of right ankle, not elsewhere classified: Secondary | ICD-10-CM

## 2023-04-19 DIAGNOSIS — M6281 Muscle weakness (generalized): Secondary | ICD-10-CM | POA: Diagnosis not present

## 2023-04-19 DIAGNOSIS — R600 Localized edema: Secondary | ICD-10-CM

## 2023-04-19 DIAGNOSIS — R2689 Other abnormalities of gait and mobility: Secondary | ICD-10-CM

## 2023-04-19 DIAGNOSIS — R2681 Unsteadiness on feet: Secondary | ICD-10-CM

## 2023-04-19 NOTE — Therapy (Signed)
OUTPATIENT PHYSICAL THERAPY LOWER EXTREMITY TREATMENT   Patient Name: Joel Bailey MRN: [id number] DOB:[birth year], 67 y.o., male Today's Date: 04/19/2023  END OF SESSION:  PT End of Session - 04/19/23 1516     Visit Number 3    Number of Visits 16    Date for PT Re-Evaluation 07/12/23    Authorization Type BCBS comm PPO    Authorization Time Period $45 COPAY, DED MET, OOP Remaining $4,058.48, 24 Visits PT/OT/CHIRO COMBINED 12/12/2022    Authorization - Visit Number 3    Authorization - Number of Visits 24    Progress Note Due on Visit 10    PT Start Time 1515    PT Stop Time 1608    PT Time Calculation (min) 53 min    Equipment Utilized During Treatment Other (comment)   CAM boot   Activity Tolerance Patient tolerated treatment well    Behavior During Therapy WFL for tasks assessed/performed               Past Medical History:  Diagnosis Date   Arthritis    Past Surgical History:  Procedure Laterality Date   ANTERIOR CERVICAL DECOMPRESSION/DISCECTOMY FUSION 4 LEVELS N/A 08/07/2017   Procedure: ANTERIOR CERVICAL DECOMPRESSION/DISCECTOMY FUSION CERVICAL 3- CERVICAL 4, CERVICAL 4 - CERVICAL 5, CERVICAL 5- CERVICAL 6, CERVICAL 6- CERVICAL 7;  Surgeon: Shirlean Kelly, MD;  Location: [facility] OR;  Service: Neurosurgery;  Laterality: N/A;   BACK SURGERY  2017   FOOT ARTHRODESIS Right 12/23/2022   Procedure: RIGHT TIBIOCALCANEAL FUSION;  Surgeon: Nadara Mustard, MD;  Location: [facility] OR;  Service: Orthopedics;  Laterality: Right;   Patient Active Problem List   Diagnosis Date Noted   Unstable ankle, right 12/23/2022   Cavovarus deformity of foot, acquired, right 12/23/2022   Lumbar stenosis with neurogenic claudication 12/07/2017   HNP (herniated nucleus pulposus) with myelopathy, cervical 08/07/2017    PCP: No PCP  REFERRING PROVIDER: Aldean Baker, MD  REFERRING DIAG: Z98.1 (ICD-10-CM) - S/P ankle fusion   THERAPY DIAG:  Stiffness of right ankle, not elsewhere  classified  Muscle weakness (generalized)  Localized edema  Pain in right ankle and joints of right foot  Other abnormalities of gait and mobility  Unsteadiness on feet  Rationale for Evaluation and Treatment: Rehabilitation  ONSET DATE: 03/28/2023 MD visit with PT referral / change in WB status  SUBJECTIVE:   SUBJECTIVE STATEMENT: Wrapping ankle is controlling swelling.  His work called and was questioning return to work on 5/23. He got new PW from MD stating he is not ready to return to work.   PERTINENT HISTORY: Arthritis, cervical fusion sg, lumbar fusion sg  PAIN:  NPRS scale: today sitting  4/10 & walking 9/10 and over the last week lowest 3/10 & highest 9/10 Pain location: right ankle more in joint Pain description: sitting aches & walking sharp Aggravating factors: walking & standing Relieving factors: sit to rest & elevation  PRECAUTIONS: Fall  WEIGHT BEARING RESTRICTIONS: Yes RLE WBAT with CAM boot  FALLS:  Has patient fallen in last 6 months? No  LIVING ENVIRONMENT: Lives with: lives with their partner and dog 4# Lives in: Mobile home Stairs: Yes: External: 1 steps; none threshold into home from deck.  Ramp up to deck.  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 Lifting up to 50-60#, push / pull.   PLOF: Independent  PATIENT  GOALS:  get back to work, walk in community, yard work   Next MD visit: 5/22/20924  OBJECTIVE:  DIAGNOSTIC FINDINGS: 03/28/2023 Three-view radiographs of the right ankle shows backing out of the hardware from the tibial calcaneal fusion.  With persistent lucency of the tibial talar joint.   PATIENT SURVEYS:  Eval / 04/11/2023:   FOTO intake:  13%  predicted:  49%  COGNITION: Overall cognitive status: WFL    SENSATION: Eval / 04/11/2023:    Light touch: Impaired  Proprioception: Impaired   EDEMA:   Eval / 04/11/2023:    LLE: above ankle 25.4cm,  around ankle 36.8 cm, below ankle 28.5 cm RLE: above ankle 21.8cm,  around ankle 32 cm, below ankle 27 cm   POSTURE: Eval / 04/11/2023:    rounded shoulders, forward head, flexed trunk , and weight shift left  PALPATION: Eval / 04/11/2023:   No open areas, tenderness along ankle joint line, tenderness Achille's Tendon.  LOWER EXTREMITY ROM:   ROM P:passive A:active Right Eval 04/11/23  Hip flexion   Hip extension   Hip abduction   Hip adduction   Hip internal rotation   Hip external rotation   Knee flexion   Knee extension   Ankle dorsiflexion Seated P: -15* A: -18*  Ankle plantarflexion Seated  P: 20*  Ankle inversion   Ankle eversion    (Blank rows = not tested)  LOWER EXTREMITY MMT:  MMT Right Eval 04/11/23  Hip flexion   Hip extension   Hip abduction   Hip adduction   Hip internal rotation   Hip external rotation   Knee flexion   Knee extension   Ankle dorsiflexion 2-/5  Ankle plantarflexion 2-/5  Ankle inversion 2-/5  Ankle eversion 2-/5   (Blank rows = not tested)  GAIT: 04/19/2023: pt amb >200' with CAM boot & crutches WBAT safely.   Eval / 04/11/2023:   Distance walked: 100' Assistive device utilized: Crutches and CAM boot Level of assistance: SBA Comments: WBAT with antalgic pattern significant decrease RLE stance duration.    TODAY'S TREATMENT                                                                          DATE: 04/19/2023: Therapeutic exercise: Seated inversion / eversion with forefoot on towel 20 reps. Seated towel heel slides with active DF & functional gentle closed chain DF 20 reps. Seated ankle PF with knee flexed to 90* 20 reps Picking up 10 marbles moving to cup anterior for DF, to cup inward for inversion & to cup outward for eversion.  RLE Straight leg raise supine, right sidelying adduction, prone hip extension & left sidelying abduction 10 reps ea. With CAM boot for weight.  PT  updated HEP with demo, verbal & HO. Pt verbalized understanding.   Manual therapy:  PROM / AAROM right anke DF & PF, great toe flex/ext and 4 lesser toe flex /ext  Self-care: PT demo & verbal cues on donning compression sock. Pt verbalized understanding.   Vaso for edema dual hose right ankle & knee 34* medium compression 10 min with elevation.   04/17/2023:  Gait Training: PT demo & verbal cues on WBAT with CAM boot & crutches 3 point pattern.  Pt return demo understanding including exiting PT.   Manual therapy:  PROM / AAROM right anke DF & PF, great toe flex/ext and 4 lesser toe flex /ext  Therapeutic exercise: Seated inversion / eversion with forefoot on towel 20 reps. Seated towel heel slides with active DF & functional gentle closed chain DF 20 reps. Seated ankle PF with knee flexed to 90* 20 reps Picking up 10 marbles moving to cup anterior for DF, to cup inward for inversion & to cup outward for eversion.  RLE Straight leg raise supine, right sidelying adduction, prone hip extension & left sidelying abduction 10 reps ea.   Vaso for edema right ankle 34* medium compression 10 min with elevation.    04/11/2023: Therex: HEP instruction/performance c cues for techniques, handout provided.  Trial set performed of each for comprehension and symptom assessment.  See below for exercise list PT recommended elevation with RLE above heart >/= 2x/day >/= 15 min.  PT demo & verbal cues on figure 8 ace wrapping.  PT recommend pt to bring compression sock to next appointment.   PATIENT EDUCATION:  Education details: HEP, POC, elevation Person educated: Patient Education method: Explanation, Demonstration, Verbal cues, and Handouts Education comprehension: verbalized understanding, returned demonstration, and verbal cues required  HOME EXERCISE PROGRAM: Access Code: [facility] URL: [email or web address] Date: 04/19/2023 Prepared by: Vladimir Faster  Exercises - Assisted  ankle dorsiflexion with strap or towel  - 2-3 x daily - 7 x weekly - 1 sets - 3 reps - 30 seconds hold - Long Sitting Soleus Stretch on Bolster with Strap  - 2-3 x daily - 7 x weekly - 1 sets - 3 reps - 30 seconds hold - Ankle Alphabet in Elevation  - 2-4 x daily - 7 x weekly - 1 sets - 1 reps - 15 minutes elevation hold - Seated Ankle Pumps  - 2-3 x daily - 7 x weekly - 3 sets - 10 reps - 5 seconds hold - Seated Toe Curl  - 2-3 x daily - 7 x weekly - 3 sets - 10 reps - 5 seconds hold - Supine Straight Leg Raises  - 1 x daily - 7 x weekly - 1 sets - 10 reps - 5 seconds hold - Sidelying Hip Abduction  - 1 x daily - 7 x weekly - 1 sets - 10 reps - 5 seconds hold - Prone Hip Extension  - 1 x daily - 7 x weekly - 1 sets - 10 reps - 5 seconds hold - Sidelying Hip Adduction (No weight)  - 1 x daily - 7 x weekly - 1 sets - 10 reps - 5 seconds hold - Seated Heel Raise  - 1 x daily - 7 x weekly - 1 sets - 10 reps - 5 seconds hold - Seated Heel Slide  - 1 x daily - 7 x weekly - 1 sets - 10 reps - 5 seconds hold - Ankle Inversion Eversion Towel Slide  - 1 x daily - 7 x weekly - 1 sets - 10 reps - 5 seconds hold - Seated Marble Transfer with Toes  - 1 x daily - 7 x weekly - 3 sets - 10 reps  ASSESSMENT: CLINICAL IMPRESSION: Patient appears to understand updated HEP.  He needed 2XL compression sock.  His toenails are long impeding donning compression sock some what.   OBJECTIVE IMPAIRMENTS: Abnormal gait, decreased activity tolerance, decreased balance, decreased endurance, decreased knowledge of condition, decreased knowledge of use of DME, decreased mobility, difficulty  walking, decreased ROM, decreased strength, increased edema, impaired flexibility, postural dysfunction, and pain.   ACTIVITY LIMITATIONS: carrying, lifting, bending, standing, squatting, sleeping, stairs, transfers, and locomotion level  PARTICIPATION LIMITATIONS: meal prep, cleaning, laundry, driving, shopping, community activity,  occupation, and yard work  PERSONAL FACTORS: Education, Fitness, Time since onset of injury/illness/exacerbation, and 1-2 comorbidities: see PMH  are also affecting patient's functional outcome.   REHAB POTENTIAL: Good  CLINICAL DECISION MAKING: Stable/uncomplicated  EVALUATION COMPLEXITY: Low   GOALS: Goals reviewed with patient? Yes  SHORT TERM GOALS: (target date for Short term goals 05/12/2023)   1.  Patient will demonstrate independent use of home exercise program to maintain progress from in clinic treatments.  Goal status: ongoing 04/19/2023  2. Patient ambulates >250' & negotiates ramps / curbs safely with crutches & CAM boot WBAT RLE  Goal status: ongoing 04/19/2023  LONG TERM GOALS: (target dates for all long term goals  07/12/2023 )   1. Patient will demonstrate/report pain at worst less than or equal to 2/10 to facilitate minimal limitation in daily activity secondary to pain symptoms.  Goal status: ongoing 04/19/2023   2. Patient will demonstrate independent use of home exercise program to facilitate ability to maintain/progress functional gains from skilled physical therapy services.  Goal status: ongoing 04/19/2023   3. Patient will demonstrate FOTO outcome > or = 49 % to indicate reduced disability due to condition.  Goal status: ongoing 04/19/2023   4.  Patient will demonstrate right ankle LE MMT >3/5 throughout to faciltiate usual transfers, stairs, squatting at [facility] for daily life.   Goal status: ongoing 04/19/2023   5.  Patient ambulates without assistive device >500' and negotiates ramps, curbs and stairs single rail independently.  Goal status: ongoing 04/19/2023   6.  right ankle PROM dorsiflexion to neutral for standing activities.  Goal status: ongoing 04/19/2023   7.  patient demonstrates & verbalizes ability to perform work related tasks.  Goal Status: ongoing 04/19/2023  PLAN:  PT FREQUENCY:  1-2x/week (reduce frequency after initial 4 weeks due to high  co-pay & visit limit)  PT DURATION: 14 weeks  PLANNED INTERVENTIONS: Therapeutic exercises, Therapeutic activity, Neuro Muscular re-education, Balance training, Gait training, Patient/Family education, Joint mobilization, Stair training, DME instructions, Dry Needling, Electrical stimulation, Traction, Cryotherapy, vasopneumatic deviceMoist heat, Taping, Ultrasound, Ionotophoresis 4mg /ml Dexamethasone, and aquatic therapy, Manual therapy.  All included unless contraindicated  PLAN FOR NEXT SESSION:   ankle AROM, try quadruped exercises, gentle ROM manually, vaso for edema management.    Vladimir Faster, PT, DPT 04/19/2023, 4:16 PM

## 2023-04-25 ENCOUNTER — Ambulatory Visit: Payer: BC Managed Care – PPO | Admitting: Physical Therapy

## 2023-04-25 ENCOUNTER — Encounter: Payer: Self-pay | Admitting: Physical Therapy

## 2023-04-25 DIAGNOSIS — M6281 Muscle weakness (generalized): Secondary | ICD-10-CM | POA: Diagnosis not present

## 2023-04-25 DIAGNOSIS — M25671 Stiffness of right ankle, not elsewhere classified: Secondary | ICD-10-CM

## 2023-04-25 DIAGNOSIS — R2681 Unsteadiness on feet: Secondary | ICD-10-CM

## 2023-04-25 DIAGNOSIS — M25571 Pain in right ankle and joints of right foot: Secondary | ICD-10-CM

## 2023-04-25 DIAGNOSIS — R600 Localized edema: Secondary | ICD-10-CM | POA: Diagnosis not present

## 2023-04-25 DIAGNOSIS — R2689 Other abnormalities of gait and mobility: Secondary | ICD-10-CM

## 2023-04-25 NOTE — Therapy (Signed)
OUTPATIENT PHYSICAL THERAPY LOWER EXTREMITY TREATMENT   Patient Name: Joel Bailey MRN: [id number] DOB:Sep 06, 1956, 67 y.o., male Today's Date: 04/25/2023  END OF SESSION:  PT End of Session - 04/25/23 1437     Visit Number 4    Number of Visits 16    Date for PT Re-Evaluation 07/12/23    Authorization Type [employer] comm PPO    Authorization - Visit Number 4    Authorization - Number of Visits 24    Progress Note Due on Visit 10    PT Start Time 1433    PT Stop Time 1513    PT Time Calculation (min) 40 min    Activity Tolerance Patient tolerated treatment well    Behavior During Therapy WFL for tasks assessed/performed               Past Medical History:  Diagnosis Date   Arthritis    Past Surgical History:  Procedure Laterality Date   ANTERIOR CERVICAL DECOMPRESSION/DISCECTOMY FUSION 4 LEVELS N/A 08/07/2017   Procedure: ANTERIOR CERVICAL DECOMPRESSION/DISCECTOMY FUSION CERVICAL 3- CERVICAL 4, CERVICAL 4 - CERVICAL 5, CERVICAL 5- CERVICAL 6, CERVICAL 6- CERVICAL 7;  Surgeon: Shirlean Kelly, MD;  Location: [facility] OR;  Service: Neurosurgery;  Laterality: N/A;   BACK SURGERY  2017   FOOT ARTHRODESIS Right 12/23/2022   Procedure: RIGHT TIBIOCALCANEAL FUSION;  Surgeon: Nadara Mustard, MD;  Location: [facility] OR;  Service: Orthopedics;  Laterality: Right;   Patient Active Problem List   Diagnosis Date Noted   Unstable ankle, right 12/23/2022   Cavovarus deformity of foot, acquired, right 12/23/2022   Lumbar stenosis with neurogenic claudication 12/07/2017   HNP (herniated nucleus pulposus) with myelopathy, cervical 08/07/2017    PCP: No PCP  REFERRING PROVIDER: Aldean Baker, MD  REFERRING DIAG: Z98.1 (ICD-10-CM) - S/P ankle fusion   THERAPY DIAG:  Stiffness of right ankle, not elsewhere classified  Muscle weakness (generalized)  Localized edema  Pain in right ankle and joints of right foot  Other abnormalities of gait and mobility  Unsteadiness on feet  Rationale for  Evaluation and Treatment: Rehabilitation  ONSET DATE: 03/28/2023 MD visit with PT referral / change in WB status  SUBJECTIVE:   SUBJECTIVE STATEMENT: Wrapping ankle is controlling swelling.  His work called and was questioning return to work on 5/23. He got new PW from MD stating he is not ready to return to work.   PERTINENT HISTORY: Arthritis, cervical fusion sg, lumbar fusion sg  PAIN:  NPRS scale: today sitting  4/10 & walking 9/10 and over the last week lowest 3/10 & highest 9/10 Pain location: right ankle more in joint Pain description: sitting aches & walking sharp Aggravating factors: walking & standing Relieving factors: sit to rest & elevation  PRECAUTIONS: Fall  WEIGHT BEARING RESTRICTIONS: Yes RLE WBAT with CAM boot  FALLS:  Has patient fallen in last 6 months? No  LIVING ENVIRONMENT: Lives with: lives with their partner and dog 4# Lives in: Mobile home Stairs: Yes: External: 1 steps; none threshold into home from deck.  Ramp up to deck.  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 Lifting up to 50-60#, push / pull.   PLOF: Independent  PATIENT GOALS:  get back to work, walk in community, yard work   Next MD visit: 5/22/20924  OBJECTIVE:  DIAGNOSTIC FINDINGS: 03/28/2023 Three-view radiographs of the right ankle shows  backing out of the hardware from the tibial calcaneal fusion.  With persistent lucency of the tibial talar joint.   PATIENT SURVEYS:  Eval / 04/11/2023:   FOTO intake:  13%  predicted:  49%  COGNITION: Overall cognitive status: WFL    SENSATION: Eval / 04/11/2023:    Light touch: Impaired  Proprioception: Impaired   EDEMA:  Eval / 04/11/2023:    LLE: above ankle 25.4cm,  around ankle 36.8 cm, below ankle 28.5 cm RLE: above ankle 21.8cm,  around ankle 32 cm, below ankle 27 cm   POSTURE: Eval / 04/11/2023:    rounded  shoulders, forward head, flexed trunk , and weight shift left  PALPATION: Eval / 04/11/2023:   No open areas, tenderness along ankle joint line, tenderness Achille's Tendon.  LOWER EXTREMITY ROM:   ROM P:passive A:active Right Eval 04/11/23  Hip flexion   Hip extension   Hip abduction   Hip adduction   Hip internal rotation   Hip external rotation   Knee flexion   Knee extension   Ankle dorsiflexion Seated P: -15* A: -18*  Ankle plantarflexion Seated  P: 20*  Ankle inversion   Ankle eversion    (Blank rows = not tested)  LOWER EXTREMITY MMT:  MMT Right Eval 04/11/23  Hip flexion   Hip extension   Hip abduction   Hip adduction   Hip internal rotation   Hip external rotation   Knee flexion   Knee extension   Ankle dorsiflexion 2-/5  Ankle plantarflexion 2-/5  Ankle inversion 2-/5  Ankle eversion 2-/5   (Blank rows = not tested)  GAIT: 04/19/2023: pt amb >200' with CAM boot & crutches WBAT safely.   Eval / 04/11/2023:   Distance walked: 100' Assistive device utilized: Crutches and CAM boot Level of assistance: SBA Comments: WBAT with antalgic pattern significant decrease RLE stance duration.    TODAY'S TREATMENT                                                                          DATE: 04/25/23:  TherEx:  Seated heel slides with towel under Rt foot x 20  Seated rocker board x 2 minutes both direction Attempting towel scrunches: x 1 minute Inversion/eversion; c towel under Rt foot x 20 each direction (windshield wiper motion) Seated heel slides: Rt heel staying on the floor x 15 4 way ankle exercises: Level 2 band: x 20 each  Modalities:  Vasopneumatic: 34 degrees, medium compression, x 10 minutes, Rt LE elevated with bolster and pillow      04/19/2023: Therapeutic exercise: Seated inversion / eversion with forefoot on towel 20 reps. Seated towel heel slides with active DF & functional gentle closed chain DF 20 reps. Seated ankle PF with knee flexed  to 90* 20 reps Picking up 10 marbles moving to cup anterior for DF, to cup inward for inversion & to cup outward for eversion.  RLE Straight leg raise supine, right sidelying adduction, prone hip extension & left sidelying abduction 10 reps ea. With CAM boot for weight.  PT updated HEP with demo, verbal & HO. Pt verbalized understanding.   Manual therapy:  PROM / AAROM right anke DF & PF, great toe flex/ext and 4 lesser  toe flex /ext  Self-care: PT demo & verbal cues on donning compression sock. Pt verbalized understanding.   Vaso for edema dual hose right ankle & knee 34* medium compression 10 min with elevation.   04/17/2023:  Gait Training: PT demo & verbal cues on WBAT with CAM boot & crutches 3 point pattern. Pt return demo understanding including exiting PT.   Manual therapy:  PROM / AAROM right anke DF & PF, great toe flex/ext and 4 lesser toe flex /ext  Therapeutic exercise: Seated inversion / eversion with forefoot on towel 20 reps. Seated towel heel slides with active DF & functional gentle closed chain DF 20 reps. Seated ankle PF with knee flexed to 90* 20 reps Picking up 10 marbles moving to cup anterior for DF, to cup inward for inversion & to cup outward for eversion.  RLE Straight leg raise supine, right sidelying adduction, prone hip extension & left sidelying abduction 10 reps ea.   Vaso for edema right ankle 34* medium compression 10 min with elevation.    04/11/2023: Therex: HEP instruction/performance c cues for techniques, handout provided.  Trial set performed of each for comprehension and symptom assessment.  See below for exercise list PT recommended elevation with RLE above heart >/= 2x/day >/= 15 min.  PT demo & verbal cues on figure 8 ace wrapping.  PT recommend pt to bring compression sock to next appointment.   PATIENT EDUCATION:  Education details: HEP, POC, elevation Person educated: Patient Education method: Explanation, Demonstration, Verbal cues,  and Handouts Education comprehension: verbalized understanding, returned demonstration, and verbal cues required  HOME EXERCISE PROGRAM: Access Code: [facility] URL: [email or web address] Date: 04/19/2023 Prepared by: Vladimir Faster  Exercises - Assisted ankle dorsiflexion with strap or towel  - 2-3 x daily - 7 x weekly - 1 sets - 3 reps - 30 seconds hold - Long Sitting Soleus Stretch on Bolster with Strap  - 2-3 x daily - 7 x weekly - 1 sets - 3 reps - 30 seconds hold - Ankle Alphabet in Elevation  - 2-4 x daily - 7 x weekly - 1 sets - 1 reps - 15 minutes elevation hold - Seated Ankle Pumps  - 2-3 x daily - 7 x weekly - 3 sets - 10 reps - 5 seconds hold - Seated Toe Curl  - 2-3 x daily - 7 x weekly - 3 sets - 10 reps - 5 seconds hold - Supine Straight Leg Raises  - 1 x daily - 7 x weekly - 1 sets - 10 reps - 5 seconds hold - Sidelying Hip Abduction  - 1 x daily - 7 x weekly - 1 sets - 10 reps - 5 seconds hold - Prone Hip Extension  - 1 x daily - 7 x weekly - 1 sets - 10 reps - 5 seconds hold - Sidelying Hip Adduction (No weight)  - 1 x daily - 7 x weekly - 1 sets - 10 reps - 5 seconds hold - Seated Heel Raise  - 1 x daily - 7 x weekly - 1 sets - 10 reps - 5 seconds hold - Seated Heel Slide  - 1 x daily - 7 x weekly - 1 sets - 10 reps - 5 seconds hold - Ankle Inversion Eversion Towel Slide  - 1 x daily - 7 x weekly - 1 sets - 10 reps - 5 seconds hold - Seated Marble Transfer with Toes  - 1 x daily - 7 x weekly - 3 sets -  10 reps  ASSESSMENT: CLINICAL IMPRESSION: Pt arriving today reporting 4/10 pain in his Rt ankle at rest and 9/10 pain with movements. Pt amb with bilateral axillary crutches with CAM boot on Rt LE. Pt with increased edema today and stating he could not donn his compression stock this morning. Pt arriving with ace bandage. Pt's with some limitations due to increased pain and reporting he has not been as consistent with her HEP. Pt encourage to perform exercises  throughout the day. Continue skilled PT to maximize pt's function.   OBJECTIVE IMPAIRMENTS: Abnormal gait, decreased activity tolerance, decreased balance, decreased endurance, decreased knowledge of condition, decreased knowledge of use of DME, decreased mobility, difficulty walking, decreased ROM, decreased strength, increased edema, impaired flexibility, postural dysfunction, and pain.   ACTIVITY LIMITATIONS: carrying, lifting, bending, standing, squatting, sleeping, stairs, transfers, and locomotion level  PARTICIPATION LIMITATIONS: meal prep, cleaning, laundry, driving, shopping, community activity, occupation, and yard work  PERSONAL FACTORS: Education, Fitness, Time since onset of injury/illness/exacerbation, and 1-2 comorbidities: see PMH  are also affecting patient's functional outcome.   REHAB POTENTIAL: Good  CLINICAL DECISION MAKING: Stable/uncomplicated  EVALUATION COMPLEXITY: Low   GOALS: Goals reviewed with patient? Yes  SHORT TERM GOALS: (target date for Short term goals 05/12/2023)   1.  Patient will demonstrate independent use of home exercise program to maintain progress from in clinic treatments.  Goal status: MET 04/25/23  2. Patient ambulates >250' & negotiates ramps / curbs safely with crutches & CAM boot WBAT RLE  Goal status: ongoing 04/19/2023  LONG TERM GOALS: (target dates for all long term goals  07/12/2023 )   1. Patient will demonstrate/report pain at worst less than or equal to 2/10 to facilitate minimal limitation in daily activity secondary to pain symptoms.  Goal status: ongoing 04/19/2023   2. Patient will demonstrate independent use of home exercise program to facilitate ability to maintain/progress functional gains from skilled physical therapy services.  Goal status: ongoing 04/19/2023   3. Patient will demonstrate FOTO outcome > or = 49 % to indicate reduced disability due to condition.  Goal status: ongoing 04/19/2023   4.  Patient will  demonstrate right ankle LE MMT >3/5 throughout to faciltiate usual transfers, stairs, squatting at [facility] for daily life.   Goal status: ongoing 04/19/2023   5.  Patient ambulates without assistive device >500' and negotiates ramps, curbs and stairs single rail independently.  Goal status: ongoing 04/19/2023   6.  right ankle PROM dorsiflexion to neutral for standing activities.  Goal status: ongoing 04/19/2023   7.  patient demonstrates & verbalizes ability to perform work related tasks.  Goal Status: ongoing 04/19/2023  PLAN:  PT FREQUENCY:  1-2x/week (reduce frequency after initial 4 weeks due to high co-pay & visit limit)  PT DURATION: 14 weeks  PLANNED INTERVENTIONS: Therapeutic exercises, Therapeutic activity, Neuro Muscular re-education, Balance training, Gait training, Patient/Family education, Joint mobilization, Stair training, DME instructions, Dry Needling, Electrical stimulation, Traction, Cryotherapy, vasopneumatic deviceMoist heat, Taping, Ultrasound, Ionotophoresis 4mg /ml Dexamethasone, and aquatic therapy, Manual therapy.  All included unless contraindicated  PLAN FOR NEXT SESSION:   Continue with ankle AROM, try quadruped exercises, gentle ROM manually, vaso for edema management.   Next MD appointment : 05/03/23   Narda Amber, PT, MPT 04/25/23 2:39 PM   04/25/2023, 2:39 PM

## 2023-05-01 ENCOUNTER — Encounter: Payer: Self-pay | Admitting: Physical Therapy

## 2023-05-01 ENCOUNTER — Ambulatory Visit: Payer: BC Managed Care – PPO | Admitting: Physical Therapy

## 2023-05-01 DIAGNOSIS — M25671 Stiffness of right ankle, not elsewhere classified: Secondary | ICD-10-CM

## 2023-05-01 DIAGNOSIS — M25571 Pain in right ankle and joints of right foot: Secondary | ICD-10-CM | POA: Diagnosis not present

## 2023-05-01 DIAGNOSIS — R600 Localized edema: Secondary | ICD-10-CM

## 2023-05-01 DIAGNOSIS — M6281 Muscle weakness (generalized): Secondary | ICD-10-CM | POA: Diagnosis not present

## 2023-05-01 DIAGNOSIS — R2681 Unsteadiness on feet: Secondary | ICD-10-CM

## 2023-05-01 DIAGNOSIS — R2689 Other abnormalities of gait and mobility: Secondary | ICD-10-CM

## 2023-05-01 NOTE — Therapy (Signed)
OUTPATIENT PHYSICAL THERAPY LOWER EXTREMITY TREATMENT & PROGRESS NOTE   Patient Name: Joel Bailey MRN: [id number] DOB:07-Apr-1956, 67 y.o., male Today's Date: 05/01/2023  END OF SESSION:  PT End of Session - 05/01/23 1527     Visit Number 5    Number of Visits 16    Date for PT Re-Evaluation 07/12/23    Authorization Type [employer] comm PPO    Authorization - Visit Number 5    Authorization - Number of Visits 24    Progress Note Due on Visit 15    PT Start Time 1523    PT Stop Time 1611    PT Time Calculation (min) 48 min    Activity Tolerance Patient tolerated treatment well    Behavior During Therapy WFL for tasks assessed/performed               Past Medical History:  Diagnosis Date   Arthritis    Past Surgical History:  Procedure Laterality Date   ANTERIOR CERVICAL DECOMPRESSION/DISCECTOMY FUSION 4 LEVELS N/A 08/07/2017   Procedure: ANTERIOR CERVICAL DECOMPRESSION/DISCECTOMY FUSION CERVICAL 3- CERVICAL 4, CERVICAL 4 - CERVICAL 5, CERVICAL 5- CERVICAL 6, CERVICAL 6- CERVICAL 7;  Surgeon: Shirlean Kelly, MD;  Location: [facility] OR;  Service: Neurosurgery;  Laterality: N/A;   BACK SURGERY  2017   FOOT ARTHRODESIS Right 12/23/2022   Procedure: RIGHT TIBIOCALCANEAL FUSION;  Surgeon: Nadara Mustard, MD;  Location: [facility] OR;  Service: Orthopedics;  Laterality: Right;   Patient Active Problem List   Diagnosis Date Noted   Unstable ankle, right 12/23/2022   Cavovarus deformity of foot, acquired, right 12/23/2022   Lumbar stenosis with neurogenic claudication 12/07/2017   HNP (herniated nucleus pulposus) with myelopathy, cervical 08/07/2017    PCP: No PCP  REFERRING PROVIDER: Aldean Baker, MD  REFERRING DIAG: Z98.1 (ICD-10-CM) - S/P ankle fusion   THERAPY DIAG:  Stiffness of right ankle, not elsewhere classified  Muscle weakness (generalized)  Localized edema  Pain in right ankle and joints of right foot  Other abnormalities of gait and mobility  Unsteadiness on  feet  Rationale for Evaluation and Treatment: Rehabilitation  ONSET DATE: 03/28/2023 MD visit with PT referral / change in WB status  SUBJECTIVE:   SUBJECTIVE STATEMENT: His work has paperwork now.  He still has a job.  He has been doing his exercises.  PERTINENT HISTORY: Arthritis, cervical fusion sg, lumbar fusion sg  PAIN:  NPRS scale: today sitting  6/10 & walking 9/10 and over the last week lowest 3/10 & highest 9/10 Pain location: right ankle more in joint Pain description: sitting aches & walking sharp Aggravating factors: walking & standing Relieving factors: sit to rest & elevation  PRECAUTIONS: Fall  WEIGHT BEARING RESTRICTIONS: Yes RLE WBAT with CAM boot  FALLS:  Has patient fallen in last 6 months? No  LIVING ENVIRONMENT: Lives with: lives with their partner and dog 4# Lives in: Mobile home Stairs: Yes: External: 1 steps; none threshold into home from deck.  Ramp up to deck.  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 Lifting up to 50-60#, push / pull.   PLOF: Independent  PATIENT GOALS:  get back to work, walk in community, yard work   Next MD visit: 5/22/20924  OBJECTIVE:  DIAGNOSTIC FINDINGS: 03/28/2023 Three-view radiographs of the right ankle shows backing out of the hardware from the tibial calcaneal fusion.  With  persistent lucency of the tibial talar joint.   PATIENT SURVEYS:  Eval / 04/11/2023:   FOTO intake:  13%  predicted:  49%  COGNITION: Overall cognitive status: WFL    SENSATION: Eval / 04/11/2023:    Light touch: Impaired  Proprioception: Impaired   EDEMA:  05/01/2023: LLE: above ankle 21.5 cm,  around ankle 32 cm, below ankle 26.5 cm RLE: above ankle 23.5cm,  around ankle 34.5 cm, below ankle 28.5 cm  Eval / 04/11/2023:    RLE: above ankle 25.4cm,  around ankle 36.8 cm, below ankle 28.5 cm LLE: above ankle  21.8cm,  around ankle 32 cm, below ankle 27 cm   POSTURE: Eval / 04/11/2023:    rounded shoulders, forward head, flexed trunk , and weight shift left  PALPATION: Eval / 04/11/2023:   No open areas, tenderness along ankle joint line, tenderness Achille's Tendon.  LOWER EXTREMITY ROM:   ROM P:passive A:active Right Eval 04/11/23 Right 05/01/23  Hip flexion    Hip extension    Hip abduction    Hip adduction    Hip internal rotation    Hip external rotation    Knee flexion    Knee extension    Ankle dorsiflexion Seated P: -15* A: -18* Seated P: -5* A: -7*  Ankle plantarflexion Seated  P: 20* Seated  P: 28* A:  24*  Ankle inversion    Ankle eversion     (Blank rows = not tested)  LOWER EXTREMITY MMT:  MMT Right Eval 04/11/23  Hip flexion   Hip extension   Hip abduction   Hip adduction   Hip internal rotation   Hip external rotation   Knee flexion   Knee extension   Ankle dorsiflexion 2-/5  Ankle plantarflexion 2-/5  Ankle inversion 2-/5  Ankle eversion 2-/5   (Blank rows = not tested)  GAIT: 04/19/2023: pt amb >200' with CAM boot & crutches WBAT safely.   Eval / 04/11/2023:   Distance walked: 100' Assistive device utilized: Crutches and CAM boot Level of assistance: SBA Comments: WBAT with antalgic pattern significant decrease RLE stance duration.    TODAY'S TREATMENT                                                                          DATE: 05/01/2023: TherEx:  Seated heel slides with towel under Rt foot x 20  BAPS board seated level 1 AAROM CW & CCW 10 reps ea Attempting towel scrunches: x 1 minute Inversion/eversion; c towel under Rt foot x 20 each direction (windshield wiper motion) Seated heel raises 20 reps 4 way ankle exercises: Level 2 band: x 20 each  Quadraped UE raise & LE raise for all 4 extremities 10 reps; contralateral UE/LE lift 5 reps ea.   Pt continues gait with CAM boot with crutches PWB safely.   Modalities:  Vasopneumatic: dual  hose ankle & knee for whole leg edema 34 degrees, high compression, x 10 minutes, Rt LE elevated with bolster and pillow   04/25/23:  TherEx:  Seated heel slides with towel under Rt foot x 20  Seated rocker board x 2 minutes both direction Attempting towel scrunches: x 1 minute Inversion/eversion; c towel under Rt foot x 20 each  direction (windshield wiper motion) Seated heel slides: Rt heel staying on the floor x 15 4 way ankle exercises: Level 2 band: x 20 each  Modalities:  Vasopneumatic: 34 degrees, medium compression, x 10 minutes, Rt LE elevated with bolster and pillow      04/19/2023: Therapeutic exercise: Seated inversion / eversion with forefoot on towel 20 reps. Seated towel heel slides with active DF & functional gentle closed chain DF 20 reps. Seated ankle PF with knee flexed to 90* 20 reps Picking up 10 marbles moving to cup anterior for DF, to cup inward for inversion & to cup outward for eversion.  RLE Straight leg raise supine, right sidelying adduction, prone hip extension & left sidelying abduction 10 reps ea. With CAM boot for weight.  PT updated HEP with demo, verbal & HO. Pt verbalized understanding.   Manual therapy:  PROM / AAROM right anke DF & PF, great toe flex/ext and 4 lesser toe flex /ext  Self-care: PT demo & verbal cues on donning compression sock. Pt verbalized understanding.   Vaso for edema dual hose right ankle & knee 34* medium compression 10 min with elevation.     HOME EXERCISE PROGRAM: Access Code: [facility] URL: [email or web address] Date: 04/19/2023 Prepared by: Vladimir Faster  Exercises - Assisted ankle dorsiflexion with strap or towel  - 2-3 x daily - 7 x weekly - 1 sets - 3 reps - 30 seconds hold - Long Sitting Soleus Stretch on Bolster with Strap  - 2-3 x daily - 7 x weekly - 1 sets - 3 reps - 30 seconds hold - Ankle Alphabet in Elevation  - 2-4 x daily - 7 x weekly - 1 sets - 1 reps - 15 minutes elevation hold -  Seated Ankle Pumps  - 2-3 x daily - 7 x weekly - 3 sets - 10 reps - 5 seconds hold - Seated Toe Curl  - 2-3 x daily - 7 x weekly - 3 sets - 10 reps - 5 seconds hold - Supine Straight Leg Raises  - 1 x daily - 7 x weekly - 1 sets - 10 reps - 5 seconds hold - Sidelying Hip Abduction  - 1 x daily - 7 x weekly - 1 sets - 10 reps - 5 seconds hold - Prone Hip Extension  - 1 x daily - 7 x weekly - 1 sets - 10 reps - 5 seconds hold - Sidelying Hip Adduction (No weight)  - 1 x daily - 7 x weekly - 1 sets - 10 reps - 5 seconds hold - Seated Heel Raise  - 1 x daily - 7 x weekly - 1 sets - 10 reps - 5 seconds hold - Seated Heel Slide  - 1 x daily - 7 x weekly - 1 sets - 10 reps - 5 seconds hold - Ankle Inversion Eversion Towel Slide  - 1 x daily - 7 x weekly - 1 sets - 10 reps - 5 seconds hold - Seated Marble Transfer with Toes  - 1 x daily - 7 x weekly - 3 sets - 10 reps  ASSESSMENT: CLINICAL IMPRESSION: Patient's AROM & PROM have improved.  He is functioning with CAM boot & crutches safely.  He would benefit from additional PT. If able to weight bear on RLE, then PT will progress activities.   OBJECTIVE IMPAIRMENTS: Abnormal gait, decreased activity tolerance, decreased balance, decreased endurance, decreased knowledge of condition, decreased knowledge of use of DME, decreased mobility, difficulty walking, decreased ROM, decreased  strength, increased edema, impaired flexibility, postural dysfunction, and pain.   ACTIVITY LIMITATIONS: carrying, lifting, bending, standing, squatting, sleeping, stairs, transfers, and locomotion level  PARTICIPATION LIMITATIONS: meal prep, cleaning, laundry, driving, shopping, community activity, occupation, and yard work  PERSONAL FACTORS: Education, Fitness, Time since onset of injury/illness/exacerbation, and 1-2 comorbidities: see PMH  are also affecting patient's functional outcome.   REHAB POTENTIAL: Good  CLINICAL DECISION MAKING: Stable/uncomplicated  EVALUATION  COMPLEXITY: Low   GOALS: Goals reviewed with patient? Yes  SHORT TERM GOALS: (target date for Short term goals 05/12/2023)   1.  Patient will demonstrate independent use of home exercise program to maintain progress from in clinic treatments.  Goal status: MET 04/25/23  2. Patient ambulates >250' & negotiates ramps / curbs safely with crutches & CAM boot WBAT RLE  Goal status: ongoing 04/19/2023  LONG TERM GOALS: (target dates for all long term goals  07/12/2023 )   1. Patient will demonstrate/report pain at worst less than or equal to 2/10 to facilitate minimal limitation in daily activity secondary to pain symptoms.  Goal status: ongoing 04/19/2023   2. Patient will demonstrate independent use of home exercise program to facilitate ability to maintain/progress functional gains from skilled physical therapy services.  Goal status: ongoing 04/19/2023   3. Patient will demonstrate FOTO outcome > or = 49 % to indicate reduced disability due to condition.  Goal status: ongoing 04/19/2023   4.  Patient will demonstrate right ankle LE MMT >3/5 throughout to faciltiate usual transfers, stairs, squatting at [facility] for daily life.   Goal status: ongoing 04/19/2023   5.  Patient ambulates without assistive device >500' and negotiates ramps, curbs and stairs single rail independently.  Goal status: ongoing 04/19/2023   6.  right ankle PROM dorsiflexion to neutral for standing activities.  Goal status: ongoing 04/19/2023   7.  patient demonstrates & verbalizes ability to perform work related tasks.  Goal Status: ongoing 04/19/2023  PLAN:  PT FREQUENCY:  1-2x/week (reduce frequency after initial 4 weeks due to high co-pay & visit limit)  PT DURATION: 14 weeks  PLANNED INTERVENTIONS: Therapeutic exercises, Therapeutic activity, Neuro Muscular re-education, Balance training, Gait training, Patient/Family education, Joint mobilization, Stair training, DME instructions, Dry Needling, Electrical  stimulation, Traction, Cryotherapy, vasopneumatic deviceMoist heat, Taping, Ultrasound, Ionotophoresis 4mg /ml Dexamethasone, and aquatic therapy, Manual therapy.  All included unless contraindicated  PLAN FOR NEXT SESSION: check NP note if allowed to progress weight bearing.   Continue with ankle AROM, try quadruped exercises, gentle ROM manually, vaso for edema management.     Vladimir Faster, PT, DPT 05/01/2023, 4:14 PM

## 2023-05-03 ENCOUNTER — Ambulatory Visit (INDEPENDENT_AMBULATORY_CARE_PROVIDER_SITE_OTHER): Payer: BC Managed Care – PPO

## 2023-05-03 ENCOUNTER — Encounter: Payer: Self-pay | Admitting: Family

## 2023-05-03 ENCOUNTER — Encounter: Payer: Self-pay | Admitting: Physical Therapy

## 2023-05-03 ENCOUNTER — Ambulatory Visit (INDEPENDENT_AMBULATORY_CARE_PROVIDER_SITE_OTHER): Payer: BC Managed Care – PPO | Admitting: Family

## 2023-05-03 ENCOUNTER — Ambulatory Visit (INDEPENDENT_AMBULATORY_CARE_PROVIDER_SITE_OTHER): Payer: BC Managed Care – PPO | Admitting: Physical Therapy

## 2023-05-03 DIAGNOSIS — R2681 Unsteadiness on feet: Secondary | ICD-10-CM

## 2023-05-03 DIAGNOSIS — R600 Localized edema: Secondary | ICD-10-CM

## 2023-05-03 DIAGNOSIS — Z981 Arthrodesis status: Secondary | ICD-10-CM | POA: Diagnosis not present

## 2023-05-03 DIAGNOSIS — M25571 Pain in right ankle and joints of right foot: Secondary | ICD-10-CM

## 2023-05-03 DIAGNOSIS — R2689 Other abnormalities of gait and mobility: Secondary | ICD-10-CM

## 2023-05-03 DIAGNOSIS — M25671 Stiffness of right ankle, not elsewhere classified: Secondary | ICD-10-CM

## 2023-05-03 DIAGNOSIS — M6281 Muscle weakness (generalized): Secondary | ICD-10-CM

## 2023-05-03 NOTE — Progress Notes (Signed)
   Post-Op Visit Note   Patient: Joel Bailey           Date of Birth: 12-28-1955           MRN: [id number] Visit Date: 05/03/2023 PCP: Pcp, No  Chief Complaint: No chief complaint on file.   HPI:  HPI The patient is a 67 year old gentleman seen status post right tibial calcaneal fusion. The has been attempting to increase his weight bearing in the CAM walker. Has begun PT.  Ortho Exam On examination of the right ankle there is minimal edema no erythema or warmth. Incision is well healed. No palpable hardware.  Visit Diagnoses:  1. S/P ankle fusion     Plan: he will continue out of work. Continue PT.  3 view radiographs of right ankle at follow up. Will discuss with Dr. Lajoyce Corners.  Follow-Up Instructions: No follow-ups on file.   Imaging: No results found.  Orders:  Orders Placed This Encounter  Procedures   XR Ankle Complete Left   No orders of the defined types were placed in this encounter.    PMFS History: Patient Active Problem List   Diagnosis Date Noted   Unstable ankle, right 12/23/2022   Cavovarus deformity of foot, acquired, right 12/23/2022   Lumbar stenosis with neurogenic claudication 12/07/2017   HNP (herniated nucleus pulposus) with myelopathy, cervical 08/07/2017   Past Medical History:  Diagnosis Date   Arthritis     No family history on file.  Past Surgical History:  Procedure Laterality Date   ANTERIOR CERVICAL DECOMPRESSION/DISCECTOMY FUSION 4 LEVELS N/A 08/07/2017   Procedure: ANTERIOR CERVICAL DECOMPRESSION/DISCECTOMY FUSION CERVICAL 3- CERVICAL 4, CERVICAL 4 - CERVICAL 5, CERVICAL 5- CERVICAL 6, CERVICAL 6- CERVICAL 7;  Surgeon: Shirlean Kelly, MD;  Location: [facility] OR;  Service: Neurosurgery;  Laterality: N/A;   BACK SURGERY  2017   FOOT ARTHRODESIS Right 12/23/2022   Procedure: RIGHT TIBIOCALCANEAL FUSION;  Surgeon: Nadara Mustard, MD;  Location: [facility] OR;  Service: Orthopedics;  Laterality: Right;   Social History   Occupational History    Not on file  Tobacco Use   Smoking status: Every Day    Packs/day: 1.00    Years: 52.00    Additional pack years: 0.00    Total pack years: 52.00    Types: Cigarettes   Smokeless tobacco: Never  Vaping Use   Vaping Use: Never used  Substance and Sexual Activity   Alcohol use: No   Drug use: No   Sexual activity: Not on file

## 2023-05-03 NOTE — Therapy (Signed)
OUTPATIENT PHYSICAL THERAPY LOWER EXTREMITY TREATMENT   Patient Name: Joel Bailey MRN: [id number] DOB:11-Nov-1956, 67 y.o., male Today's Date: 05/03/2023  END OF SESSION:  PT End of Session - 05/03/23 1519     Visit Number 5    Number of Visits 16    Date for PT Re-Evaluation 07/12/23    Authorization Type [employer] comm PPO    Authorization - Visit Number 6    Authorization - Number of Visits 24    Progress Note Due on Visit 15    PT Start Time 1520    PT Stop Time 1535    PT Time Calculation (min) 15 min    Activity Tolerance --    Behavior During Therapy --               Past Medical History:  Diagnosis Date   Arthritis    Past Surgical History:  Procedure Laterality Date   ANTERIOR CERVICAL DECOMPRESSION/DISCECTOMY FUSION 4 LEVELS N/A 08/07/2017   Procedure: ANTERIOR CERVICAL DECOMPRESSION/DISCECTOMY FUSION CERVICAL 3- CERVICAL 4, CERVICAL 4 - CERVICAL 5, CERVICAL 5- CERVICAL 6, CERVICAL 6- CERVICAL 7;  Surgeon: Joel Kelly, MD;  Location: [facility] OR;  Service: Neurosurgery;  Laterality: N/A;   BACK SURGERY  2017   FOOT ARTHRODESIS Right 12/23/2022   Procedure: RIGHT TIBIOCALCANEAL FUSION;  Surgeon: Joel Mustard, MD;  Location: [facility] OR;  Service: Orthopedics;  Laterality: Right;   Patient Active Problem List   Diagnosis Date Noted   Unstable ankle, right 12/23/2022   Cavovarus deformity of foot, acquired, right 12/23/2022   Lumbar stenosis with neurogenic claudication 12/07/2017   HNP (herniated nucleus pulposus) with myelopathy, cervical 08/07/2017    PCP: No PCP  REFERRING PROVIDER: Aldean Baker, MD  REFERRING DIAG: Z98.1 (ICD-10-CM) - S/P ankle fusion   THERAPY DIAG:  Stiffness of right ankle, not elsewhere classified  Muscle weakness (generalized)  Localized edema  Pain in right ankle and joints of right foot  Other abnormalities of gait and mobility  Unsteadiness on feet  Rationale for Evaluation and Treatment: Rehabilitation  ONSET DATE:  03/28/2023 MD visit with PT referral / change in WB status  SUBJECTIVE:   SUBJECTIVE STATEMENT: He saw Joel Del, NP prior to PT today and his ankle is not healing.   PERTINENT HISTORY: Arthritis, cervical fusion sg, lumbar fusion sg  PAIN:  NPRS scale: today sitting  6/10 & walking 9/10 and over the last week lowest 3/10 & highest 9/10 Pain location: right ankle more in joint Pain description: sitting aches & walking sharp Aggravating factors: walking & standing Relieving factors: sit to rest & elevation  PRECAUTIONS: Fall  WEIGHT BEARING RESTRICTIONS: Yes RLE WBAT with CAM boot  FALLS:  Has patient fallen in last 6 months? No  LIVING ENVIRONMENT: Lives with: lives with their partner and dog 4# Lives in: Mobile home Stairs: Yes: External: 1 steps; none threshold into home from deck.  Ramp up to deck.  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 Lifting up to 50-60#, push / pull.   PLOF: Independent  PATIENT GOALS:  get back to work, walk in community, yard work   Next MD visit: 5/22/20924  OBJECTIVE:  DIAGNOSTIC FINDINGS: 03/28/2023 Three-view radiographs of the right ankle shows backing out of the hardware from the tibial calcaneal fusion.  With persistent lucency of the tibial talar joint.   PATIENT SURVEYS:  Eval / 04/11/2023:   FOTO intake:  13%  predicted:  49%  COGNITION: Overall cognitive status: WFL    SENSATION: Eval / 04/11/2023:    Light touch: Impaired  Proprioception: Impaired   EDEMA:  05/01/2023: LLE: above ankle 21.5 cm,  around ankle 32 cm, below ankle 26.5 cm RLE: above ankle 23.5cm,  around ankle 34.5 cm, below ankle 28.5 cm  Eval / 04/11/2023:    RLE: above ankle 25.4cm,  around ankle 36.8 cm, below ankle 28.5 cm LLE: above ankle 21.8cm,  around ankle 32 cm, below ankle 27 cm   POSTURE: Eval / 04/11/2023:    rounded  shoulders, forward head, flexed trunk , and weight shift left  PALPATION: Eval / 04/11/2023:   No open areas, tenderness along ankle joint line, tenderness Achille's Tendon.  LOWER EXTREMITY ROM:   ROM P:passive A:active Right Eval 04/11/23 Right 05/01/23  Hip flexion    Hip extension    Hip abduction    Hip adduction    Hip internal rotation    Hip external rotation    Knee flexion    Knee extension    Ankle dorsiflexion Seated P: -15* A: -18* Seated P: -5* A: -7*  Ankle plantarflexion Seated  P: 20* Seated  P: 28* A:  24*  Ankle inversion    Ankle eversion     (Blank rows = not tested)  LOWER EXTREMITY MMT:  MMT Right Eval 04/11/23  Hip flexion   Hip extension   Hip abduction   Hip adduction   Hip internal rotation   Hip external rotation   Knee flexion   Knee extension   Ankle dorsiflexion 2-/5  Ankle plantarflexion 2-/5  Ankle inversion 2-/5  Ankle eversion 2-/5   (Blank rows = not tested)  GAIT: 04/19/2023: pt amb >200' with CAM boot & crutches WBAT safely.   Eval / 04/11/2023:   Distance walked: 100' Assistive device utilized: Crutches and CAM boot Level of assistance: SBA Comments: WBAT with antalgic pattern significant decrease RLE stance duration.    TODAY'S TREATMENT                                                                          DATE: 05/03/2023: PT went downstairs and spoke with Joel Baker, MD.  Upon reviewing his X-rays, Dr. Lajoyce Bailey agrees fracture is not healing; he is not concerned with screw that is backing out as ant/post alignment is okay.   Due to limited PT visits and pt is independent with HEP & gait to this point in his rehab. PT, Dr. Lajoyce Bailey & patient agreed to hold PT until after next MD appointment. Pt in agreement and understands need to continue with HEP.    05/01/2023: TherEx:  Seated heel slides with towel under Rt foot x 20  BAPS board seated level 1 AAROM CW & CCW 10 reps ea Attempting towel scrunches: x 1  minute Inversion/eversion; c towel under Rt foot x 20 each direction (windshield wiper motion) Seated heel raises 20 reps 4 way ankle exercises: Level 2 band: x 20 each  Quadraped UE raise & LE raise for all 4 extremities 10 reps; contralateral UE/LE lift 5 reps ea.   Pt continues gait with CAM  boot with crutches PWB safely.   Modalities:  Vasopneumatic: dual hose ankle & knee for whole leg edema 34 degrees, high compression, x 10 minutes, Rt LE elevated with bolster and pillow   04/25/23:  TherEx:  Seated heel slides with towel under Rt foot x 20  Seated rocker board x 2 minutes both direction Attempting towel scrunches: x 1 minute Inversion/eversion; c towel under Rt foot x 20 each direction (windshield wiper motion) Seated heel slides: Rt heel staying on the floor x 15 4 way ankle exercises: Level 2 band: x 20 each  Modalities:  Vasopneumatic: 34 degrees, medium compression, x 10 minutes, Rt LE elevated with bolster and pillow      04/19/2023: Therapeutic exercise: Seated inversion / eversion with forefoot on towel 20 reps. Seated towel heel slides with active DF & functional gentle closed chain DF 20 reps. Seated ankle PF with knee flexed to 90* 20 reps Picking up 10 marbles moving to cup anterior for DF, to cup inward for inversion & to cup outward for eversion.  RLE Straight leg raise supine, right sidelying adduction, prone hip extension & left sidelying abduction 10 reps ea. With CAM boot for weight.  PT updated HEP with demo, verbal & HO. Pt verbalized understanding.   Manual therapy:  PROM / AAROM right anke DF & PF, great toe flex/ext and 4 lesser toe flex /ext  Self-care: PT demo & verbal cues on donning compression sock. Pt verbalized understanding.   Vaso for edema dual hose right ankle & knee 34* medium compression 10 min with elevation.     HOME EXERCISE PROGRAM: Access Code: [facility] URL: [email or web address] Date: 04/19/2023 Prepared by:  Vladimir Faster  Exercises - Assisted ankle dorsiflexion with strap or towel  - 2-3 x daily - 7 x weekly - 1 sets - 3 reps - 30 seconds hold - Long Sitting Soleus Stretch on Bolster with Strap  - 2-3 x daily - 7 x weekly - 1 sets - 3 reps - 30 seconds hold - Ankle Alphabet in Elevation  - 2-4 x daily - 7 x weekly - 1 sets - 1 reps - 15 minutes elevation hold - Seated Ankle Pumps  - 2-3 x daily - 7 x weekly - 3 sets - 10 reps - 5 seconds hold - Seated Toe Curl  - 2-3 x daily - 7 x weekly - 3 sets - 10 reps - 5 seconds hold - Supine Straight Leg Raises  - 1 x daily - 7 x weekly - 1 sets - 10 reps - 5 seconds hold - Sidelying Hip Abduction  - 1 x daily - 7 x weekly - 1 sets - 10 reps - 5 seconds hold - Prone Hip Extension  - 1 x daily - 7 x weekly - 1 sets - 10 reps - 5 seconds hold - Sidelying Hip Adduction (No weight)  - 1 x daily - 7 x weekly - 1 sets - 10 reps - 5 seconds hold - Seated Heel Raise  - 1 x daily - 7 x weekly - 1 sets - 10 reps - 5 seconds hold - Seated Heel Slide  - 1 x daily - 7 x weekly - 1 sets - 10 reps - 5 seconds hold - Ankle Inversion Eversion Towel Slide  - 1 x daily - 7 x weekly - 1 sets - 10 reps - 5 seconds hold - Seated Marble Transfer with Toes  - 1 x daily - 7 x weekly - 3  sets - 10 reps  ASSESSMENT: CLINICAL IMPRESSION: Due to lack of healing for fracture and patient is independent with current level of exercises, it is in his best interest to hold PT until after next MD appt. When we are able to progress his weight bearing & activity level, then he will benefit from further PT.   OBJECTIVE IMPAIRMENTS: Abnormal gait, decreased activity tolerance, decreased balance, decreased endurance, decreased knowledge of condition, decreased knowledge of use of DME, decreased mobility, difficulty walking, decreased ROM, decreased strength, increased edema, impaired flexibility, postural dysfunction, and pain.   ACTIVITY LIMITATIONS: carrying, lifting, bending, standing,  squatting, sleeping, stairs, transfers, and locomotion level  PARTICIPATION LIMITATIONS: meal prep, cleaning, laundry, driving, shopping, community activity, occupation, and yard work  PERSONAL FACTORS: Education, Fitness, Time since onset of injury/illness/exacerbation, and 1-2 comorbidities: see PMH  are also affecting patient's functional outcome.   REHAB POTENTIAL: Good  CLINICAL DECISION MAKING: Stable/uncomplicated  EVALUATION COMPLEXITY: Low   GOALS: Goals reviewed with patient? Yes  SHORT TERM GOALS: (target date for Short term goals 05/12/2023)   1.  Patient will demonstrate independent use of home exercise program to maintain progress from in clinic treatments.  Goal status: MET 04/25/23  2. Patient ambulates >250' & negotiates ramps / curbs safely with crutches & CAM boot WBAT RLE  Goal status: ongoing 04/19/2023  LONG TERM GOALS: (target dates for all long term goals  07/12/2023 )   1. Patient will demonstrate/report pain at worst less than or equal to 2/10 to facilitate minimal limitation in daily activity secondary to pain symptoms.  Goal status: ongoing 04/19/2023   2. Patient will demonstrate independent use of home exercise program to facilitate ability to maintain/progress functional gains from skilled physical therapy services.  Goal status: ongoing 04/19/2023   3. Patient will demonstrate FOTO outcome > or = 49 % to indicate reduced disability due to condition.  Goal status: ongoing 04/19/2023   4.  Patient will demonstrate right ankle LE MMT >3/5 throughout to faciltiate usual transfers, stairs, squatting at [facility] for daily life.   Goal status: ongoing 04/19/2023   5.  Patient ambulates without assistive device >500' and negotiates ramps, curbs and stairs single rail independently.  Goal status: ongoing 04/19/2023   6.  right ankle PROM dorsiflexion to neutral for standing activities.  Goal status: ongoing 04/19/2023   7.  patient demonstrates & verbalizes ability  to perform work related tasks.  Goal Status: ongoing 04/19/2023  PLAN:  PT FREQUENCY:  1-2x/week (reduce frequency after initial 4 weeks due to high co-pay & visit limit)  PT DURATION: 14 weeks  PLANNED INTERVENTIONS: Therapeutic exercises, Therapeutic activity, Neuro Muscular re-education, Balance training, Gait training, Patient/Family education, Joint mobilization, Stair training, DME instructions, Dry Needling, Electrical stimulation, Traction, Cryotherapy, vasopneumatic deviceMoist heat, Taping, Ultrasound, Ionotophoresis 4mg /ml Dexamethasone, and aquatic therapy, Manual therapy.  All included unless contraindicated  PLAN FOR NEXT SESSION: Hold PT until after next MD appointment. Check MD note, reassess and progress as indicated.     Vladimir Faster, PT, DPT 05/03/2023, 3:50 PM

## 2023-05-10 ENCOUNTER — Encounter: Payer: BC Managed Care – PPO | Admitting: Physical Therapy

## 2023-05-11 ENCOUNTER — Telehealth: Payer: Self-pay | Admitting: Family

## 2023-05-11 NOTE — Telephone Encounter (Signed)
Received call from patient. Needs 05/03/23 ov note faxed to [facility]. I faxed [phone]

## 2023-05-15 ENCOUNTER — Encounter: Payer: BC Managed Care – PPO | Admitting: Physical Therapy

## 2023-05-16 ENCOUNTER — Telehealth: Payer: Self-pay | Admitting: Orthopedic Surgery

## 2023-05-16 NOTE — Telephone Encounter (Signed)
[employer] forms received. To [employer]. 

## 2023-05-17 ENCOUNTER — Encounter: Payer: BC Managed Care – PPO | Admitting: Physical Therapy

## 2023-05-22 ENCOUNTER — Encounter: Payer: BC Managed Care – PPO | Admitting: Physical Therapy

## 2023-05-24 ENCOUNTER — Encounter: Payer: BC Managed Care – PPO | Admitting: Physical Therapy

## 2023-06-07 ENCOUNTER — Ambulatory Visit: Payer: BC Managed Care – PPO | Admitting: Family

## 2023-06-07 ENCOUNTER — Encounter: Payer: BC Managed Care – PPO | Admitting: Physical Therapy

## 2023-06-08 ENCOUNTER — Encounter: Payer: Self-pay | Admitting: Orthopedic Surgery

## 2023-06-08 ENCOUNTER — Ambulatory Visit: Payer: BC Managed Care – PPO | Admitting: Orthopedic Surgery

## 2023-06-08 ENCOUNTER — Other Ambulatory Visit (INDEPENDENT_AMBULATORY_CARE_PROVIDER_SITE_OTHER): Payer: BC Managed Care – PPO

## 2023-06-08 DIAGNOSIS — Z981 Arthrodesis status: Secondary | ICD-10-CM

## 2023-06-08 NOTE — Progress Notes (Signed)
Office Visit Note   Patient: Joel Bailey           Date of Birth: 03/23/1956           MRN: [id number] Visit Date: 06/08/2023              Requested by: No referring provider defined for this encounter. PCP: Pcp, No  Chief Complaint  Patient presents with   Right Ankle - Follow-up    12/23/2022 tib-cal fusion      HPI: Patient is a 67 year old gentleman who is 5 months status post right tibial calcaneal fusion with delayed healing.  He has been nonweightbearing in a fracture boot and crutches.  Assessment & Plan: Visit Diagnoses:  1. S/P ankle fusion     Plan: Will begin weightbearing as tolerated in the fracture boot wean off the crutches.  At follow-up in 4 weeks for repeat radiographs.  He will resume his physical therapy.  Follow-Up Instructions: Return in about 4 weeks (around 07/06/2023).   Ortho Exam  Patient is alert, oriented, no adenopathy, well-dressed, normal affect, normal respiratory effort. Examination patient does have increased swelling in the foot.  His pain that he has is primarily across the forefoot he has no pain in the hindfoot no pain where the fusion is.  Imaging: XR Ankle Complete Right  Result Date: 06/08/2023 Three-view radiographs of the right ankle shows interval bony healing.  There has been some loosening of the screws.  No images are attached to the encounter.  Labs: No results found for: "HGBA1C", "ESRSEDRATE", "CRP", "LABURIC", "REPTSTATUS", "GRAMSTAIN", "CULT", "LABORGA"   No results found for: "ALBUMIN", "PREALBUMIN", "CBC"  No results found for: "MG" No results found for: "VD25OH"  No results found for: "PREALBUMIN"    Latest Ref Rng & Units 12/23/2022    7:25 AM 12/01/2017    1:26 PM 08/02/2017    2:48 PM  CBC EXTENDED  WBC 4.0 - 10.5 K/uL 13.7  16.0  11.8   RBC 4.22 - 5.81 MIL/uL 4.48  4.51  3.99   Hemoglobin 13.0 - 17.0 g/dL 62.1  30.8  65.7   HCT 39.0 - 52.0 % 42.9  43.3  38.1   Platelets 150 - 400 K/uL 245  277   241      There is no height or weight on file to calculate BMI.  Orders:  Orders Placed This Encounter  Procedures   XR Ankle Complete Right   No orders of the defined types were placed in this encounter.    Procedures: No procedures performed  Clinical Data: No additional findings.  ROS:  All other systems negative, except as noted in the HPI. Review of Systems  Objective: Vital Signs: There were no vitals taken for this visit.  Specialty Comments:  No specialty comments available.  PMFS History: Patient Active Problem List   Diagnosis Date Noted   Unstable ankle, right 12/23/2022   Cavovarus deformity of foot, acquired, right 12/23/2022   Lumbar stenosis with neurogenic claudication 12/07/2017   HNP (herniated nucleus pulposus) with myelopathy, cervical 08/07/2017   Past Medical History:  Diagnosis Date   Arthritis     History reviewed. No pertinent family history.  Past Surgical History:  Procedure Laterality Date   ANTERIOR CERVICAL DECOMPRESSION/DISCECTOMY FUSION 4 LEVELS N/A 08/07/2017   Procedure: ANTERIOR CERVICAL DECOMPRESSION/DISCECTOMY FUSION CERVICAL 3- CERVICAL 4, CERVICAL 4 - CERVICAL 5, CERVICAL 5- CERVICAL 6, CERVICAL 6- CERVICAL 7;  Surgeon: Shirlean Kelly, MD;  Location: [facility] OR;  Service: Neurosurgery;  Laterality: N/A;   BACK SURGERY  2017   FOOT ARTHRODESIS Right 12/23/2022   Procedure: RIGHT TIBIOCALCANEAL FUSION;  Surgeon: Nadara Mustard, MD;  Location: [facility] OR;  Service: Orthopedics;  Laterality: Right;   Social History   Occupational History   Not on file  Tobacco Use   Smoking status: Every Day    Packs/day: 1.00    Years: 52.00    Additional pack years: 0.00    Total pack years: 52.00    Types: Cigarettes   Smokeless tobacco: Never  Vaping Use   Vaping Use: Never used  Substance and Sexual Activity   Alcohol use: No   Drug use: No   Sexual activity: Not on file

## 2023-06-09 ENCOUNTER — Telehealth: Payer: Self-pay | Admitting: Orthopedic Surgery

## 2023-06-09 NOTE — Telephone Encounter (Signed)
I spoke with patient regarding [employer] form and $25.00 he dropped off. I explained that I had updated a form on 6/20 with approx. Rtw date of 07/24/2023, and that I will return his check to him. He stated to mail the check to him/address on file. I did complete a new form and faxed to [employer] and mailed patient his check.

## 2023-06-12 ENCOUNTER — Ambulatory Visit: Payer: BC Managed Care – PPO | Admitting: Physical Therapy

## 2023-06-12 ENCOUNTER — Encounter: Payer: Self-pay | Admitting: Physical Therapy

## 2023-06-12 DIAGNOSIS — R2681 Unsteadiness on feet: Secondary | ICD-10-CM

## 2023-06-12 DIAGNOSIS — R600 Localized edema: Secondary | ICD-10-CM

## 2023-06-12 DIAGNOSIS — M25571 Pain in right ankle and joints of right foot: Secondary | ICD-10-CM

## 2023-06-12 DIAGNOSIS — M6281 Muscle weakness (generalized): Secondary | ICD-10-CM

## 2023-06-12 DIAGNOSIS — M25671 Stiffness of right ankle, not elsewhere classified: Secondary | ICD-10-CM

## 2023-06-12 DIAGNOSIS — R2689 Other abnormalities of gait and mobility: Secondary | ICD-10-CM

## 2023-06-12 NOTE — Therapy (Signed)
OUTPATIENT PHYSICAL THERAPY LOWER EXTREMITY TREATMENT   Patient Name: Joel Bailey MRN: [id number] DOB:1956/03/29, 67 y.o., male Today's Date: 06/12/2023  END OF SESSION:  PT End of Session - 06/12/23 1517     Visit Number 6    Number of Visits 16    Date for PT Re-Evaluation 07/12/23    Authorization Type [employer] comm PPO    Authorization - Visit Number 6    Authorization - Number of Visits 24    Progress Note Due on Visit 15    PT Start Time 1518    PT Stop Time 1556    PT Time Calculation (min) 38 min               Past Medical History:  Diagnosis Date   Arthritis    Past Surgical History:  Procedure Laterality Date   ANTERIOR CERVICAL DECOMPRESSION/DISCECTOMY FUSION 4 LEVELS N/A 08/07/2017   Procedure: ANTERIOR CERVICAL DECOMPRESSION/DISCECTOMY FUSION CERVICAL 3- CERVICAL 4, CERVICAL 4 - CERVICAL 5, CERVICAL 5- CERVICAL 6, CERVICAL 6- CERVICAL 7;  Surgeon: Shirlean Kelly, MD;  Location: [facility] OR;  Service: Neurosurgery;  Laterality: N/A;   BACK SURGERY  2017   FOOT ARTHRODESIS Right 12/23/2022   Procedure: RIGHT TIBIOCALCANEAL FUSION;  Surgeon: Nadara Mustard, MD;  Location: [facility] OR;  Service: Orthopedics;  Laterality: Right;   Patient Active Problem List   Diagnosis Date Noted   Unstable ankle, right 12/23/2022   Cavovarus deformity of foot, acquired, right 12/23/2022   Lumbar stenosis with neurogenic claudication 12/07/2017   HNP (herniated nucleus pulposus) with myelopathy, cervical 08/07/2017    PCP: No PCP  REFERRING PROVIDER: Aldean Baker, MD  REFERRING DIAG: Z98.1 (ICD-10-CM) - S/P ankle fusion   THERAPY DIAG:  Stiffness of right ankle, not elsewhere classified  Muscle weakness (generalized)  Localized edema  Pain in right ankle and joints of right foot  Other abnormalities of gait and mobility  Unsteadiness on feet  Rationale for Evaluation and Treatment: Rehabilitation  ONSET DATE: 03/28/2023 MD visit with PT referral / change in WB  status  SUBJECTIVE:   SUBJECTIVE STATEMENT: He saw Dr. Lajoyce Corners on 06/08/2023 with note reporting: Will begin weightbearing as tolerated in the fracture boot wean off the crutches     He has still been doing the exercises that PT gave him.  PERTINENT HISTORY: Arthritis, cervical fusion sg, lumbar fusion sg  PAIN:  NPRS scale: today sitting 3-4/10 & walking 7/10 and over the last week lowest 2/10 & highest 9/10 Pain location: right ankle more in joint Pain description: sitting aches & walking sharp Aggravating factors: walking & standing Relieving factors: sit to rest & elevation  PRECAUTIONS: Fall  WEIGHT BEARING RESTRICTIONS: Yes RLE WBAT with CAM boot  FALLS:  Has patient fallen in last 6 months? No  LIVING ENVIRONMENT: Lives with: lives with their partner and dog 4# Lives in: Mobile home Stairs: Yes: External: 1 steps; none threshold into home from deck.  Ramp up to deck.  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 Lifting up to 50-60#, push / pull.   PLOF: Independent  PATIENT GOALS:  get back to work, walk in community, yard work   Next MD visit: 5/22/20924  OBJECTIVE:  DIAGNOSTIC FINDINGS:  06/08/2023 Three-view radiographs of the right ankle shows interval bony healing.  There has been some loosening of the screws.   03/28/2023  Three-view radiographs of the right ankle shows backing out of the hardware from the tibial calcaneal fusion.  With persistent lucency of the tibial talar joint.   PATIENT SURVEYS:  Eval / 04/11/2023:   FOTO intake:  13%  predicted:  49%  COGNITION: Overall cognitive status: WFL    SENSATION: Eval / 04/11/2023:    Light touch: Impaired  Proprioception: Impaired   EDEMA:  05/01/2023: LLE: above ankle 21.5 cm,  around ankle 32 cm, below ankle 26.5 cm RLE: above ankle 23.5cm,  around ankle 34.5 cm, below ankle 28.5  cm  Eval / 04/11/2023:    RLE: above ankle 25.4cm,  around ankle 36.8 cm, below ankle 28.5 cm LLE: above ankle 21.8cm,  around ankle 32 cm, below ankle 27 cm   POSTURE: Eval / 04/11/2023:    rounded shoulders, forward head, flexed trunk , and weight shift left  PALPATION: Eval / 04/11/2023:   No open areas, tenderness along ankle joint line, tenderness Achille's Tendon.  LOWER EXTREMITY ROM:   ROM P:passive A:active Right Eval 04/11/23 Right 05/01/23 Right 06/12/23  Hip flexion     Hip extension     Hip abduction     Hip adduction     Hip internal rotation     Hip external rotation     Knee flexion     Knee extension     Ankle dorsiflexion Seated P: -15* A: -18* Seated P: -5* A: -7* Seated Knee ext A: -14* P:-11* Knee flex A: -9* P: -7*  Ankle plantarflexion Seated  P: 20* Seated  P: 28* A:  24*   Ankle inversion     Ankle eversion      (Blank rows = not tested)  LOWER EXTREMITY MMT:  MMT Right Eval 04/11/23  Hip flexion   Hip extension   Hip abduction   Hip adduction   Hip internal rotation   Hip external rotation   Knee flexion   Knee extension   Ankle dorsiflexion 2-/5  Ankle plantarflexion 2-/5  Ankle inversion 2-/5  Ankle eversion 2-/5   (Blank rows = not tested)  GAIT: 04/19/2023: pt amb >200' with CAM boot & crutches WBAT safely.   Eval / 04/11/2023:   Distance walked: 100' Assistive device utilized: Crutches and CAM boot Level of assistance: SBA Comments: WBAT with antalgic pattern significant decrease RLE stance duration.    TODAY'S TREATMENT                                                                          DATE: 06/12/2023: TherEx:  See objective data above Toe raises with heel on towel roll and heel raises with forefoot on towel roll 10 reps 2 sets ea.  Neuromuscular Re-education: Hip width stance with pelvic weight shift to midline: eyes open then eyes closed 5 reps ea head motions right/left, up/down & diagonals with CGA / SBA.    Gait Training: Pt arrived ambulating with crutches & CAM boot WBAT with 3 point pattern with minimal weight RLE. PT instructed with demo, verbal & tactile cues in 4-pt / 2-pt with crutch & contralateral LE movement to facilitate increased RLE WBing. Pt amb 50' & 52' with verbal cues & supervision.  PT recommended using  this pattern until RLE pain begins to increase, then either sit down or back to 3 point pattern until he can rest RLE. Pt verbalized understanding.   05/03/2023: PT went downstairs and spoke with Aldean Baker, MD.  Upon reviewing his X-rays, Dr. Lajoyce Corners agrees fracture is not healing; he is not concerned with screw that is backing out as ant/post alignment is okay.   Due to limited PT visits and pt is independent with HEP & gait to this point in his rehab. PT, Dr. Lajoyce Corners & patient agreed to hold PT until after next MD appointment. Pt in agreement and understands need to continue with HEP.    05/01/2023: TherEx:  Seated heel slides with towel under Rt foot x 20  BAPS board seated level 1 AAROM CW & CCW 10 reps ea Attempting towel scrunches: x 1 minute Inversion/eversion; c towel under Rt foot x 20 each direction (windshield wiper motion) Seated heel raises 20 reps 4 way ankle exercises: Level 2 band: x 20 each  Quadraped UE raise & LE raise for all 4 extremities 10 reps; contralateral UE/LE lift 5 reps ea.   Pt continues gait with CAM boot with crutches PWB safely.   Modalities:  Vasopneumatic: dual hose ankle & knee for whole leg edema 34 degrees, high compression, x 10 minutes, Rt LE elevated with bolster and pillow     HOME EXERCISE PROGRAM: Access Code: [facility] URL: [email or web address] Date: 04/19/2023 Prepared by: Vladimir Faster  Exercises - Assisted ankle dorsiflexion with strap or towel  - 2-3 x daily - 7 x weekly - 1 sets - 3 reps - 30 seconds hold - Long Sitting Soleus Stretch on Bolster with Strap  - 2-3 x daily - 7 x weekly - 1 sets - 3 reps -  30 seconds hold - Ankle Alphabet in Elevation  - 2-4 x daily - 7 x weekly - 1 sets - 1 reps - 15 minutes elevation hold - Seated Ankle Pumps  - 2-3 x daily - 7 x weekly - 3 sets - 10 reps - 5 seconds hold - Seated Toe Curl  - 2-3 x daily - 7 x weekly - 3 sets - 10 reps - 5 seconds hold - Supine Straight Leg Raises  - 1 x daily - 7 x weekly - 1 sets - 10 reps - 5 seconds hold - Sidelying Hip Abduction  - 1 x daily - 7 x weekly - 1 sets - 10 reps - 5 seconds hold - Prone Hip Extension  - 1 x daily - 7 x weekly - 1 sets - 10 reps - 5 seconds hold - Sidelying Hip Adduction (No weight)  - 1 x daily - 7 x weekly - 1 sets - 10 reps - 5 seconds hold - Seated Heel Raise  - 1 x daily - 7 x weekly - 1 sets - 10 reps - 5 seconds hold - Seated Heel Slide  - 1 x daily - 7 x weekly - 1 sets - 10 reps - 5 seconds hold - Ankle Inversion Eversion Towel Slide  - 1 x daily - 7 x weekly - 1 sets - 10 reps - 5 seconds hold - Seated Marble Transfer with Toes  - 1 x daily - 7 x weekly - 3 sets - 10 reps  ASSESSMENT: CLINICAL IMPRESSION: Patient's right ankle is beginning to heal enough to begin progressing weight bearing within CAM boot.  PT began standing balance and gait pattern to facilitate slight increase in weight  bearing which he tolerated without pain increase.   OBJECTIVE IMPAIRMENTS: Abnormal gait, decreased activity tolerance, decreased balance, decreased endurance, decreased knowledge of condition, decreased knowledge of use of DME, decreased mobility, difficulty walking, decreased ROM, decreased strength, increased edema, impaired flexibility, postural dysfunction, and pain.   ACTIVITY LIMITATIONS: carrying, lifting, bending, standing, squatting, sleeping, stairs, transfers, and locomotion level  PARTICIPATION LIMITATIONS: meal prep, cleaning, laundry, driving, shopping, community activity, occupation, and yard work  PERSONAL FACTORS: Education, Fitness, Time since onset of injury/illness/exacerbation,  and 1-2 comorbidities: see PMH  are also affecting patient's functional outcome.   REHAB POTENTIAL: Good  CLINICAL DECISION MAKING: Stable/uncomplicated  EVALUATION COMPLEXITY: Low   GOALS: Goals reviewed with patient? Yes  SHORT TERM GOALS: (target date for Short term goals 05/12/2023)   1.  Patient will demonstrate independent use of home exercise program to maintain progress from in clinic treatments.  Goal status: MET 04/25/23  2. Patient ambulates >250' & negotiates ramps / curbs safely with crutches & CAM boot WBAT RLE  Goal status: MET 05/01/2023  LONG TERM GOALS: (target dates for all long term goals  07/12/2023 )   1. Patient will demonstrate/report pain at worst less than or equal to 2/10 to facilitate minimal limitation in daily activity secondary to pain symptoms.  Goal status: ongoing 06/12/2023   2. Patient will demonstrate independent use of home exercise program to facilitate ability to maintain/progress functional gains from skilled physical therapy services.  Goal status: ongoing 06/12/2023   3. Patient will demonstrate FOTO outcome > or = 49 % to indicate reduced disability due to condition.  Goal status: ongoing 06/12/2023   4.  Patient will demonstrate right ankle LE MMT >3/5 throughout to faciltiate usual transfers, stairs, squatting at [facility] for daily life.   Goal status: ongoing 06/12/2023   5.  Patient ambulates without assistive device >500' and negotiates ramps, curbs and stairs single rail independently.  Goal status: ongoing 06/12/2023   6.  right ankle PROM dorsiflexion to neutral for standing activities.  Goal status: ongoing 06/12/2023   7.  patient demonstrates & verbalizes ability to perform work related tasks.  Goal Status: ongoing 06/12/2023  PLAN:  PT FREQUENCY:  1-2x/week  PT DURATION: 14 weeks  PLANNED INTERVENTIONS: Therapeutic exercises, Therapeutic activity, Neuro Muscular re-education, Balance training, Gait training, Patient/Family  education, Joint mobilization, Stair training, DME instructions, Dry Needling, Electrical stimulation, Traction, Cryotherapy, vasopneumatic deviceMoist heat, Taping, Ultrasound, Ionotophoresis 4mg /ml Dexamethasone, and aquatic therapy, Manual therapy.  All included unless contraindicated  PLAN FOR NEXT SESSION: progress HEP including standing balance in corner, gait with 2 crutches 4-point or 2-point pattern.     Vladimir Faster, PT, DPT 06/12/2023, 4:11 PM

## 2023-06-13 ENCOUNTER — Encounter: Payer: Self-pay | Admitting: Physical Therapy

## 2023-06-13 ENCOUNTER — Ambulatory Visit: Payer: BC Managed Care – PPO | Admitting: Physical Therapy

## 2023-06-13 DIAGNOSIS — R2689 Other abnormalities of gait and mobility: Secondary | ICD-10-CM

## 2023-06-13 DIAGNOSIS — M6281 Muscle weakness (generalized): Secondary | ICD-10-CM | POA: Diagnosis not present

## 2023-06-13 DIAGNOSIS — M25571 Pain in right ankle and joints of right foot: Secondary | ICD-10-CM | POA: Diagnosis not present

## 2023-06-13 DIAGNOSIS — M25671 Stiffness of right ankle, not elsewhere classified: Secondary | ICD-10-CM

## 2023-06-13 DIAGNOSIS — R2681 Unsteadiness on feet: Secondary | ICD-10-CM

## 2023-06-13 DIAGNOSIS — R600 Localized edema: Secondary | ICD-10-CM | POA: Diagnosis not present

## 2023-06-13 NOTE — Therapy (Signed)
OUTPATIENT PHYSICAL THERAPY LOWER EXTREMITY TREATMENT   Patient Name: APOLONIO WHITON MRN: [id number] DOB:1956/07/22, 67 y.o., male Today's Date: 06/13/2023  END OF SESSION:  PT End of Session - 06/13/23 1016     Visit Number 7    Number of Visits 16    Date for PT Re-Evaluation 07/12/23    Authorization Type [employer] comm PPO    Authorization - Number of Visits 24    Progress Note Due on Visit 15    PT Start Time 1013    PT Stop Time 1101    PT Time Calculation (min) 48 min    Activity Tolerance Patient tolerated treatment well    Behavior During Therapy WFL for tasks assessed/performed               Past Medical History:  Diagnosis Date   Arthritis    Past Surgical History:  Procedure Laterality Date   ANTERIOR CERVICAL DECOMPRESSION/DISCECTOMY FUSION 4 LEVELS N/A 08/07/2017   Procedure: ANTERIOR CERVICAL DECOMPRESSION/DISCECTOMY FUSION CERVICAL 3- CERVICAL 4, CERVICAL 4 - CERVICAL 5, CERVICAL 5- CERVICAL 6, CERVICAL 6- CERVICAL 7;  Surgeon: Shirlean Kelly, MD;  Location: [facility] OR;  Service: Neurosurgery;  Laterality: N/A;   BACK SURGERY  2017   FOOT ARTHRODESIS Right 12/23/2022   Procedure: RIGHT TIBIOCALCANEAL FUSION;  Surgeon: Nadara Mustard, MD;  Location: [facility] OR;  Service: Orthopedics;  Laterality: Right;   Patient Active Problem List   Diagnosis Date Noted   Unstable ankle, right 12/23/2022   Cavovarus deformity of foot, acquired, right 12/23/2022   Lumbar stenosis with neurogenic claudication 12/07/2017   HNP (herniated nucleus pulposus) with myelopathy, cervical 08/07/2017    PCP: No PCP  REFERRING PROVIDER: Aldean Baker, MD  REFERRING DIAG: Z98.1 (ICD-10-CM) - S/P ankle fusion   THERAPY DIAG:  Stiffness of right ankle, not elsewhere classified  Muscle weakness (generalized)  Localized edema  Pain in right ankle and joints of right foot  Other abnormalities of gait and mobility  Unsteadiness on feet  Rationale for Evaluation and Treatment:  Rehabilitation  ONSET DATE: 03/28/2023 MD visit with PT referral / change in WB status  SUBJECTIVE:   SUBJECTIVE STATEMENT: He was sore after last PT session.  He spoke with his boss last night who is asking about return to work.   PERTINENT HISTORY: Arthritis, cervical fusion sg, lumbar fusion sg  PAIN:  NPRS scale: today sitting 4/10 & walking 6-7/10 and over the last week lowest 2/10 & highest 9/10 Pain location: right ankle more in joint Pain description: sitting aches & walking sharp Aggravating factors: walking & standing Relieving factors: sit to rest & elevation  PRECAUTIONS: Fall  WEIGHT BEARING RESTRICTIONS: Yes RLE WBAT with CAM boot  FALLS:  Has patient fallen in last 6 months? No  LIVING ENVIRONMENT: Lives with: lives with their partner and dog 4# Lives in: Mobile home Stairs: Yes: External: 1 steps; none threshold into home from deck.  Ramp up to deck.  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 Lifting up to 50-60#, push / pull.   PLOF: Independent  PATIENT GOALS:  get back to work, walk in community, yard work   Next MD visit: 7/23/20924  OBJECTIVE:  DIAGNOSTIC FINDINGS:  06/08/2023 Three-view radiographs of the right ankle shows interval bony healing.  There has been some loosening of the screws.   03/28/2023 Three-view radiographs of  the right ankle shows backing out of the hardware from the tibial calcaneal fusion.  With persistent lucency of the tibial talar joint.   PATIENT SURVEYS:  Eval / 04/11/2023:   FOTO intake:  13%  predicted:  49%  COGNITION: Overall cognitive status: WFL    SENSATION: Eval / 04/11/2023:    Light touch: Impaired  Proprioception: Impaired   EDEMA:  05/01/2023: LLE: above ankle 21.5 cm,  around ankle 32 cm, below ankle 26.5 cm RLE: above ankle 23.5cm,  around ankle 34.5 cm, below ankle 28.5  cm  Eval / 04/11/2023:    RLE: above ankle 25.4cm,  around ankle 36.8 cm, below ankle 28.5 cm LLE: above ankle 21.8cm,  around ankle 32 cm, below ankle 27 cm   POSTURE: Eval / 04/11/2023:    rounded shoulders, forward head, flexed trunk , and weight shift left  PALPATION: Eval / 04/11/2023:   No open areas, tenderness along ankle joint line, tenderness Achille's Tendon.  LOWER EXTREMITY ROM:   ROM P:passive A:active Right Eval 04/11/23 Right 05/01/23 Right 06/12/23  Hip flexion     Hip extension     Hip abduction     Hip adduction     Hip internal rotation     Hip external rotation     Knee flexion     Knee extension     Ankle dorsiflexion Seated P: -15* A: -18* Seated P: -5* A: -7* Seated Knee ext A: -14* P:-11* Knee flex A: -9* P: -7*  Ankle plantarflexion Seated  P: 20* Seated  P: 28* A:  24*   Ankle inversion     Ankle eversion      (Blank rows = not tested)  LOWER EXTREMITY MMT:  MMT Right Eval 04/11/23  Hip flexion   Hip extension   Hip abduction   Hip adduction   Hip internal rotation   Hip external rotation   Knee flexion   Knee extension   Ankle dorsiflexion 2-/5  Ankle plantarflexion 2-/5  Ankle inversion 2-/5  Ankle eversion 2-/5   (Blank rows = not tested)  GAIT: 04/19/2023: pt amb >200' with CAM boot & crutches WBAT safely.   Eval / 04/11/2023:   Distance walked: 100' Assistive device utilized: Crutches and CAM boot Level of assistance: SBA Comments: WBAT with antalgic pattern significant decrease RLE stance duration.    TODAY'S TREATMENT                                                                          DATE: 06/13/2023: Gait Training: pt amb with 2-point pattern with CAM boot WBAT & crutches with verbal cues. No balance issues noted.  Therapeutic Exercise: PT instructed in updated HEP with demo, verbal & HO cues. Pt verbalized & return demo understanding. see below for corner balance with equal WB BLE and seated ankle  PF/DF Standing in corner head motions 4 directions eyes open & eyes closed on floor and eyes open on foam.  PT noted some sway but no LOB / fall risk. Seated ankle DF with heel on towel roll for greater ROM 10 reps 2 sets Seated ankle PF with toes on towel roll for greater ROM 10 reps 2 sets Seated inversion / eversion  with forefoot on towel 10 reps 2 sets. Seated gastroc stretch with strap 30 sec hold 2 reps  Manual therapy  PROM with gentle overpressure right ankle DF, PF and inversion/eversion.   Vaso right ankle high compression 34* 10 min with elevation.   06/12/2023: TherEx:  See objective data above Toe raises with heel on towel roll and heel raises with forefoot on towel roll 10 reps 2 sets ea.  Neuromuscular Re-education: Hip width stance with pelvic weight shift to midline: eyes open then eyes closed 5 reps ea head motions right/left, up/down & diagonals with CGA / SBA.   Gait Training: Pt arrived ambulating with crutches & CAM boot WBAT with 3 point pattern with minimal weight RLE. PT instructed with demo, verbal & tactile cues in 4-pt / 2-pt with crutch & contralateral LE movement to facilitate increased RLE WBing. Pt amb 50' & 14' with verbal cues & supervision.  PT recommended using this pattern until RLE pain begins to increase, then either sit down or back to 3 point pattern until he can rest RLE. Pt verbalized understanding.   05/03/2023: PT went downstairs and spoke with Aldean Baker, MD.  Upon reviewing his X-rays, Dr. Lajoyce Corners agrees fracture is not healing; he is not concerned with screw that is backing out as ant/post alignment is okay.   Due to limited PT visits and pt is independent with HEP & gait to this point in his rehab. PT, Dr. Lajoyce Corners & patient agreed to hold PT until after next MD appointment. Pt in agreement and understands need to continue with HEP.     HOME EXERCISE PROGRAM: Access Code: [facility] URL: [email or web address] Date:  06/13/2023 Prepared by: Vladimir Faster  Exercises - Assisted ankle dorsiflexion with strap or towel  - 2-3 x daily - 7 x weekly - 1 sets - 3 reps - 30 seconds hold - Long Sitting Soleus Stretch on Bolster with Strap  - 2-3 x daily - 7 x weekly - 1 sets - 3 reps - 30 seconds hold - Ankle Alphabet in Elevation  - 2-4 x daily - 7 x weekly - 1 sets - 1 reps - 15 minutes elevation hold - Seated Ankle Pumps  - 2-3 x daily - 7 x weekly - 3 sets - 10 reps - 5 seconds hold - Seated Toe Curl  - 2-3 x daily - 7 x weekly - 3 sets - 10 reps - 5 seconds hold - Supine Straight Leg Raises  - 1 x daily - 7 x weekly - 1 sets - 10 reps - 5 seconds hold - Sidelying Hip Abduction  - 1 x daily - 7 x weekly - 1 sets - 10 reps - 5 seconds hold - Prone Hip Extension  - 1 x daily - 7 x weekly - 1 sets - 10 reps - 5 seconds hold - Sidelying Hip Adduction (No weight)  - 1 x daily - 7 x weekly - 1 sets - 10 reps - 5 seconds hold - Seated Heel Raise  - 1 x daily - 7 x weekly - 1 sets - 10 reps - 5 seconds hold - Seated Heel Slide  - 1 x daily - 7 x weekly - 1 sets - 10 reps - 5 seconds hold - Ankle Inversion Eversion Towel Slide  - 1 x daily - 7 x weekly - 1 sets - 10 reps - 5 seconds hold - Seated Marble Transfer with Toes  - 1 x daily - 7 x weekly -  3 sets - 10 reps - wide stance head motions eyes open  - 1 x daily - 7 x weekly - 1 sets - 10 reps - Feet Apart with Eyes Closed with Head Motions  - 1 x daily - 7 x weekly - 1 sets - 10 reps - Wide stance on Foam Pad head movements  - 1 x daily - 7 x weekly - 1 sets - 10 reps - Seated Toe Raise  - 1 x daily - 7 x weekly - 2-3 sets - 10 reps - 5 seconds hold - Seated Heel Raise  - 1 x daily - 7 x weekly - 2-3 sets - 10 reps - 5 seconds hold  ASSESSMENT: CLINICAL IMPRESSION: Patient appears to understand updated HEP. He appears to understand gait 2-point pattern to increase WB but BUE support.    OBJECTIVE IMPAIRMENTS: Abnormal gait, decreased activity tolerance, decreased  balance, decreased endurance, decreased knowledge of condition, decreased knowledge of use of DME, decreased mobility, difficulty walking, decreased ROM, decreased strength, increased edema, impaired flexibility, postural dysfunction, and pain.   ACTIVITY LIMITATIONS: carrying, lifting, bending, standing, squatting, sleeping, stairs, transfers, and locomotion level  PARTICIPATION LIMITATIONS: meal prep, cleaning, laundry, driving, shopping, community activity, occupation, and yard work  PERSONAL FACTORS: Education, Fitness, Time since onset of injury/illness/exacerbation, and 1-2 comorbidities: see PMH  are also affecting patient's functional outcome.   REHAB POTENTIAL: Good  CLINICAL DECISION MAKING: Stable/uncomplicated  EVALUATION COMPLEXITY: Low   GOALS: Goals reviewed with patient? Yes  SHORT TERM GOALS: (target date for Short term goals 05/12/2023)   1.  Patient will demonstrate independent use of home exercise program to maintain progress from in clinic treatments.  Goal status: MET 04/25/23  2. Patient ambulates >250' & negotiates ramps / curbs safely with crutches & CAM boot WBAT RLE  Goal status: MET 05/01/2023  LONG TERM GOALS: (target dates for all long term goals  07/12/2023 )   1. Patient will demonstrate/report pain at worst less than or equal to 2/10 to facilitate minimal limitation in daily activity secondary to pain symptoms.  Goal status: ongoing 06/12/2023   2. Patient will demonstrate independent use of home exercise program to facilitate ability to maintain/progress functional gains from skilled physical therapy services.  Goal status: ongoing 06/12/2023   3. Patient will demonstrate FOTO outcome > or = 49 % to indicate reduced disability due to condition.  Goal status: ongoing 06/12/2023   4.  Patient will demonstrate right ankle LE MMT >3/5 throughout to faciltiate usual transfers, stairs, squatting at [facility] for daily life.   Goal status: ongoing 06/12/2023    5.  Patient ambulates without assistive device >500' and negotiates ramps, curbs and stairs single rail independently.  Goal status: ongoing 06/12/2023   6.  right ankle PROM dorsiflexion to neutral for standing activities.  Goal status: ongoing 06/12/2023   7.  patient demonstrates & verbalizes ability to perform work related tasks.  Goal Status: ongoing 06/12/2023  PLAN:  PT FREQUENCY:  1-2x/week  PT DURATION: 14 weeks  PLANNED INTERVENTIONS: Therapeutic exercises, Therapeutic activity, Neuro Muscular re-education, Balance training, Gait training, Patient/Family education, Joint mobilization, Stair training, DME instructions, Dry Needling, Electrical stimulation, Traction, Cryotherapy, vasopneumatic deviceMoist heat, Taping, Ultrasound, Ionotophoresis 4mg /ml Dexamethasone, and aquatic therapy, Manual therapy.  All included unless contraindicated  PLAN FOR NEXT SESSION: check HEP, progress gait & exercise based on pain response    Vladimir Faster, PT, DPT 06/13/2023, 10:52 AM

## 2023-06-20 ENCOUNTER — Ambulatory Visit: Payer: BC Managed Care – PPO | Admitting: Physical Therapy

## 2023-06-20 ENCOUNTER — Encounter: Payer: Self-pay | Admitting: Physical Therapy

## 2023-06-20 DIAGNOSIS — M6281 Muscle weakness (generalized): Secondary | ICD-10-CM | POA: Diagnosis not present

## 2023-06-20 DIAGNOSIS — R600 Localized edema: Secondary | ICD-10-CM | POA: Diagnosis not present

## 2023-06-20 DIAGNOSIS — M25671 Stiffness of right ankle, not elsewhere classified: Secondary | ICD-10-CM

## 2023-06-20 DIAGNOSIS — M25571 Pain in right ankle and joints of right foot: Secondary | ICD-10-CM

## 2023-06-20 DIAGNOSIS — R2681 Unsteadiness on feet: Secondary | ICD-10-CM

## 2023-06-20 DIAGNOSIS — R2689 Other abnormalities of gait and mobility: Secondary | ICD-10-CM

## 2023-06-20 NOTE — Therapy (Signed)
OUTPATIENT PHYSICAL THERAPY LOWER EXTREMITY TREATMENT   Patient Name: Joel Bailey MRN: [id number] DOB:August 25, 1956, 67 y.o., male Today's Date: 06/20/2023  END OF SESSION:  PT End of Session - 06/20/23 0845     Visit Number 8    Number of Visits 16    Date for PT Re-Evaluation 07/12/23    Authorization Type [employer] comm PPO    Authorization - Visit Number 8    Authorization - Number of Visits 24    Progress Note Due on Visit 15    PT Start Time 0844    PT Stop Time 0939    PT Time Calculation (min) 55 min    Activity Tolerance Patient tolerated treatment well    Behavior During Therapy [facility] for tasks assessed/performed                Past Medical History:  Diagnosis Date   Arthritis    Past Surgical History:  Procedure Laterality Date   ANTERIOR CERVICAL DECOMPRESSION/DISCECTOMY FUSION 4 LEVELS N/A 08/07/2017   Procedure: ANTERIOR CERVICAL DECOMPRESSION/DISCECTOMY FUSION CERVICAL 3- CERVICAL 4, CERVICAL 4 - CERVICAL 5, CERVICAL 5- CERVICAL 6, CERVICAL 6- CERVICAL 7;  Surgeon: Shirlean Kelly, MD;  Location: [facility] OR;  Service: Neurosurgery;  Laterality: N/A;   BACK SURGERY  2017   FOOT ARTHRODESIS Right 12/23/2022   Procedure: RIGHT TIBIOCALCANEAL FUSION;  Surgeon: Nadara Mustard, MD;  Location: [facility] OR;  Service: Orthopedics;  Laterality: Right;   Patient Active Problem List   Diagnosis Date Noted   Unstable ankle, right 12/23/2022   Cavovarus deformity of foot, acquired, right 12/23/2022   Lumbar stenosis with neurogenic claudication 12/07/2017   HNP (herniated nucleus pulposus) with myelopathy, cervical 08/07/2017    PCP: No PCP  REFERRING PROVIDER: Aldean Baker, MD  REFERRING DIAG: Z98.1 (ICD-10-CM) - S/P ankle fusion   THERAPY DIAG:  Stiffness of right ankle, not elsewhere classified  Muscle weakness (generalized)  Localized edema  Other abnormalities of gait and mobility  Unsteadiness on feet  Pain in right ankle and joints of right foot  Rationale  for Evaluation and Treatment: Rehabilitation  ONSET DATE: 03/28/2023 MD visit with PT referral / change in WB status  SUBJECTIVE:   SUBJECTIVE STATEMENT: He has been doing exercises in corner and feels his balance is improving.  It does not increase ankle pain.  He has to slow down to walk with opposite crutch / LE motion but does not increase ankle pain unless has to quickly catch himself.    PERTINENT HISTORY: Arthritis, cervical fusion sg, lumbar fusion sg  PAIN:  NPRS scale: today sitting with foot on floor  4/10 & walking 6-7/10 and over the last week lowest 2/10 & highest 9/10 Pain location: right ankle more in joint Pain description: sitting aches & walking sharp Aggravating factors: walking & standing Relieving factors: sit to rest with foot off floor (recliner or on pillows) & elevation  PRECAUTIONS: Fall  WEIGHT BEARING RESTRICTIONS: Yes RLE WBAT with CAM boot  FALLS:  Has patient fallen in last 6 months? No  LIVING ENVIRONMENT: Lives with: lives with their partner and dog 4# Lives in: Mobile home Stairs: Yes: External: 1 steps; none threshold into home from deck.  Ramp up to deck.  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 Lifting up to 50-60#, push / pull.   PLOF: Independent  PATIENT GOALS:  get back to work, walk in community, yard work   Next MD visit: 7/23/20924  OBJECTIVE:  DIAGNOSTIC FINDINGS:  06/08/2023 Three-view radiographs of the right ankle shows interval bony healing.  There has been some loosening of the screws.   03/28/2023 Three-view radiographs of the right ankle shows backing out of the hardware from the tibial calcaneal fusion.  With persistent lucency of the tibial talar joint.   PATIENT SURVEYS:  Eval / 04/11/2023:   FOTO intake:  13%  predicted:  49%  COGNITION: Overall cognitive status:  WFL    SENSATION: Eval / 04/11/2023:    Light touch: Impaired  Proprioception: Impaired   EDEMA:  05/01/2023: LLE: above ankle 21.5 cm,  around ankle 32 cm, below ankle 26.5 cm RLE: above ankle 23.5cm,  around ankle 34.5 cm, below ankle 28.5 cm  Eval / 04/11/2023:    RLE: above ankle 25.4cm,  around ankle 36.8 cm, below ankle 28.5 cm LLE: above ankle 21.8cm,  around ankle 32 cm, below ankle 27 cm   POSTURE: Eval / 04/11/2023:    rounded shoulders, forward head, flexed trunk , and weight shift left  PALPATION: Eval / 04/11/2023:   No open areas, tenderness along ankle joint line, tenderness Achille's Tendon.  LOWER EXTREMITY ROM:   ROM P:passive A:active Right Eval 04/11/23 Right 05/01/23 Right 06/12/23  Hip flexion     Hip extension     Hip abduction     Hip adduction     Hip internal rotation     Hip external rotation     Knee flexion     Knee extension     Ankle dorsiflexion Seated P: -15* A: -18* Seated P: -5* A: -7* Seated Knee ext A: -14* P:-11* Knee flex A: -9* P: -7*  Ankle plantarflexion Seated  P: 20* Seated  P: 28* A:  24*   Ankle inversion     Ankle eversion      (Blank rows = not tested)  LOWER EXTREMITY MMT:  MMT Right Eval 04/11/23  Hip flexion   Hip extension   Hip abduction   Hip adduction   Hip internal rotation   Hip external rotation   Knee flexion   Knee extension   Ankle dorsiflexion 2-/5  Ankle plantarflexion 2-/5  Ankle inversion 2-/5  Ankle eversion 2-/5   (Blank rows = not tested)  GAIT: 04/19/2023: pt amb >200' with CAM boot & crutches WBAT safely.   Eval / 04/11/2023:   Distance walked: 100' Assistive device utilized: Crutches and CAM boot Level of assistance: SBA Comments: WBAT with antalgic pattern significant decrease RLE stance duration.    TODAY'S TREATMENT                                                                          DATE: 06/20/2023: Therapeutic Exercise: Seated ankle DF with heel on towel roll for  greater ROM 10 reps 2 sets Seated ankle PF with toes on towel roll for greater ROM 10 reps 2 sets; 2nd set with light resistance with hands on knees Seated long sit ankle PF yellow T-band 10 reps 2 sets Seated long sit ankle DF yellow T-band 10 reps 2 sets Seated long sit ankle inversion yellow T-band  10 reps 2 sets Seated long sit ankle eversion yellow T-band 10 reps 2 sets Quadraped single UE raise/reach alternating UEs and single LE raise/ext/reach 10 reps ea Nustep level 5 with BLEs (CAM boot) & BUEs 5 min.  Gait Training: Pt amb 50' X 3 with cane stand alone tip & CAM boot initial 50' CGA, then SBA.   Vaso right ankle high compression 34* 10 min with elevation.    06/13/2023: Gait Training: pt amb with 2-point pattern with CAM boot WBAT & crutches with verbal cues. No balance issues noted.  Therapeutic Exercise: PT instructed in updated HEP with demo, verbal & HO cues. Pt verbalized & return demo understanding. see below for corner balance with equal WB BLE and seated ankle PF/DF Standing in corner head motions 4 directions eyes open & eyes closed on floor and eyes open on foam.  PT noted some sway but no LOB / fall risk. Seated ankle DF with heel on towel roll for greater ROM 10 reps 2 sets Seated ankle PF with toes on towel roll for greater ROM 10 reps 2 sets Seated inversion / eversion with forefoot on towel 10 reps 2 sets. Seated gastroc stretch with strap 30 sec hold 2 reps  Manual therapy  PROM with gentle overpressure right ankle DF, PF and inversion/eversion.   Vaso right ankle high compression 34* 10 min with elevation.   06/12/2023: TherEx:  See objective data above Toe raises with heel on towel roll and heel raises with forefoot on towel roll 10 reps 2 sets ea.  Neuromuscular Re-education: Hip width stance with pelvic weight shift to midline: eyes open then eyes closed 5 reps ea head motions right/left, up/down & diagonals with CGA / SBA.   Gait Training: Pt  arrived ambulating with crutches & CAM boot WBAT with 3 point pattern with minimal weight RLE. PT instructed with demo, verbal & tactile cues in 4-pt / 2-pt with crutch & contralateral LE movement to facilitate increased RLE WBing. Pt amb 50' & 11' with verbal cues & supervision.  PT recommended using this pattern until RLE pain begins to increase, then either sit down or back to 3 point pattern until he can rest RLE. Pt verbalized understanding.      HOME EXERCISE PROGRAM: Access Code: [facility] URL: [email or web address] Date: 06/13/2023 Prepared by: Vladimir Faster  Exercises - Assisted ankle dorsiflexion with strap or towel  - 2-3 x daily - 7 x weekly - 1 sets - 3 reps - 30 seconds hold - Long Sitting Soleus Stretch on Bolster with Strap  - 2-3 x daily - 7 x weekly - 1 sets - 3 reps - 30 seconds hold - Ankle Alphabet in Elevation  - 2-4 x daily - 7 x weekly - 1 sets - 1 reps - 15 minutes elevation hold - Seated Ankle Pumps  - 2-3 x daily - 7 x weekly - 3 sets - 10 reps - 5 seconds hold - Seated Toe Curl  - 2-3 x daily - 7 x weekly - 3 sets - 10 reps - 5 seconds hold - Supine Straight Leg Raises  - 1 x daily - 7 x weekly - 1 sets - 10 reps - 5 seconds hold - Sidelying Hip Abduction  - 1 x daily - 7 x weekly - 1 sets - 10 reps - 5 seconds hold - Prone Hip Extension  - 1 x daily - 7 x weekly - 1 sets - 10 reps - 5 seconds hold - Sidelying  Hip Adduction (No weight)  - 1 x daily - 7 x weekly - 1 sets - 10 reps - 5 seconds hold - Seated Heel Raise  - 1 x daily - 7 x weekly - 1 sets - 10 reps - 5 seconds hold - Seated Heel Slide  - 1 x daily - 7 x weekly - 1 sets - 10 reps - 5 seconds hold - Ankle Inversion Eversion Towel Slide  - 1 x daily - 7 x weekly - 1 sets - 10 reps - 5 seconds hold - Seated Marble Transfer with Toes  - 1 x daily - 7 x weekly - 3 sets - 10 reps - wide stance head motions eyes open  - 1 x daily - 7 x weekly - 1 sets - 10 reps - Feet Apart with Eyes Closed with  Head Motions  - 1 x daily - 7 x weekly - 1 sets - 10 reps - Wide stance on Foam Pad head movements  - 1 x daily - 7 x weekly - 1 sets - 10 reps - Seated Toe Raise  - 1 x daily - 7 x weekly - 2-3 sets - 10 reps - 5 seconds hold - Seated Heel Raise  - 1 x daily - 7 x weekly - 2-3 sets - 10 reps - 5 seconds hold  ASSESSMENT: CLINICAL IMPRESSION: PT advanced ankle exercises to light yellow in clinic only. PT also progressed short distance gait to cane.  Pt appeared to tolerate both updates with no increase in pain.    OBJECTIVE IMPAIRMENTS: Abnormal gait, decreased activity tolerance, decreased balance, decreased endurance, decreased knowledge of condition, decreased knowledge of use of DME, decreased mobility, difficulty walking, decreased ROM, decreased strength, increased edema, impaired flexibility, postural dysfunction, and pain.   ACTIVITY LIMITATIONS: carrying, lifting, bending, standing, squatting, sleeping, stairs, transfers, and locomotion level  PARTICIPATION LIMITATIONS: meal prep, cleaning, laundry, driving, shopping, community activity, occupation, and yard work  PERSONAL FACTORS: Education, Fitness, Time since onset of injury/illness/exacerbation, and 1-2 comorbidities: see PMH  are also affecting patient's functional outcome.   REHAB POTENTIAL: Good  CLINICAL DECISION MAKING: Stable/uncomplicated  EVALUATION COMPLEXITY: Low   GOALS: Goals reviewed with patient? Yes  SHORT TERM GOALS: (target date for Short term goals 05/12/2023)   1.  Patient will demonstrate independent use of home exercise program to maintain progress from in clinic treatments.  Goal status: MET 04/25/23  2. Patient ambulates >250' & negotiates ramps / curbs safely with crutches & CAM boot WBAT RLE  Goal status: MET 05/01/2023  LONG TERM GOALS: (target dates for all long term goals  07/12/2023 )   1. Patient will demonstrate/report pain at worst less than or equal to 2/10 to facilitate minimal  limitation in daily activity secondary to pain symptoms.  Goal status: ongoing 06/20/2023   2. Patient will demonstrate independent use of home exercise program to facilitate ability to maintain/progress functional gains from skilled physical therapy services.  Goal status: ongoing 06/20/2023   3. Patient will demonstrate FOTO outcome > or = 49 % to indicate reduced disability due to condition.  Goal status: ongoing 06/20/2023   4.  Patient will demonstrate right ankle LE MMT >3/5 throughout to faciltiate usual transfers, stairs, squatting at [facility] for daily life.   Goal status: ongoing 06/20/2023   5.  Patient ambulates without assistive device >500' and negotiates ramps, curbs and stairs single rail independently.  Goal status: ongoing 06/20/2023   6.  right ankle PROM dorsiflexion  to neutral for standing activities.  Goal status: ongoing 06/20/2023   7.  patient demonstrates & verbalizes ability to perform work related tasks.  Goal Status: ongoing 06/20/2023  PLAN:  PT FREQUENCY:  1-2x/week  PT DURATION: 14 weeks  PLANNED INTERVENTIONS: Therapeutic exercises, Therapeutic activity, Neuro Muscular re-education, Balance training, Gait training, Patient/Family education, Joint mobilization, Stair training, DME instructions, Dry Needling, Electrical stimulation, Traction, Cryotherapy, vasopneumatic deviceMoist heat, Taping, Ultrasound, Ionotophoresis 4mg /ml Dexamethasone, and aquatic therapy, Manual therapy.  All included unless contraindicated  PLAN FOR NEXT SESSION: assess pain response to resistance & short distance gait with cane and add to HEP if no increase, progress gait & exercise based on pain response    Vladimir Faster, PT, DPT 06/20/2023, 1:34 PM

## 2023-06-21 ENCOUNTER — Ambulatory Visit: Payer: BC Managed Care – PPO | Admitting: Physical Therapy

## 2023-06-21 ENCOUNTER — Encounter: Payer: Self-pay | Admitting: Physical Therapy

## 2023-06-21 DIAGNOSIS — M25671 Stiffness of right ankle, not elsewhere classified: Secondary | ICD-10-CM

## 2023-06-21 DIAGNOSIS — R2681 Unsteadiness on feet: Secondary | ICD-10-CM

## 2023-06-21 DIAGNOSIS — R2689 Other abnormalities of gait and mobility: Secondary | ICD-10-CM

## 2023-06-21 DIAGNOSIS — M6281 Muscle weakness (generalized): Secondary | ICD-10-CM | POA: Diagnosis not present

## 2023-06-21 DIAGNOSIS — R600 Localized edema: Secondary | ICD-10-CM

## 2023-06-21 DIAGNOSIS — M25571 Pain in right ankle and joints of right foot: Secondary | ICD-10-CM

## 2023-06-21 NOTE — Therapy (Signed)
OUTPATIENT PHYSICAL THERAPY LOWER EXTREMITY TREATMENT   Patient Name: Joel Bailey MRN: [id number] DOB:1956-02-16, 67 y.o., male Today's Date: 06/21/2023  END OF SESSION:  PT End of Session - 06/21/23 0847     Visit Number 9    Number of Visits 16    Date for PT Re-Evaluation 07/12/23    Authorization Type [employer] comm PPO    Authorization - Visit Number 9    Authorization - Number of Visits 24    Progress Note Due on Visit 15    PT Start Time 0845    PT Stop Time 0940    PT Time Calculation (min) 55 min    Activity Tolerance Patient tolerated treatment well    Behavior During Therapy WFL for tasks assessed/performed                Past Medical History:  Diagnosis Date   Arthritis    Past Surgical History:  Procedure Laterality Date   ANTERIOR CERVICAL DECOMPRESSION/DISCECTOMY FUSION 4 LEVELS N/A 08/07/2017   Procedure: ANTERIOR CERVICAL DECOMPRESSION/DISCECTOMY FUSION CERVICAL 3- CERVICAL 4, CERVICAL 4 - CERVICAL 5, CERVICAL 5- CERVICAL 6, CERVICAL 6- CERVICAL 7;  Surgeon: Shirlean Kelly, MD;  Location: [facility] OR;  Service: Neurosurgery;  Laterality: N/A;   BACK SURGERY  2017   FOOT ARTHRODESIS Right 12/23/2022   Procedure: RIGHT TIBIOCALCANEAL FUSION;  Surgeon: Nadara Mustard, MD;  Location: [facility];  Service: Orthopedics;  Laterality: Right;   Patient Active Problem List   Diagnosis Date Noted   Unstable ankle, right 12/23/2022   Cavovarus deformity of foot, acquired, right 12/23/2022   Lumbar stenosis with neurogenic claudication 12/07/2017   HNP (herniated nucleus pulposus) with myelopathy, cervical 08/07/2017    PCP: No PCP  REFERRING PROVIDER: Aldean Baker, MD  REFERRING DIAG: Z98.1 (ICD-10-CM) - S/P ankle fusion   THERAPY DIAG:  Stiffness of right ankle, not elsewhere classified  Muscle weakness (generalized)  Localized edema  Other abnormalities of gait and mobility  Unsteadiness on feet  Pain in right ankle and joints of right foot  Rationale  for Evaluation and Treatment: Rehabilitation  ONSET DATE: 03/28/2023 MD visit with PT referral / change in WB status  SUBJECTIVE:   SUBJECTIVE STATEMENT: He was a little sore last night after PT and a little more this morning.  But it was not as swollen.  PERTINENT HISTORY: Arthritis, cervical fusion sg, lumbar fusion sg  PAIN:  NPRS scale: today sitting with foot on floor 3-4/10 & walking 6-7/10 and over the last week lowest 2/10 & highest 9/10 Pain location: right ankle more in joint Pain description: sitting aches & walking sharp Aggravating factors: walking & standing Relieving factors: sit to rest with foot off floor (recliner or on pillows) & elevation  PRECAUTIONS: Fall  WEIGHT BEARING RESTRICTIONS: Yes RLE WBAT with CAM boot  FALLS:  Has patient fallen in last 6 months? No  LIVING ENVIRONMENT: Lives with: lives with their partner and dog 4# Lives in: Mobile home Stairs: Yes: External: 1 steps; none threshold into home from deck.  Ramp up to deck.  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 Lifting up to 50-60#, push / pull.   PLOF: Independent  PATIENT GOALS:  get back to work, walk in community, yard work   Next MD visit: 7/23/20924  OBJECTIVE:  DIAGNOSTIC FINDINGS:  06/08/2023 Three-view radiographs of the right  ankle shows interval bony healing.  There has been some loosening of the screws.   03/28/2023 Three-view radiographs of the right ankle shows backing out of the hardware from the tibial calcaneal fusion.  With persistent lucency of the tibial talar joint.   PATIENT SURVEYS:  Eval / 04/11/2023:   FOTO intake:  13%  predicted:  49%  COGNITION: Overall cognitive status: WFL    SENSATION: Eval / 04/11/2023:    Light touch: Impaired  Proprioception: Impaired   EDEMA:  05/01/2023: LLE: above ankle 21.5 cm,  around ankle 32  cm, below ankle 26.5 cm RLE: above ankle 23.5cm,  around ankle 34.5 cm, below ankle 28.5 cm  Eval / 04/11/2023:    RLE: above ankle 25.4cm,  around ankle 36.8 cm, below ankle 28.5 cm LLE: above ankle 21.8cm,  around ankle 32 cm, below ankle 27 cm   POSTURE: Eval / 04/11/2023:    rounded shoulders, forward head, flexed trunk , and weight shift left  PALPATION: Eval / 04/11/2023:   No open areas, tenderness along ankle joint line, tenderness Achille's Tendon.  LOWER EXTREMITY ROM:   ROM P:passive A:active Right Eval 04/11/23 Right 05/01/23 Right 06/12/23 Right 06/21/23  Hip flexion      Hip extension      Hip abduction      Hip adduction      Hip internal rotation      Hip external rotation      Knee flexion      Knee extension      Ankle dorsiflexion Seated P: -15* A: -18* Seated P: -5* A: -7* Seated Knee ext A: -14* P:-11* Knee flex A: -9* P: -7* Seated Knee flex A: -8* P: -6*  Ankle plantarflexion Seated  P: 20* Seated  P: 28* A:  24*    Ankle inversion      Ankle eversion       (Blank rows = not tested)  LOWER EXTREMITY MMT:  MMT Right Eval 04/11/23  Hip flexion   Hip extension   Hip abduction   Hip adduction   Hip internal rotation   Hip external rotation   Knee flexion   Knee extension   Ankle dorsiflexion 2-/5  Ankle plantarflexion 2-/5  Ankle inversion 2-/5  Ankle eversion 2-/5   (Blank rows = not tested)  GAIT: 04/19/2023: pt amb >200' with CAM boot & crutches WBAT safely.   Eval / 04/11/2023:   Distance walked: 100' Assistive device utilized: Crutches and CAM boot Level of assistance: SBA Comments: WBAT with antalgic pattern significant decrease RLE stance duration.    TODAY'S TREATMENT                                                                          DATE: 06/21/2023: Therapeutic Exercise: Seated ankle DF with heel on towel roll for greater ROM 10 reps 2 sets Seated ankle PF with toes on towel roll for greater ROM 10 reps 2 sets;  2nd set with light resistance with hands on knees Seated long sit ankle PF yellow T-band 10 reps 2 sets Seated long sit ankle DF yellow T-band 10 reps 2 sets Seated long sit ankle inversion yellow T-band 10 reps 2 sets Seated long sit  ankle eversion yellow T-band 10 reps 2 sets Pt asked about cramps in both hamstrings and calves. PT educated on need to stretch and reviewed long sit gastroc & hamstring stretch with strap  Nustep seat 12 level 7 with BLEs (RLE shoe) & BUEs 4 min and level 4 with BLEs only 4 min. PT added Yellow T-band to HEP with demo, verbal & HO cues. Pt verbalized and return demo understanding.   Gait Training: Pt amb 50' X 2 with cane stand alone tip & CAM boot safely.   Vaso right ankle high compression 34* 10 min with elevation.     06/20/2023: Therapeutic Exercise: Seated ankle DF with heel on towel roll for greater ROM 10 reps 2 sets Seated ankle PF with toes on towel roll for greater ROM 10 reps 2 sets; 2nd set with light resistance with hands on knees Seated long sit ankle PF yellow T-band 10 reps 2 sets Seated long sit ankle DF yellow T-band 10 reps 2 sets Seated long sit ankle inversion yellow T-band 10 reps 2 sets Seated long sit ankle eversion yellow T-band 10 reps 2 sets Quadraped single UE raise/reach alternating UEs and single LE raise/ext/reach 10 reps ea Nustep level 5 with BLEs (CAM boot) & BUEs 5 min.  Gait Training: Pt amb 50' X 3 with cane stand alone tip & CAM boot initial 50' CGA, then SBA.   Vaso right ankle high compression 34* 10 min with elevation.    06/13/2023: Gait Training: pt amb with 2-point pattern with CAM boot WBAT & crutches with verbal cues. No balance issues noted.  Therapeutic Exercise: PT instructed in updated HEP with demo, verbal & HO cues. Pt verbalized & return demo understanding. see below for corner balance with equal WB BLE and seated ankle PF/DF Standing in corner head motions 4 directions eyes open & eyes closed on  floor and eyes open on foam.  PT noted some sway but no LOB / fall risk. Seated ankle DF with heel on towel roll for greater ROM 10 reps 2 sets Seated ankle PF with toes on towel roll for greater ROM 10 reps 2 sets Seated inversion / eversion with forefoot on towel 10 reps 2 sets. Seated gastroc stretch with strap 30 sec hold 2 reps  Manual therapy  PROM with gentle overpressure right ankle DF, PF and inversion/eversion.   Vaso right ankle high compression 34* 10 min with elevation.     HOME EXERCISE PROGRAM: Access Code: [facility] URL: [email or web address] Date: 06/21/2023 Prepared by: Vladimir Faster  Exercises - Assisted ankle dorsiflexion with strap or towel  - 2-3 x daily - 7 x weekly - 1 sets - 3 reps - 30 seconds hold - Long Sitting Soleus Stretch on Bolster with Strap  - 2-3 x daily - 7 x weekly - 1 sets - 3 reps - 30 seconds hold - Ankle Alphabet in Elevation  - 2-4 x daily - 7 x weekly - 1 sets - 1 reps - 15 minutes elevation hold - Seated Ankle Pumps  - 2-3 x daily - 7 x weekly - 3 sets - 10 reps - 5 seconds hold - Seated Toe Curl  - 2-3 x daily - 7 x weekly - 3 sets - 10 reps - 5 seconds hold - Supine Straight Leg Raises  - 1 x daily - 7 x weekly - 1 sets - 10 reps - 5 seconds hold - Sidelying Hip Abduction  - 1 x daily - 7 x weekly -  1 sets - 10 reps - 5 seconds hold - Prone Hip Extension  - 1 x daily - 7 x weekly - 1 sets - 10 reps - 5 seconds hold - Sidelying Hip Adduction (No weight)  - 1 x daily - 7 x weekly - 1 sets - 10 reps - 5 seconds hold - Seated Heel Raise  - 1 x daily - 7 x weekly - 1 sets - 10 reps - 5 seconds hold - Seated Heel Slide  - 1 x daily - 7 x weekly - 1 sets - 10 reps - 5 seconds hold - Ankle Inversion Eversion Towel Slide  - 1 x daily - 7 x weekly - 1 sets - 10 reps - 5 seconds hold - Seated Marble Transfer with Toes  - 1 x daily - 7 x weekly - 3 sets - 10 reps - wide stance head motions eyes open  - 1 x daily - 7 x weekly - 1 sets -  10 reps - Feet Apart with Eyes Closed with Head Motions  - 1 x daily - 7 x weekly - 1 sets - 10 reps - Wide stance on Foam Pad head movements  - 1 x daily - 7 x weekly - 1 sets - 10 reps - Seated Toe Raise  - 1 x daily - 7 x weekly - 2-3 sets - 10 reps - 5 seconds hold - Seated Heel Raise  - 1 x daily - 7 x weekly - 2-3 sets - 10 reps - 5 seconds hold - Ankle Plantar Flexion with Resistance  - 1 x daily - 7 x weekly - 2 sets - 10 reps - 5 seconds hold - Seated Ankle Inversion with Resistance and Legs Crossed  - 1 x daily - 7 x weekly - 2 sets - 10 reps - 5 seconds hold - Long Sitting Ankle Eversion with Resistance  - 1 x daily - 7 x weekly - 2 sets - 10 reps - 5 seconds hold - Long Sitting Ankle Dorsiflexion with Anchored Resistance  - 1 x daily - 7 x weekly - 2 sets - 10 reps - 5 seconds hold  ASSESSMENT: CLINICAL IMPRESSION: Patient appears to understand updated HEP to include light / yellow resistance.  Pt appears safe to ambulate with cane for limited distances like his home which will enable a free hand to carry item like kitchen to den.   OBJECTIVE IMPAIRMENTS: Abnormal gait, decreased activity tolerance, decreased balance, decreased endurance, decreased knowledge of condition, decreased knowledge of use of DME, decreased mobility, difficulty walking, decreased ROM, decreased strength, increased edema, impaired flexibility, postural dysfunction, and pain.   ACTIVITY LIMITATIONS: carrying, lifting, bending, standing, squatting, sleeping, stairs, transfers, and locomotion level  PARTICIPATION LIMITATIONS: meal prep, cleaning, laundry, driving, shopping, community activity, occupation, and yard work  PERSONAL FACTORS: Education, Fitness, Time since onset of injury/illness/exacerbation, and 1-2 comorbidities: see PMH  are also affecting patient's functional outcome.   REHAB POTENTIAL: Good  CLINICAL DECISION MAKING: Stable/uncomplicated  EVALUATION COMPLEXITY: Low   GOALS: Goals  reviewed with patient? Yes  SHORT TERM GOALS: (target date for Short term goals 05/12/2023)   1.  Patient will demonstrate independent use of home exercise program to maintain progress from in clinic treatments.  Goal status: MET 04/25/23  2. Patient ambulates >250' & negotiates ramps / curbs safely with crutches & CAM boot WBAT RLE  Goal status: MET 05/01/2023  LONG TERM GOALS: (target dates for all long term goals  07/12/2023 )  1. Patient will demonstrate/report pain at worst less than or equal to 2/10 to facilitate minimal limitation in daily activity secondary to pain symptoms.  Goal status: ongoing 06/20/2023   2. Patient will demonstrate independent use of home exercise program to facilitate ability to maintain/progress functional gains from skilled physical therapy services.  Goal status: ongoing 06/20/2023   3. Patient will demonstrate FOTO outcome > or = 49 % to indicate reduced disability due to condition.  Goal status: ongoing 06/20/2023   4.  Patient will demonstrate right ankle LE MMT >3/5 throughout to faciltiate usual transfers, stairs, squatting at [facility] for daily life.   Goal status: ongoing 06/20/2023   5.  Patient ambulates without assistive device >500' and negotiates ramps, curbs and stairs single rail independently.  Goal status: ongoing 06/20/2023   6.  right ankle PROM dorsiflexion to neutral for standing activities.  Goal status: ongoing 06/20/2023   7.  patient demonstrates & verbalizes ability to perform work related tasks.  Goal Status: ongoing 06/20/2023  PLAN:  PT FREQUENCY:  1-2x/week  PT DURATION: 14 weeks  PLANNED INTERVENTIONS: Therapeutic exercises, Therapeutic activity, Neuro Muscular re-education, Balance training, Gait training, Patient/Family education, Joint mobilization, Stair training, DME instructions, Dry Needling, Electrical stimulation, Traction, Cryotherapy, vasopneumatic deviceMoist heat, Taping, Ultrasound, Ionotophoresis 4mg /ml  Dexamethasone, and aquatic therapy, Manual therapy.  All included unless contraindicated  PLAN FOR NEXT SESSION:   assess pain response to new exercises and limited cane use in home, progress gait & exercise based on pain response    Vladimir Faster, PT, DPT 06/21/2023, 10:11 AM

## 2023-06-26 ENCOUNTER — Ambulatory Visit: Payer: BC Managed Care – PPO | Admitting: Physical Therapy

## 2023-06-26 ENCOUNTER — Encounter: Payer: Self-pay | Admitting: Physical Therapy

## 2023-06-26 DIAGNOSIS — R2681 Unsteadiness on feet: Secondary | ICD-10-CM

## 2023-06-26 DIAGNOSIS — R600 Localized edema: Secondary | ICD-10-CM | POA: Diagnosis not present

## 2023-06-26 DIAGNOSIS — M6281 Muscle weakness (generalized): Secondary | ICD-10-CM

## 2023-06-26 DIAGNOSIS — M25571 Pain in right ankle and joints of right foot: Secondary | ICD-10-CM

## 2023-06-26 DIAGNOSIS — R2689 Other abnormalities of gait and mobility: Secondary | ICD-10-CM | POA: Diagnosis not present

## 2023-06-26 DIAGNOSIS — M25671 Stiffness of right ankle, not elsewhere classified: Secondary | ICD-10-CM | POA: Diagnosis not present

## 2023-06-26 NOTE — Therapy (Signed)
OUTPATIENT PHYSICAL THERAPY LOWER EXTREMITY TREATMENT   Patient Name: Joel Bailey MRN: [id number] DOB:04/18/1956, 67 y.o., male Today's Date: 06/26/2023  END OF SESSION:  PT End of Session - 06/26/23 1054     Visit Number 10    Number of Visits 16    Date for PT Re-Evaluation 07/12/23    Authorization Type [employer] comm PPO    Authorization - Number of Visits 24    Progress Note Due on Visit 15    PT Start Time 1100    PT Stop Time 1155    PT Time Calculation (min) 55 min    Activity Tolerance Patient tolerated treatment well    Behavior During Therapy WFL for tasks assessed/performed                 Past Medical History:  Diagnosis Date   Arthritis    Past Surgical History:  Procedure Laterality Date   ANTERIOR CERVICAL DECOMPRESSION/DISCECTOMY FUSION 4 LEVELS N/A 08/07/2017   Procedure: ANTERIOR CERVICAL DECOMPRESSION/DISCECTOMY FUSION CERVICAL 3- CERVICAL 4, CERVICAL 4 - CERVICAL 5, CERVICAL 5- CERVICAL 6, CERVICAL 6- CERVICAL 7;  Surgeon: Shirlean Kelly, MD;  Location: [facility] OR;  Service: Neurosurgery;  Laterality: N/A;   BACK SURGERY  2017   FOOT ARTHRODESIS Right 12/23/2022   Procedure: RIGHT TIBIOCALCANEAL FUSION;  Surgeon: Nadara Mustard, MD;  Location: [facility] OR;  Service: Orthopedics;  Laterality: Right;   Patient Active Problem List   Diagnosis Date Noted   Unstable ankle, right 12/23/2022   Cavovarus deformity of foot, acquired, right 12/23/2022   Lumbar stenosis with neurogenic claudication 12/07/2017   HNP (herniated nucleus pulposus) with myelopathy, cervical 08/07/2017    PCP: No PCP  REFERRING PROVIDER: Aldean Baker, MD  REFERRING DIAG: Z98.1 (ICD-10-CM) - S/P ankle fusion   THERAPY DIAG:  Stiffness of right ankle, not elsewhere classified  Muscle weakness (generalized)  Localized edema  Other abnormalities of gait and mobility  Unsteadiness on feet  Pain in right ankle and joints of right foot  Rationale for Evaluation and Treatment:  Rehabilitation  ONSET DATE: 03/28/2023 MD visit with PT referral / change in WB status  SUBJECTIVE:   SUBJECTIVE STATEMENT: He walked with crutches to go out to eat.  He thought that he had a cane but does not.   PERTINENT HISTORY: Arthritis, cervical fusion sg, lumbar fusion sg  PAIN:  NPRS scale: today sitting with foot on floor  3-4/10 & walking 6-7/10 and over the last week lowest 2/10 & highest 9/10 Pain location: right ankle more in joint Pain description: sitting aches & walking sharp Aggravating factors: walking & standing Relieving factors: sit to rest with foot off floor (recliner or on pillows) & elevation  PRECAUTIONS: Fall  WEIGHT BEARING RESTRICTIONS: Yes RLE WBAT with CAM boot  FALLS:  Has patient fallen in last 6 months? No  LIVING ENVIRONMENT: Lives with: lives with their partner and dog 4# Lives in: Mobile home Stairs: Yes: External: 1 steps; none threshold into home from deck.  Ramp up to deck.  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 Lifting up to 50-60#, push / pull.   PLOF: Independent  PATIENT GOALS:  get back to work, walk in community, yard work   Next MD visit: 7/23/20924  OBJECTIVE:  DIAGNOSTIC FINDINGS:  06/08/2023 Three-view radiographs of the right ankle shows interval bony healing.  There  has been some loosening of the screws.   03/28/2023 Three-view radiographs of the right ankle shows backing out of the hardware from the tibial calcaneal fusion.  With persistent lucency of the tibial talar joint.   PATIENT SURVEYS:  Eval / 04/11/2023:   FOTO intake:  13%  predicted:  49%  COGNITION: Overall cognitive status: WFL    SENSATION: Eval / 04/11/2023:    Light touch: Impaired  Proprioception: Impaired   EDEMA:  05/01/2023: LLE: above ankle 21.5 cm,  around ankle 32 cm, below ankle 26.5 cm RLE: above ankle  23.5cm,  around ankle 34.5 cm, below ankle 28.5 cm  Eval / 04/11/2023:    RLE: above ankle 25.4cm,  around ankle 36.8 cm, below ankle 28.5 cm LLE: above ankle 21.8cm,  around ankle 32 cm, below ankle 27 cm   POSTURE: Eval / 04/11/2023:    rounded shoulders, forward head, flexed trunk , and weight shift left  PALPATION: Eval / 04/11/2023:   No open areas, tenderness along ankle joint line, tenderness Achille's Tendon.  LOWER EXTREMITY ROM:   ROM P:passive A:active Right Eval 04/11/23 Right 05/01/23 Right 06/12/23 Right 06/21/23 Right 06/26/23  Hip flexion       Hip extension       Hip abduction       Hip adduction       Hip internal rotation       Hip external rotation       Knee flexion       Knee extension       Ankle dorsiflexion Seated P: -15* A: -18* Seated P: -5* A: -7* Seated Knee ext A: -14* P:-11* Knee flex A: -9* P: -7* Seated Knee flex A: -8* P: -6* Seated Knee ext A: -10* P:-7* Knee flex A: -6* P: -5*  Ankle plantarflexion Seated  P: 20* Seated  P: 28* A:  24*     Ankle inversion       Ankle eversion        (Blank rows = not tested)  LOWER EXTREMITY MMT:  MMT Right Eval 04/11/23  Hip flexion   Hip extension   Hip abduction   Hip adduction   Hip internal rotation   Hip external rotation   Knee flexion   Knee extension   Ankle dorsiflexion 2-/5  Ankle plantarflexion 2-/5  Ankle inversion 2-/5  Ankle eversion 2-/5   (Blank rows = not tested)  GAIT: 04/19/2023: pt amb >200' with CAM boot & crutches WBAT safely.   Eval / 04/11/2023:   Distance walked: 100' Assistive device utilized: Crutches and CAM boot Level of assistance: SBA Comments: WBAT with antalgic pattern significant decrease RLE stance duration.    TODAY'S TREATMENT                                                                          DATE: 06/26/2023: Therapeutic Exercise: Nustep seat 12 level 7 with BLEs (RLE CAM boot - forgot shoe) & BUEs 4 min and level 5 with BLEs only  4 min. Leg press BLEs 100# 15 reps 2 sets; RLE only 50# 10 reps 2 sets - all with CAM boot RLE Standing CAM boot LE green theraband resistance with cane support: 10  reps ea with BLEs abd, ext, add & flex touching foot on end range and start position. Seated no CAM boot, BAPS level 2 10 reps ea 2 sets - DF/PF, inv / ever, circles CW & CCW.    Gait Training: Pt amb 50' X 2 with cane stand alone tip / std tip & CAM boot safely. Pt appears safe with either cane tip. PT educated with demo & verbal cues and trial std tip vs stand alone on cane.   Vaso right ankle high compression 34* 10 min with elevation.   06/21/2023: Therapeutic Exercise: Seated ankle DF with heel on towel roll for greater ROM 10 reps 2 sets Seated ankle PF with toes on towel roll for greater ROM 10 reps 2 sets; 2nd set with light resistance with hands on knees Seated long sit ankle PF yellow T-band 10 reps 2 sets Seated long sit ankle DF yellow T-band 10 reps 2 sets Seated long sit ankle inversion yellow T-band 10 reps 2 sets Seated long sit ankle eversion yellow T-band 10 reps 2 sets Pt asked about cramps in both hamstrings and calves. PT educated on need to stretch and reviewed long sit gastroc & hamstring stretch with strap  Nustep seat 12 level 7 with BLEs (RLE shoe) & BUEs 4 min and level 4 with BLEs only 4 min. PT added Yellow T-band to HEP with demo, verbal & HO cues. Pt verbalized and return demo understanding.   Gait Training: Pt amb 50' X 2 with cane stand alone tip & CAM boot safely.   Vaso right ankle high compression 34* 10 min with elevation.     06/20/2023: Therapeutic Exercise: Seated ankle DF with heel on towel roll for greater ROM 10 reps 2 sets Seated ankle PF with toes on towel roll for greater ROM 10 reps 2 sets; 2nd set with light resistance with hands on knees Seated long sit ankle PF yellow T-band 10 reps 2 sets Seated long sit ankle DF yellow T-band 10 reps 2 sets Seated long sit ankle  inversion yellow T-band 10 reps 2 sets Seated long sit ankle eversion yellow T-band 10 reps 2 sets Quadraped single UE raise/reach alternating UEs and single LE raise/ext/reach 10 reps ea Nustep level 5 with BLEs (CAM boot) & BUEs 5 min.  Gait Training: Pt amb 50' X 3 with cane stand alone tip & CAM boot initial 50' CGA, then SBA.   Vaso right ankle high compression 34* 10 min with elevation.     HOME EXERCISE PROGRAM: Access Code: [facility] URL: [email or web address] Date: 06/21/2023 Prepared by: Vladimir Faster  Exercises - Assisted ankle dorsiflexion with strap or towel  - 2-3 x daily - 7 x weekly - 1 sets - 3 reps - 30 seconds hold - Long Sitting Soleus Stretch on Bolster with Strap  - 2-3 x daily - 7 x weekly - 1 sets - 3 reps - 30 seconds hold - Ankle Alphabet in Elevation  - 2-4 x daily - 7 x weekly - 1 sets - 1 reps - 15 minutes elevation hold - Seated Ankle Pumps  - 2-3 x daily - 7 x weekly - 3 sets - 10 reps - 5 seconds hold - Seated Toe Curl  - 2-3 x daily - 7 x weekly - 3 sets - 10 reps - 5 seconds hold - Supine Straight Leg Raises  - 1 x daily - 7 x weekly - 1 sets - 10 reps - 5 seconds hold - Sidelying Hip Abduction  -  1 x daily - 7 x weekly - 1 sets - 10 reps - 5 seconds hold - Prone Hip Extension  - 1 x daily - 7 x weekly - 1 sets - 10 reps - 5 seconds hold - Sidelying Hip Adduction (No weight)  - 1 x daily - 7 x weekly - 1 sets - 10 reps - 5 seconds hold - Seated Heel Raise  - 1 x daily - 7 x weekly - 1 sets - 10 reps - 5 seconds hold - Seated Heel Slide  - 1 x daily - 7 x weekly - 1 sets - 10 reps - 5 seconds hold - Ankle Inversion Eversion Towel Slide  - 1 x daily - 7 x weekly - 1 sets - 10 reps - 5 seconds hold - Seated Marble Transfer with Toes  - 1 x daily - 7 x weekly - 3 sets - 10 reps - wide stance head motions eyes open  - 1 x daily - 7 x weekly - 1 sets - 10 reps - Feet Apart with Eyes Closed with Head Motions  - 1 x daily - 7 x weekly - 1 sets -  10 reps - Wide stance on Foam Pad head movements  - 1 x daily - 7 x weekly - 1 sets - 10 reps - Seated Toe Raise  - 1 x daily - 7 x weekly - 2-3 sets - 10 reps - 5 seconds hold - Seated Heel Raise  - 1 x daily - 7 x weekly - 2-3 sets - 10 reps - 5 seconds hold - Ankle Plantar Flexion with Resistance  - 1 x daily - 7 x weekly - 2 sets - 10 reps - 5 seconds hold - Seated Ankle Inversion with Resistance and Legs Crossed  - 1 x daily - 7 x weekly - 2 sets - 10 reps - 5 seconds hold - Long Sitting Ankle Eversion with Resistance  - 1 x daily - 7 x weekly - 2 sets - 10 reps - 5 seconds hold - Long Sitting Ankle Dorsiflexion with Anchored Resistance  - 1 x daily - 7 x weekly - 2 sets - 10 reps - 5 seconds hold  ASSESSMENT: CLINICAL IMPRESSION: PT progressed exercises to include more strengthening focus.  Patient tolerated higher intensity to exercises in clinic.   OBJECTIVE IMPAIRMENTS: Abnormal gait, decreased activity tolerance, decreased balance, decreased endurance, decreased knowledge of condition, decreased knowledge of use of DME, decreased mobility, difficulty walking, decreased ROM, decreased strength, increased edema, impaired flexibility, postural dysfunction, and pain.   ACTIVITY LIMITATIONS: carrying, lifting, bending, standing, squatting, sleeping, stairs, transfers, and locomotion level  PARTICIPATION LIMITATIONS: meal prep, cleaning, laundry, driving, shopping, community activity, occupation, and yard work  PERSONAL FACTORS: Education, Fitness, Time since onset of injury/illness/exacerbation, and 1-2 comorbidities: see PMH  are also affecting patient's functional outcome.   REHAB POTENTIAL: Good  CLINICAL DECISION MAKING: Stable/uncomplicated  EVALUATION COMPLEXITY: Low   GOALS: Goals reviewed with patient? Yes  SHORT TERM GOALS: (target date for Short term goals 05/12/2023)   1.  Patient will demonstrate independent use of home exercise program to maintain progress from in  clinic treatments.  Goal status: MET 04/25/23  2. Patient ambulates >250' & negotiates ramps / curbs safely with crutches & CAM boot WBAT RLE  Goal status: MET 05/01/2023  LONG TERM GOALS: (target dates for all long term goals  07/12/2023 )   1. Patient will demonstrate/report pain at worst less than or equal  to 2/10 to facilitate minimal limitation in daily activity secondary to pain symptoms.  Goal status: ongoing 06/20/2023   2. Patient will demonstrate independent use of home exercise program to facilitate ability to maintain/progress functional gains from skilled physical therapy services.  Goal status: ongoing 06/20/2023   3. Patient will demonstrate FOTO outcome > or = 49 % to indicate reduced disability due to condition.  Goal status: ongoing 06/20/2023   4.  Patient will demonstrate right ankle LE MMT >3/5 throughout to faciltiate usual transfers, stairs, squatting at [facility] for daily life.   Goal status: ongoing 06/20/2023   5.  Patient ambulates without assistive device >500' and negotiates ramps, curbs and stairs single rail independently.  Goal status: ongoing 06/20/2023   6.  right ankle PROM dorsiflexion to neutral for standing activities.  Goal status: ongoing 06/20/2023   7.  patient demonstrates & verbalizes ability to perform work related tasks.  Goal Status: ongoing 06/20/2023  PLAN:  PT FREQUENCY:  1-2x/week  PT DURATION: 14 weeks  PLANNED INTERVENTIONS: Therapeutic exercises, Therapeutic activity, Neuro Muscular re-education, Balance training, Gait training, Patient/Family education, Joint mobilization, Stair training, DME instructions, Dry Needling, Electrical stimulation, Traction, Cryotherapy, vasopneumatic deviceMoist heat, Taping, Ultrasound, Ionotophoresis 4mg /ml Dexamethasone, and aquatic therapy, Manual therapy.  All included unless contraindicated  PLAN FOR NEXT SESSION:   send MD note,  assess pain response to new exercises in PT 7/15, progress gait & exercise  based on pain response    Vladimir Faster, PT, DPT 06/26/2023, 12:38 PM

## 2023-06-28 ENCOUNTER — Encounter: Payer: Self-pay | Admitting: Radiology

## 2023-06-28 ENCOUNTER — Ambulatory Visit (INDEPENDENT_AMBULATORY_CARE_PROVIDER_SITE_OTHER): Payer: BC Managed Care – PPO | Admitting: Physical Therapy

## 2023-06-28 ENCOUNTER — Encounter: Payer: Self-pay | Admitting: Physical Therapy

## 2023-06-28 DIAGNOSIS — R600 Localized edema: Secondary | ICD-10-CM | POA: Diagnosis not present

## 2023-06-28 DIAGNOSIS — M25671 Stiffness of right ankle, not elsewhere classified: Secondary | ICD-10-CM

## 2023-06-28 DIAGNOSIS — M25571 Pain in right ankle and joints of right foot: Secondary | ICD-10-CM

## 2023-06-28 DIAGNOSIS — R2681 Unsteadiness on feet: Secondary | ICD-10-CM

## 2023-06-28 DIAGNOSIS — M6281 Muscle weakness (generalized): Secondary | ICD-10-CM | POA: Diagnosis not present

## 2023-06-28 DIAGNOSIS — R2689 Other abnormalities of gait and mobility: Secondary | ICD-10-CM

## 2023-06-28 NOTE — Therapy (Signed)
OUTPATIENT PHYSICAL THERAPY LOWER EXTREMITY TREATMENT & PROGRESS NOTE   Patient Name: Joel Bailey MRN: [id number] DOB:Mar 05, 1956, 67 y.o., male Today's Date: 06/28/2023  Progress Note Reporting Period 05/03/2023 to 06/28/2023  See note below for Objective Data and Assessment of Progress/Goals.    END OF SESSION:  PT End of Session - 06/28/23 1141     Visit Number 11    Number of Visits 16    Date for PT Re-Evaluation 07/12/23    Authorization Type [employer] comm PPO    Authorization - Visit Number 11    Authorization - Number of Visits 24    Progress Note Due on Visit 21    PT Start Time 1141    PT Stop Time 1232    PT Time Calculation (min) 51 min    Activity Tolerance Patient tolerated treatment well    Behavior During Therapy WFL for tasks assessed/performed                  Past Medical History:  Diagnosis Date   Arthritis    Past Surgical History:  Procedure Laterality Date   ANTERIOR CERVICAL DECOMPRESSION/DISCECTOMY FUSION 4 LEVELS N/A 08/07/2017   Procedure: ANTERIOR CERVICAL DECOMPRESSION/DISCECTOMY FUSION CERVICAL 3- CERVICAL 4, CERVICAL 4 - CERVICAL 5, CERVICAL 5- CERVICAL 6, CERVICAL 6- CERVICAL 7;  Surgeon: Shirlean Kelly, MD;  Location: [facility] OR;  Service: Neurosurgery;  Laterality: N/A;   BACK SURGERY  2017   FOOT ARTHRODESIS Right 12/23/2022   Procedure: RIGHT TIBIOCALCANEAL FUSION;  Surgeon: Nadara Mustard, MD;  Location: [facility] OR;  Service: Orthopedics;  Laterality: Right;   Patient Active Problem List   Diagnosis Date Noted   Unstable ankle, right 12/23/2022   Cavovarus deformity of foot, acquired, right 12/23/2022   Lumbar stenosis with neurogenic claudication 12/07/2017   HNP (herniated nucleus pulposus) with myelopathy, cervical 08/07/2017    PCP: No PCP  REFERRING PROVIDER: Aldean Baker, MD  REFERRING DIAG: Z98.1 (ICD-10-CM) - S/P ankle fusion   THERAPY DIAG:  Stiffness of right ankle, not elsewhere classified  Muscle weakness  (generalized)  Localized edema  Other abnormalities of gait and mobility  Unsteadiness on feet  Pain in right ankle and joints of right foot  Rationale for Evaluation and Treatment: Rehabilitation  ONSET DATE: 03/28/2023 MD visit with PT referral / change in WB status  SUBJECTIVE:   SUBJECTIVE STATEMENT: No changes since last PT. He ordered a cane.  PERTINENT HISTORY: Arthritis, cervical fusion sg, lumbar fusion sg  PAIN:  NPRS scale: today sitting with foot on floor 3-4/10 & walking 6-7/10 and over the last week lowest 2/10 & highest 9/10 Pain location: right ankle more in joint Pain description: sitting aches & walking sharp Aggravating factors: walking & standing Relieving factors: sit to rest with foot off floor (recliner or on pillows) & elevation  PRECAUTIONS: Fall  WEIGHT BEARING RESTRICTIONS: Yes RLE WBAT with CAM boot  FALLS:  Has patient fallen in last 6 months? No  LIVING ENVIRONMENT: Lives with: lives with their partner and dog 4# Lives in: Mobile home Stairs: Yes: External: 1 steps; none threshold into home from deck.  Ramp up to deck.  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 Lifting up to 50-60#, push / pull.   PLOF: Independent  PATIENT GOALS:  get back to work, walk in community, yard work   Next MD visit:  7/23/20924  OBJECTIVE:  DIAGNOSTIC FINDINGS:  06/08/2023 Three-view radiographs of the right ankle shows interval bony healing.  There has been some loosening of the screws.   03/28/2023 Three-view radiographs of the right ankle shows backing out of the hardware from the tibial calcaneal fusion.  With persistent lucency of the tibial talar joint.   PATIENT SURVEYS:  06/28/2023  visit 11 FOTO 52%  Eval / 04/11/2023:   FOTO intake:  13%  predicted:  49%  COGNITION: Overall cognitive status:  WFL    SENSATION: Eval / 04/11/2023:    Light touch: Impaired  Proprioception: Impaired   EDEMA:  05/01/2023: LLE: above ankle 21.5 cm,  around ankle 32 cm, below ankle 26.5 cm RLE: above ankle 23.5cm,  around ankle 34.5 cm, below ankle 28.5 cm  Eval / 04/11/2023:    RLE: above ankle 25.4cm,  around ankle 36.8 cm, below ankle 28.5 cm LLE: above ankle 21.8cm,  around ankle 32 cm, below ankle 27 cm   POSTURE: Eval / 04/11/2023:    rounded shoulders, forward head, flexed trunk , and weight shift left  PALPATION: Eval / 04/11/2023:   No open areas, tenderness along ankle joint line, tenderness Achille's Tendon.  LOWER EXTREMITY ROM:   ROM P:passive A:active Right Eval 04/11/23 Right 05/01/23 Right 06/12/23 Right 06/21/23 Right 06/26/23 Right 06/28/23  Hip flexion        Hip extension        Hip abduction        Hip adduction        Hip internal rotation        Hip external rotation        Knee flexion        Knee extension        Ankle dorsiflexion Seated P: -15* A: -18* Seated P: -5* A: -7* Seated Knee ext A: -14* P:-11* Knee flex A: -9* P: -7* Seated Knee flex A: -8* P: -6* Seated Knee ext A: -10* P:-7* Knee flex A: -6* P: -5* Seated Knee ext A: -10* P:-7* Knee flex A: -6* P: -5*  Ankle plantarflexion Seated  P: 20* Seated  P: 28* A:  24*    Seated P: 33* A: 27*  Ankle inversion        Ankle eversion         (Blank rows = not tested)  LOWER EXTREMITY MMT:  MMT Right Eval 04/11/23  Hip flexion   Hip extension   Hip abduction   Hip adduction   Hip internal rotation   Hip external rotation   Knee flexion   Knee extension   Ankle dorsiflexion 2-/5  Ankle plantarflexion 2-/5  Ankle inversion 2-/5  Ankle eversion 2-/5   (Blank rows = not tested)  GAIT: 06/28/2023: pt amb with cane & CAM boot >200' and neg ramp/curb modified independent.   04/19/2023: pt amb >200' with CAM boot & crutches WBAT safely.   Eval / 04/11/2023:   Distance walked:  100' Assistive device utilized: Crutches and CAM boot Level of assistance: SBA Comments: WBAT with antalgic pattern significant decrease RLE stance duration.    TODAY'S TREATMENT  DATE: 06/28/2023: Therapeutic Exercise: Nustep seat 12 level 5 with BLEs only (RLE CAM boot - forgot shoe) 8 min. Leg press BLEs 100# 15 reps 2 sets; RLE only 50# 10 reps 2 sets - all with CAM boot RLE Standing CAM boot LE green theraband resistance with cane support: 10 reps ea with BLEs abd, ext, add & flex touching foot on end range and start position. Seated no CAM boot, BAPS level 2 10 reps ea 2 sets - DF/PF, inv / ever, circles CW & CCW.   Gait Training: See objective  Vaso right ankle high compression 34* 10 min with elevation.   06/26/2023: Therapeutic Exercise: Nustep seat 12 level 7 with BLEs (RLE CAM boot - forgot shoe) & BUEs 4 min and level 5 with BLEs only 4 min. Leg press BLEs 100# 15 reps 2 sets; RLE only 50# 10 reps 2 sets - all with CAM boot RLE Standing CAM boot LE green theraband resistance with cane support: 10 reps ea with BLEs abd, ext, add & flex touching foot on end range and start position. Seated no CAM boot, BAPS level 2 10 reps ea 2 sets - DF/PF, inv / ever, circles CW & CCW.    Gait Training: Pt amb 50' X 2 with cane stand alone tip / std tip & CAM boot safely. Pt appears safe with either cane tip. PT educated with demo & verbal cues and trial std tip vs stand alone on cane.   Vaso right ankle high compression 34* 10 min with elevation.   06/21/2023: Therapeutic Exercise: Seated ankle DF with heel on towel roll for greater ROM 10 reps 2 sets Seated ankle PF with toes on towel roll for greater ROM 10 reps 2 sets; 2nd set with light resistance with hands on knees Seated long sit ankle PF yellow T-band 10 reps 2 sets Seated long sit ankle DF yellow T-band 10 reps 2 sets Seated long sit ankle inversion  yellow T-band 10 reps 2 sets Seated long sit ankle eversion yellow T-band 10 reps 2 sets Pt asked about cramps in both hamstrings and calves. PT educated on need to stretch and reviewed long sit gastroc & hamstring stretch with strap  Nustep seat 12 level 7 with BLEs (RLE shoe) & BUEs 4 min and level 4 with BLEs only 4 min. PT added Yellow T-band to HEP with demo, verbal & HO cues. Pt verbalized and return demo understanding.   Gait Training: Pt amb 50' X 2 with cane stand alone tip & CAM boot safely.   Vaso right ankle high compression 34* 10 min with elevation.     HOME EXERCISE PROGRAM: Access Code: [facility] URL: [email or web address] Date: 06/21/2023 Prepared by: Vladimir Faster  Exercises - Assisted ankle dorsiflexion with strap or towel  - 2-3 x daily - 7 x weekly - 1 sets - 3 reps - 30 seconds hold - Long Sitting Soleus Stretch on Bolster with Strap  - 2-3 x daily - 7 x weekly - 1 sets - 3 reps - 30 seconds hold - Ankle Alphabet in Elevation  - 2-4 x daily - 7 x weekly - 1 sets - 1 reps - 15 minutes elevation hold - Seated Ankle Pumps  - 2-3 x daily - 7 x weekly - 3 sets - 10 reps - 5 seconds hold - Seated Toe Curl  - 2-3 x daily - 7 x weekly - 3 sets - 10 reps - 5 seconds hold - Supine Straight Leg Raises  -  1 x daily - 7 x weekly - 1 sets - 10 reps - 5 seconds hold - Sidelying Hip Abduction  - 1 x daily - 7 x weekly - 1 sets - 10 reps - 5 seconds hold - Prone Hip Extension  - 1 x daily - 7 x weekly - 1 sets - 10 reps - 5 seconds hold - Sidelying Hip Adduction (No weight)  - 1 x daily - 7 x weekly - 1 sets - 10 reps - 5 seconds hold - Seated Heel Raise  - 1 x daily - 7 x weekly - 1 sets - 10 reps - 5 seconds hold - Seated Heel Slide  - 1 x daily - 7 x weekly - 1 sets - 10 reps - 5 seconds hold - Ankle Inversion Eversion Towel Slide  - 1 x daily - 7 x weekly - 1 sets - 10 reps - 5 seconds hold - Seated Marble Transfer with Toes  - 1 x daily - 7 x weekly - 3 sets -  10 reps - wide stance head motions eyes open  - 1 x daily - 7 x weekly - 1 sets - 10 reps - Feet Apart with Eyes Closed with Head Motions  - 1 x daily - 7 x weekly - 1 sets - 10 reps - Wide stance on Foam Pad head movements  - 1 x daily - 7 x weekly - 1 sets - 10 reps - Seated Toe Raise  - 1 x daily - 7 x weekly - 2-3 sets - 10 reps - 5 seconds hold - Seated Heel Raise  - 1 x daily - 7 x weekly - 2-3 sets - 10 reps - 5 seconds hold - Ankle Plantar Flexion with Resistance  - 1 x daily - 7 x weekly - 2 sets - 10 reps - 5 seconds hold - Seated Ankle Inversion with Resistance and Legs Crossed  - 1 x daily - 7 x weekly - 2 sets - 10 reps - 5 seconds hold - Long Sitting Ankle Eversion with Resistance  - 1 x daily - 7 x weekly - 2 sets - 10 reps - 5 seconds hold - Long Sitting Ankle Dorsiflexion with Anchored Resistance  - 1 x daily - 7 x weekly - 2 sets - 10 reps - 5 seconds hold  ASSESSMENT: CLINICAL IMPRESSION: Patient appears safe to use cane for limited community activities with CAM boot.  PT limiting activities until MD clears him for activities outside of CAM boot.  He is tolerating strength training in PT clinic. He continues to benefit from skilled PT.   OBJECTIVE IMPAIRMENTS: Abnormal gait, decreased activity tolerance, decreased balance, decreased endurance, decreased knowledge of condition, decreased knowledge of use of DME, decreased mobility, difficulty walking, decreased ROM, decreased strength, increased edema, impaired flexibility, postural dysfunction, and pain.   ACTIVITY LIMITATIONS: carrying, lifting, bending, standing, squatting, sleeping, stairs, transfers, and locomotion level  PARTICIPATION LIMITATIONS: meal prep, cleaning, laundry, driving, shopping, community activity, occupation, and yard work  PERSONAL FACTORS: Education, Fitness, Time since onset of injury/illness/exacerbation, and 1-2 comorbidities: see PMH  are also affecting patient's functional outcome.   REHAB  POTENTIAL: Good  CLINICAL DECISION MAKING: Stable/uncomplicated  EVALUATION COMPLEXITY: Low   GOALS: Goals reviewed with patient? Yes  SHORT TERM GOALS: (target date for Short term goals 05/12/2023)   1.  Patient will demonstrate independent use of home exercise program to maintain progress from in clinic treatments.  Goal status: MET 04/25/23  2. Patient ambulates >250' & negotiates ramps / curbs safely with crutches & CAM boot WBAT RLE  Goal status: MET 05/01/2023  LONG TERM GOALS: (target dates for all long term goals  07/12/2023 )   1. Patient will demonstrate/report pain at worst less than or equal to 2/10 to facilitate minimal limitation in daily activity secondary to pain symptoms.  Goal status: ongoing 06/20/2023   2. Patient will demonstrate independent use of home exercise program to facilitate ability to maintain/progress functional gains from skilled physical therapy services.  Goal status: ongoing 06/20/2023   3. Patient will demonstrate FOTO outcome > or = 49 % to indicate reduced disability due to condition.  Goal status: MET 06/28/2023    4.  Patient will demonstrate right ankle LE MMT >3/5 throughout to faciltiate usual transfers, stairs, squatting at [facility] for daily life.   Goal status: ongoing 06/20/2023   5.  Patient ambulates without assistive device >500' and negotiates ramps, curbs and stairs single rail independently.  Goal status: ongoing 06/20/2023   6.  right ankle PROM dorsiflexion to neutral for standing activities.  Goal status: ongoing 06/20/2023   7.  patient demonstrates & verbalizes ability to perform work related tasks.  Goal Status: ongoing 06/20/2023  PLAN:  PT FREQUENCY:  1-2x/week  PT DURATION: 14 weeks  PLANNED INTERVENTIONS: Therapeutic exercises, Therapeutic activity, Neuro Muscular re-education, Balance training, Gait training, Patient/Family education, Joint mobilization, Stair training, DME instructions, Dry Needling, Electrical  stimulation, Traction, Cryotherapy, vasopneumatic deviceMoist heat, Taping, Ultrasound, Ionotophoresis 4mg /ml Dexamethasone, and aquatic therapy, Manual therapy.  All included unless contraindicated  PLAN FOR NEXT SESSION:   check MD note, progress gait & exercise based on pain response & MD note    Vladimir Faster, PT, DPT 06/28/2023, 12:25 PM

## 2023-07-03 ENCOUNTER — Encounter: Payer: BC Managed Care – PPO | Admitting: Physical Therapy

## 2023-07-04 ENCOUNTER — Ambulatory Visit: Payer: BC Managed Care – PPO | Admitting: Physical Therapy

## 2023-07-04 ENCOUNTER — Ambulatory Visit: Payer: BC Managed Care – PPO | Admitting: Orthopedic Surgery

## 2023-07-04 ENCOUNTER — Ambulatory Visit: Payer: Self-pay

## 2023-07-04 ENCOUNTER — Encounter: Payer: Self-pay | Admitting: Physical Therapy

## 2023-07-04 DIAGNOSIS — R2681 Unsteadiness on feet: Secondary | ICD-10-CM

## 2023-07-04 DIAGNOSIS — Z981 Arthrodesis status: Secondary | ICD-10-CM | POA: Diagnosis not present

## 2023-07-04 DIAGNOSIS — R2689 Other abnormalities of gait and mobility: Secondary | ICD-10-CM

## 2023-07-04 DIAGNOSIS — M6281 Muscle weakness (generalized): Secondary | ICD-10-CM | POA: Diagnosis not present

## 2023-07-04 DIAGNOSIS — M25571 Pain in right ankle and joints of right foot: Secondary | ICD-10-CM

## 2023-07-04 DIAGNOSIS — M25671 Stiffness of right ankle, not elsewhere classified: Secondary | ICD-10-CM | POA: Diagnosis not present

## 2023-07-04 DIAGNOSIS — R600 Localized edema: Secondary | ICD-10-CM

## 2023-07-04 NOTE — Therapy (Signed)
OUTPATIENT PHYSICAL THERAPY LOWER EXTREMITY TREATMENT   Patient Name: Joel Bailey MRN: [id number] DOB:1956-05-25, 67 y.o., male Today's Date: 07/04/2023  END OF SESSION:  PT End of Session - 07/04/23 1430     Visit Number 12    Number of Visits 16    Date for PT Re-Evaluation 07/12/23    Authorization Type [employer] comm PPO    Authorization - Visit Number 12    Authorization - Number of Visits 24    Progress Note Due on Visit 21    PT Start Time 1430    PT Stop Time 1525    PT Time Calculation (min) 55 min    Activity Tolerance Patient tolerated treatment well    Behavior During Therapy WFL for tasks assessed/performed                   Past Medical History:  Diagnosis Date   Arthritis    Past Surgical History:  Procedure Laterality Date   ANTERIOR CERVICAL DECOMPRESSION/DISCECTOMY FUSION 4 LEVELS N/A 08/07/2017   Procedure: ANTERIOR CERVICAL DECOMPRESSION/DISCECTOMY FUSION CERVICAL 3- CERVICAL 4, CERVICAL 4 - CERVICAL 5, CERVICAL 5- CERVICAL 6, CERVICAL 6- CERVICAL 7;  Surgeon: Shirlean Kelly, MD;  Location: [facility] OR;  Service: Neurosurgery;  Laterality: N/A;   BACK SURGERY  2017   FOOT ARTHRODESIS Right 12/23/2022   Procedure: RIGHT TIBIOCALCANEAL FUSION;  Surgeon: Nadara Mustard, MD;  Location: [facility] OR;  Service: Orthopedics;  Laterality: Right;   Patient Active Problem List   Diagnosis Date Noted   Unstable ankle, right 12/23/2022   Cavovarus deformity of foot, acquired, right 12/23/2022   Lumbar stenosis with neurogenic claudication 12/07/2017   HNP (herniated nucleus pulposus) with myelopathy, cervical 08/07/2017    PCP: No PCP  REFERRING PROVIDER: Aldean Baker, MD  REFERRING DIAG: Z98.1 (ICD-10-CM) - S/P ankle fusion   THERAPY DIAG:  Stiffness of right ankle, not elsewhere classified  Muscle weakness (generalized)  Localized edema  Other abnormalities of gait and mobility  Unsteadiness on feet  Pain in right ankle and joints of right  foot  Rationale for Evaluation and Treatment: Rehabilitation  ONSET DATE: 03/28/2023 MD visit with PT referral / change in WB status  SUBJECTIVE:   SUBJECTIVE STATEMENT: He saw Dr. Lajoyce Corners just prior to PT. The X-ray look good with healing.  He moved him to ASO brace.    PERTINENT HISTORY: Arthritis, cervical fusion sg, lumbar fusion sg  PAIN:  NPRS scale: today sitting with foot on floor 3-4/10 & walking 6-7/10 and over the last week lowest 2/10 & highest 9/10 Pain location: right ankle more in joint Pain description: sitting aches & walking sharp Aggravating factors: walking & standing Relieving factors: sit to rest with foot off floor (recliner or on pillows) & elevation  PRECAUTIONS: Fall  WEIGHT BEARING RESTRICTIONS: Yes RLE WBAT with CAM boot  FALLS:  Has patient fallen in last 6 months? No  LIVING ENVIRONMENT: Lives with: lives with their partner and dog 4# Lives in: Mobile home Stairs: Yes: External: 1 steps; none threshold into home from deck.  Ramp up to deck.  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 Lifting up to 50-60#, push / pull.   PLOF: Independent  PATIENT GOALS:  get back to work, walk in community, yard work   Next MD visit: 7/23/20924  OBJECTIVE:  DIAGNOSTIC FINDINGS:  06/08/2023 Three-view  radiographs of the right ankle shows interval bony healing.  There has been some loosening of the screws.   03/28/2023 Three-view radiographs of the right ankle shows backing out of the hardware from the tibial calcaneal fusion.  With persistent lucency of the tibial talar joint.   PATIENT SURVEYS:  06/28/2023  visit 11 FOTO 52%  Eval / 04/11/2023:   FOTO intake:  13%  predicted:  49%  COGNITION: Overall cognitive status: WFL    SENSATION: Eval / 04/11/2023:    Light touch: Impaired  Proprioception: Impaired   EDEMA:   05/01/2023: LLE: above ankle 21.5 cm,  around ankle 32 cm, below ankle 26.5 cm RLE: above ankle 23.5cm,  around ankle 34.5 cm, below ankle 28.5 cm  Eval / 04/11/2023:    RLE: above ankle 25.4cm,  around ankle 36.8 cm, below ankle 28.5 cm LLE: above ankle 21.8cm,  around ankle 32 cm, below ankle 27 cm   POSTURE: Eval / 04/11/2023:    rounded shoulders, forward head, flexed trunk , and weight shift left  PALPATION: Eval / 04/11/2023:   No open areas, tenderness along ankle joint line, tenderness Achille's Tendon.  LOWER EXTREMITY ROM:   ROM P:passive A:active Right Eval 04/11/23 Right 05/01/23 Right 06/12/23 Right 06/21/23 Right 06/26/23 Right 06/28/23  Hip flexion        Hip extension        Hip abduction        Hip adduction        Hip internal rotation        Hip external rotation        Knee flexion        Knee extension        Ankle dorsiflexion Seated P: -15* A: -18* Seated P: -5* A: -7* Seated Knee ext A: -14* P:-11* Knee flex A: -9* P: -7* Seated Knee flex A: -8* P: -6* Seated Knee ext A: -10* P:-7* Knee flex A: -6* P: -5* Seated Knee ext A: -10* P:-7* Knee flex A: -6* P: -5*  Ankle plantarflexion Seated  P: 20* Seated  P: 28* A:  24*    Seated P: 33* A: 27*  Ankle inversion        Ankle eversion         (Blank rows = not tested)  LOWER EXTREMITY MMT:  MMT Right Eval 04/11/23  Hip flexion   Hip extension   Hip abduction   Hip adduction   Hip internal rotation   Hip external rotation   Knee flexion   Knee extension   Ankle dorsiflexion 2-/5  Ankle plantarflexion 2-/5  Ankle inversion 2-/5  Ankle eversion 2-/5   (Blank rows = not tested)  GAIT: 06/28/2023: pt amb with cane & CAM boot >200' and neg ramp/curb modified independent.   04/19/2023: pt amb >200' with CAM boot & crutches WBAT safely.   Eval / 04/11/2023:   Distance walked: 100' Assistive device utilized: Crutches and CAM boot Level of assistance: SBA Comments: WBAT with  antalgic pattern significant decrease RLE stance duration.    TODAY'S TREATMENT  DATE: 07/04/2023: Therapeutic Exercise: Nustep seat 12 level 5 with BLEs only (RLE CAM boot - forgot shoe) 10 min. Seated no CAM boot, BAPS level 2 10 reps ea 2 sets - DF/PF, inv / ever, circles CW & CCW.   Self-Care: PT demo & verbal cues on donning ASO. Pt verbalized and return demo understanding.  PT recommended standing 10 min per hour for 10+ hours each day.  Once he can do 2 days without increase in pain or edema, then increase to 15 min per hour.  Goal is to build up to standing to most of work hours. Pt verbalized understanding.  Neuromuscular Re-education Balance facilitating ankle strategy: sock footed on foam hip width in corner with chair in front 10 reps ea eyes open then eyes closed head movements right/left, up/down & diagonals. sock footed on foam hip width in corner with chair in front: ankle DF with posterior pelvic weight shift 15 reps & lateral weight shifts 15 reps   Vaso right ankle high compression 34* 10 min with elevation.    06/28/2023: Therapeutic Exercise: Nustep seat 12 level 5 with BLEs only (RLE CAM boot - forgot shoe) 8 min. Leg press BLEs 100# 15 reps 2 sets; RLE only 50# 10 reps 2 sets - all with CAM boot RLE Standing CAM boot LE green theraband resistance with cane support: 10 reps ea with BLEs abd, ext, add & flex touching foot on end range and start position. Seated no CAM boot, BAPS level 2 10 reps ea 2 sets - DF/PF, inv / ever, circles CW & CCW.   Gait Training: See objective  Vaso right ankle high compression 34* 10 min with elevation.   06/26/2023: Therapeutic Exercise: Nustep seat 12 level 7 with BLEs (RLE CAM boot - forgot shoe) & BUEs 4 min and level 5 with BLEs only 4 min. Leg press BLEs 100# 15 reps 2 sets; RLE only 50# 10 reps 2 sets - all with CAM boot RLE Standing CAM boot LE green  theraband resistance with cane support: 10 reps ea with BLEs abd, ext, add & flex touching foot on end range and start position. Seated no CAM boot, BAPS level 2 10 reps ea 2 sets - DF/PF, inv / ever, circles CW & CCW.    Gait Training: Pt amb 50' X 2 with cane stand alone tip / std tip & CAM boot safely. Pt appears safe with either cane tip. PT educated with demo & verbal cues and trial std tip vs stand alone on cane.   Vaso right ankle high compression 34* 10 min with elevation.     HOME EXERCISE PROGRAM: Access Code: [facility] URL: [email or web address] Date: 06/21/2023 Prepared by: Vladimir Faster  Exercises - Assisted ankle dorsiflexion with strap or towel  - 2-3 x daily - 7 x weekly - 1 sets - 3 reps - 30 seconds hold - Long Sitting Soleus Stretch on Bolster with Strap  - 2-3 x daily - 7 x weekly - 1 sets - 3 reps - 30 seconds hold - Ankle Alphabet in Elevation  - 2-4 x daily - 7 x weekly - 1 sets - 1 reps - 15 minutes elevation hold - Seated Ankle Pumps  - 2-3 x daily - 7 x weekly - 3 sets - 10 reps - 5 seconds hold - Seated Toe Curl  - 2-3 x daily - 7 x weekly - 3 sets - 10 reps - 5 seconds hold - Supine Straight Leg Raises  - 1  x daily - 7 x weekly - 1 sets - 10 reps - 5 seconds hold - Sidelying Hip Abduction  - 1 x daily - 7 x weekly - 1 sets - 10 reps - 5 seconds hold - Prone Hip Extension  - 1 x daily - 7 x weekly - 1 sets - 10 reps - 5 seconds hold - Sidelying Hip Adduction (No weight)  - 1 x daily - 7 x weekly - 1 sets - 10 reps - 5 seconds hold - Seated Heel Raise  - 1 x daily - 7 x weekly - 1 sets - 10 reps - 5 seconds hold - Seated Heel Slide  - 1 x daily - 7 x weekly - 1 sets - 10 reps - 5 seconds hold - Ankle Inversion Eversion Towel Slide  - 1 x daily - 7 x weekly - 1 sets - 10 reps - 5 seconds hold - Seated Marble Transfer with Toes  - 1 x daily - 7 x weekly - 3 sets - 10 reps - wide stance head motions eyes open  - 1 x daily - 7 x weekly - 1 sets - 10  reps - Feet Apart with Eyes Closed with Head Motions  - 1 x daily - 7 x weekly - 1 sets - 10 reps - Wide stance on Foam Pad head movements  - 1 x daily - 7 x weekly - 1 sets - 10 reps - Seated Toe Raise  - 1 x daily - 7 x weekly - 2-3 sets - 10 reps - 5 seconds hold - Seated Heel Raise  - 1 x daily - 7 x weekly - 2-3 sets - 10 reps - 5 seconds hold - Ankle Plantar Flexion with Resistance  - 1 x daily - 7 x weekly - 2 sets - 10 reps - 5 seconds hold - Seated Ankle Inversion with Resistance and Legs Crossed  - 1 x daily - 7 x weekly - 2 sets - 10 reps - 5 seconds hold - Long Sitting Ankle Eversion with Resistance  - 1 x daily - 7 x weekly - 2 sets - 10 reps - 5 seconds hold - Long Sitting Ankle Dorsiflexion with Anchored Resistance  - 1 x daily - 7 x weekly - 2 sets - 10 reps - 5 seconds hold  ASSESSMENT: CLINICAL IMPRESSION: Patient appears to understand how to don ASO. He also appears to understand PT recommendations for standing 10 min per hour for 10 hours to build standing tolerance for work.  He continues to benefit from skilled PT.   OBJECTIVE IMPAIRMENTS: Abnormal gait, decreased activity tolerance, decreased balance, decreased endurance, decreased knowledge of condition, decreased knowledge of use of DME, decreased mobility, difficulty walking, decreased ROM, decreased strength, increased edema, impaired flexibility, postural dysfunction, and pain.   ACTIVITY LIMITATIONS: carrying, lifting, bending, standing, squatting, sleeping, stairs, transfers, and locomotion level  PARTICIPATION LIMITATIONS: meal prep, cleaning, laundry, driving, shopping, community activity, occupation, and yard work  PERSONAL FACTORS: Education, Fitness, Time since onset of injury/illness/exacerbation, and 1-2 comorbidities: see PMH  are also affecting patient's functional outcome.   REHAB POTENTIAL: Good  CLINICAL DECISION MAKING: Stable/uncomplicated  EVALUATION COMPLEXITY: Low   GOALS: Goals reviewed  with patient? Yes  SHORT TERM GOALS: (target date for Short term goals 05/12/2023)   1.  Patient will demonstrate independent use of home exercise program to maintain progress from in clinic treatments.  Goal status: MET 04/25/23  2. Patient ambulates >250' & negotiates  ramps / curbs safely with crutches & CAM boot WBAT RLE  Goal status: MET 05/01/2023  LONG TERM GOALS: (target dates for all long term goals  07/12/2023 )   1. Patient will demonstrate/report pain at worst less than or equal to 2/10 to facilitate minimal limitation in daily activity secondary to pain symptoms.  Goal status: ongoing 06/20/2023   2. Patient will demonstrate independent use of home exercise program to facilitate ability to maintain/progress functional gains from skilled physical therapy services.  Goal status: ongoing 06/20/2023   3. Patient will demonstrate FOTO outcome > or = 49 % to indicate reduced disability due to condition.  Goal status: MET 06/28/2023    4.  Patient will demonstrate right ankle LE MMT >3/5 throughout to faciltiate usual transfers, stairs, squatting at [facility] for daily life.   Goal status: ongoing 06/20/2023   5.  Patient ambulates without assistive device >500' and negotiates ramps, curbs and stairs single rail independently.  Goal status: ongoing 06/20/2023   6.  right ankle PROM dorsiflexion to neutral for standing activities.  Goal status: ongoing 06/20/2023   7.  patient demonstrates & verbalizes ability to perform work related tasks.  Goal Status: ongoing 06/20/2023  PLAN:  PT FREQUENCY:  1-2x/week  PT DURATION: 14 weeks  PLANNED INTERVENTIONS: Therapeutic exercises, Therapeutic activity, Neuro Muscular re-education, Balance training, Gait training, Patient/Family education, Joint mobilization, Stair training, DME instructions, Dry Needling, Electrical stimulation, Traction, Cryotherapy, vasopneumatic deviceMoist heat, Taping, Ultrasound, Ionotophoresis 4mg /ml Dexamethasone, and  aquatic therapy, Manual therapy.  All included unless contraindicated  PLAN FOR NEXT SESSION:   check signed MD note, update HEP,  progress gait & exercise based on pain response    Vladimir Faster, PT, DPT 07/04/2023, 3:20 PM

## 2023-07-05 ENCOUNTER — Encounter: Payer: BC Managed Care – PPO | Admitting: Physical Therapy

## 2023-07-06 ENCOUNTER — Encounter: Payer: Self-pay | Admitting: Physical Therapy

## 2023-07-06 ENCOUNTER — Encounter: Payer: Self-pay | Admitting: Orthopedic Surgery

## 2023-07-06 ENCOUNTER — Ambulatory Visit: Payer: BC Managed Care – PPO | Admitting: Physical Therapy

## 2023-07-06 ENCOUNTER — Ambulatory Visit: Payer: BC Managed Care – PPO | Admitting: Orthopedic Surgery

## 2023-07-06 DIAGNOSIS — M25571 Pain in right ankle and joints of right foot: Secondary | ICD-10-CM

## 2023-07-06 DIAGNOSIS — R600 Localized edema: Secondary | ICD-10-CM | POA: Diagnosis not present

## 2023-07-06 DIAGNOSIS — M25671 Stiffness of right ankle, not elsewhere classified: Secondary | ICD-10-CM | POA: Diagnosis not present

## 2023-07-06 DIAGNOSIS — M6281 Muscle weakness (generalized): Secondary | ICD-10-CM | POA: Diagnosis not present

## 2023-07-06 DIAGNOSIS — R2681 Unsteadiness on feet: Secondary | ICD-10-CM | POA: Diagnosis not present

## 2023-07-06 DIAGNOSIS — R2689 Other abnormalities of gait and mobility: Secondary | ICD-10-CM

## 2023-07-06 NOTE — Therapy (Signed)
OUTPATIENT PHYSICAL THERAPY LOWER EXTREMITY TREATMENT & RECERTIFICATION   Patient Name: Joel Bailey MRN: [id number] DOB:1956-07-17, 67 y.o., male Today's Date: 07/06/2023  END OF SESSION:  PT End of Session - 07/06/23 1307     Visit Number 13    Number of Visits 24    Date for PT Re-Evaluation 09/28/23    Authorization Type [employer]BS comm PPO    Authorization Time Period $45 COPAY, DED MET, OOP Remaining $4,058.48, 24 Visits PT/OT/CHIRO COMBINED 12/12/2022    Authorization - Visit Number 13    Authorization - Number of Visits 24    Progress Note Due on Visit --    PT Start Time 1305    PT Stop Time 1345    PT Time Calculation (min) 40 min    Activity Tolerance Patient tolerated treatment well    Behavior During Therapy WFL for tasks assessed/performed                    Past Medical History:  Diagnosis Date   Arthritis    Past Surgical History:  Procedure Laterality Date   ANTERIOR CERVICAL DECOMPRESSION/DISCECTOMY FUSION 4 LEVELS N/A 08/07/2017   Procedure: ANTERIOR CERVICAL DECOMPRESSION/DISCECTOMY FUSION CERVICAL 3- CERVICAL 4, CERVICAL 4 - CERVICAL 5, CERVICAL 5- CERVICAL 6, CERVICAL 6- CERVICAL 7;  Surgeon: Shirlean Kelly, MD;  Location: [facility] OR;  Service: Neurosurgery;  Laterality: N/A;   BACK SURGERY  2017   FOOT ARTHRODESIS Right 12/23/2022   Procedure: RIGHT TIBIOCALCANEAL FUSION;  Surgeon: Nadara Mustard, MD;  Location: [facility] OR;  Service: Orthopedics;  Laterality: Right;   Patient Active Problem List   Diagnosis Date Noted   Unstable ankle, right 12/23/2022   Cavovarus deformity of foot, acquired, right 12/23/2022   Lumbar stenosis with neurogenic claudication 12/07/2017   HNP (herniated nucleus pulposus) with myelopathy, cervical 08/07/2017    PCP: No PCP  REFERRING PROVIDER: Aldean Baker, MD  REFERRING DIAG: Z98.1 (ICD-10-CM) - S/P ankle fusion   THERAPY DIAG:  Stiffness of right ankle, not elsewhere classified  Muscle weakness  (generalized)  Localized edema  Unsteadiness on feet  Other abnormalities of gait and mobility  Pain in right ankle and joints of right foot  Rationale for Evaluation and Treatment: Rehabilitation  ONSET DATE: 03/28/2023 MD visit with PT referral / change in WB status  SUBJECTIVE:   SUBJECTIVE STATEMENT: He could not get the ASO brace on with his shoe   PERTINENT HISTORY: Arthritis, cervical fusion sg, lumbar fusion sg  PAIN:  NPRS scale: today sitting with foot on floor 3-4/10 & walking 6-7/10 and over the last week lowest 2/10 & highest 9/10 Pain location: right ankle more in joint Pain description: sitting aches & walking sharp Aggravating factors: walking & standing Relieving factors: sit to rest with foot off floor (recliner or on pillows) & elevation  PRECAUTIONS: Fall  WEIGHT BEARING RESTRICTIONS: Yes RLE WBAT with CAM boot  FALLS:  Has patient fallen in last 6 months? No  LIVING ENVIRONMENT: Lives with: lives with their partner and dog 4# Lives in: Mobile home Stairs: Yes: External: 1 steps; none threshold into home from deck.  Ramp up to deck.  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 Lifting up to 50-60#, push / pull.   PLOF: Independent  PATIENT GOALS:  get back to work, walk in community, yard work   Next MD  visit: 7/23/20924  OBJECTIVE:  DIAGNOSTIC FINDINGS:  06/08/2023 Three-view radiographs of the right ankle shows interval bony healing.  There has been some loosening of the screws.   03/28/2023 Three-view radiographs of the right ankle shows backing out of the hardware from the tibial calcaneal fusion.  With persistent lucency of the tibial talar joint.   PATIENT SURVEYS:  06/28/2023  visit 11 FOTO 52%  Eval / 04/11/2023:   FOTO intake:  13%  predicted:  49%  COGNITION: Overall cognitive status:  WFL    SENSATION: Eval / 04/11/2023:    Light touch: Impaired  Proprioception: Impaired   EDEMA:  05/01/2023: LLE: above ankle 21.5 cm,  around ankle 32 cm, below ankle 26.5 cm RLE: above ankle 23.5cm,  around ankle 34.5 cm, below ankle 28.5 cm  Eval / 04/11/2023:    RLE: above ankle 25.4cm,  around ankle 36.8 cm, below ankle 28.5 cm LLE: above ankle 21.8cm,  around ankle 32 cm, below ankle 27 cm   POSTURE: Eval / 04/11/2023:    rounded shoulders, forward head, flexed trunk , and weight shift left  PALPATION: Eval / 04/11/2023:   No open areas, tenderness along ankle joint line, tenderness Achille's Tendon.  LOWER EXTREMITY ROM:   ROM P:passive A:active Right Eval 04/11/23 Right 05/01/23 Right 06/12/23 Right 06/21/23 Right 06/26/23 Right 06/28/23  Hip flexion        Hip extension        Hip abduction        Hip adduction        Hip internal rotation        Hip external rotation        Knee flexion        Knee extension        Ankle dorsiflexion Seated P: -15* A: -18* Seated P: -5* A: -7* Seated Knee ext A: -14* P:-11* Knee flex A: -9* P: -7* Seated Knee flex A: -8* P: -6* Seated Knee ext A: -10* P:-7* Knee flex A: -6* P: -5* Seated Knee ext A: -10* P:-7* Knee flex A: -6* P: -5*  Ankle plantarflexion Seated  P: 20* Seated  P: 28* A:  24*    Seated P: 33* A: 27*  Ankle inversion        Ankle eversion         (Blank rows = not tested)  LOWER EXTREMITY MMT:  MMT Right Eval 04/11/23  Hip flexion   Hip extension   Hip abduction   Hip adduction   Hip internal rotation   Hip external rotation   Knee flexion   Knee extension   Ankle dorsiflexion 2-/5  Ankle plantarflexion 2-/5  Ankle inversion 2-/5  Ankle eversion 2-/5   (Blank rows = not tested)  GAIT: 07/06/2023: pt amb with cane >200' modified independent.   06/28/2023: pt amb with cane & CAM boot >200' and neg ramp/curb modified independent.   04/19/2023: pt amb >200' with CAM boot &  crutches WBAT safely.   Eval / 04/11/2023:   Distance walked: 100' Assistive device utilized: Crutches and CAM boot Level of assistance: SBA Comments: WBAT with antalgic pattern significant decrease RLE stance duration.    TODAY'S TREATMENT  DATE: 07/06/2023: Therapeutic Exercise: PT updated & reviewed HEP with HO, demo & verbal cues (see below). Pt return demo understanding.   07/04/2023: Therapeutic Exercise: Nustep seat 12 level 5 with BLEs only (RLE CAM boot - forgot shoe) 10 min. Seated no CAM boot, BAPS level 2 10 reps ea 2 sets - DF/PF, inv / ever, circles CW & CCW.   Self-Care: PT demo & verbal cues on donning ASO. Pt verbalized and return demo understanding.  PT recommended standing 10 min per hour for 10+ hours each day.  Once he can do 2 days without increase in pain or edema, then increase to 15 min per hour.  Goal is to build up to standing to most of work hours. Pt verbalized understanding.  Neuromuscular Re-education Balance facilitating ankle strategy: sock footed on foam hip width in corner with chair in front 10 reps ea eyes open then eyes closed head movements right/left, up/down & diagonals. sock footed on foam hip width in corner with chair in front: ankle DF with posterior pelvic weight shift 15 reps & lateral weight shifts 15 reps   Vaso right ankle high compression 34* 10 min with elevation.    06/28/2023: Therapeutic Exercise: Nustep seat 12 level 5 with BLEs only (RLE CAM boot - forgot shoe) 8 min. Leg press BLEs 100# 15 reps 2 sets; RLE only 50# 10 reps 2 sets - all with CAM boot RLE Standing CAM boot LE green theraband resistance with cane support: 10 reps ea with BLEs abd, ext, add & flex touching foot on end range and start position. Seated no CAM boot, BAPS level 2 10 reps ea 2 sets - DF/PF, inv / ever, circles CW & CCW.   Gait Training: See objective  Vaso right ankle high  compression 34* 10 min with elevation.     HOME EXERCISE PROGRAM: Access Code: [facility] URL: [email or web address] Date: 07/06/2023 Prepared by: Vladimir Faster  Exercises - Seated Heel Raise  - 1 x daily - 7 x weekly - 2 sets - 10 reps - 5 seconds hold - Seated Toe Raise  - 1 x daily - 7 x weekly - 2 sets - 10 reps - 5 seconds hold - Seated Table Hamstring Stretch  - 1 x daily - 7 x weekly - 1 sets - 3 reps - 30 seconds hold - Ankle Plantar Flexion with Resistance  - 1 x daily - 7 x weekly - 2 sets - 10 reps - 5 seconds hold - Seated Ankle Inversion with Resistance and Legs Crossed  - 1 x daily - 7 x weekly - 2 sets - 10 reps - 5 seconds hold - Long Sitting Ankle Eversion with Resistance  - 1 x daily - 7 x weekly - 2 sets - 10 reps - 5 seconds hold - Long Sitting Ankle Dorsiflexion with Anchored Resistance  - 1 x daily - 7 x weekly - 2 sets - 10 reps - 5 seconds hold - wide stance head motions eyes open  - 1 x daily - 7 x weekly - 1 sets - 10 reps - Feet Apart with Eyes Closed with Head Motions  - 1 x daily - 7 x weekly - 1 sets - 10 reps - Wide stance on Foam Pad head movements  - 1 x daily - 7 x weekly - 1 sets - 10 reps - Heel Toe Raises with Counter Support  - 1 x daily - 7 x weekly - 2 sets - 10 reps - 5  seconds hold - Standing Heel Raises  - 1 x daily - 7 x weekly - 2 sets - 10 reps - 5 seconds hold - standing calf stretch with forefoot on small step or brick  - 1 x daily - 7 x weekly - 1 sets - 3 reps - 30 seconds hold - Standing Soleus Stretch on Foam 1/2 Roll  - 1 x daily - 7 x weekly - 1 sets - 3 reps - 30 seconds hold -Standing inversion stretch standing on folded towel - 30 sec 3 reps 1-2x/day  -Standing eversion stretch standing on folded towel - 30 sec 3 reps 1-2x/day     ASSESSMENT: CLINICAL IMPRESSION: Patient is slowly progressing with balance, gait and standing activities. His ankle has been slow to heal so PT has been limited in progressing activities.  He got clearance on 07/04/2023 to begin standing & gait with ASO brace.  Patient appears to understand updated HEP.  He continues to benefit from skilled PT.   OBJECTIVE IMPAIRMENTS: Abnormal gait, decreased activity tolerance, decreased balance, decreased endurance, decreased knowledge of condition, decreased knowledge of use of DME, decreased mobility, difficulty walking, decreased ROM, decreased strength, increased edema, impaired flexibility, postural dysfunction, and pain.   ACTIVITY LIMITATIONS: carrying, lifting, bending, standing, squatting, sleeping, stairs, transfers, and locomotion level  PARTICIPATION LIMITATIONS: meal prep, cleaning, laundry, driving, shopping, community activity, occupation, and yard work  PERSONAL FACTORS: Education, Fitness, Time since onset of injury/illness/exacerbation, and 1-2 comorbidities: see PMH  are also affecting patient's functional outcome.   REHAB POTENTIAL: Good  CLINICAL DECISION MAKING: Stable/uncomplicated  EVALUATION COMPLEXITY: Low   GOALS: Goals reviewed with patient? Yes  SHORT TERM GOALS: (target date for Short term goals 08/10/2023)   1.  Patient will demonstrate understanding of updated home exercise program to maintain progress from in clinic treatments.  Goal status: ongoing 07/06/2023  2. Patient ambulates >250' & negotiates ramps / curbs safely with cane & ASO on RLE  Goal status: ongoing 07/06/2023  LONG TERM GOALS: (target dates for all long term goals  07/12/2023 / on 07/06/2023 recert new LTG target date 09/28/2023)   1. Patient will demonstrate/report pain at worst less than or equal to 2/10 to facilitate minimal limitation in daily activity secondary to pain symptoms.  Goal status: ongoing 07/06/2023   2. Patient will demonstrate independent use of home exercise program to facilitate ability to maintain/progress functional gains from skilled physical therapy services.  Goal status: met to date but PT progressing, ongoing  07/06/2023   3. Patient will demonstrate FOTO outcome > or = 49 % to indicate reduced disability due to condition.  Goal status: MET 06/28/2023    4.  Patient will demonstrate right ankle LE MMT >3/5 throughout to faciltiate usual transfers, stairs, squatting at [facility] for daily life.   Goal status: ongoing 07/06/2023   5.  Patient ambulates without assistive device >500' and negotiates ramps, curbs and stairs single rail independently.  Goal status: ongoing 07/06/2023   6.  right ankle PROM dorsiflexion to neutral for standing activities.  Goal status: ongoing 07/06/2023   7.  patient demonstrates & verbalizes ability to perform work related tasks.  Goal Status: ongoing 07/06/2023  PLAN:  PT FREQUENCY:  1x/week  PT DURATION: 12 weeks  PLANNED INTERVENTIONS: Therapeutic exercises, Therapeutic activity, Neuro Muscular re-education, Balance training, Gait training, Patient/Family education, Joint mobilization, Stair training, DME instructions, Dry Needling, Electrical stimulation, Traction, Cryotherapy, vasopneumatic deviceMoist heat, Taping, Ultrasound, Ionotophoresis 4mg /ml Dexamethasone, and aquatic therapy, Manual therapy.  All included unless contraindicated  PLAN FOR NEXT SESSION: reduce frequency to 1x/wk with PT progressively updating HEP,  progress gait & exercise based on pain response    Vladimir Faster, PT, DPT 07/06/2023, 3:20 PM

## 2023-07-06 NOTE — Progress Notes (Signed)
Office Visit Note   Patient: Joel Bailey           Date of Birth: September 06, 1956           MRN: [id number] Visit Date: 07/04/2023              Requested by: No referring provider defined for this encounter. PCP: Pcp, No  Chief Complaint  Patient presents with   Right Ankle - Follow-up    12/23/2022 tib-cal fusion      HPI: Patient is a 67 year old gentleman is 63-month status post right tibial calcaneal fusion he is currently in therapy weightbearing as tolerated in the fracture boot.  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  Assessment & Plan: Visit Diagnoses:  1. S/P ankle fusion     Plan: Advance his activities and continue physical therapy.  He will try to advance with an ASO and a sneaker.  Patient most likely will not be able to return to his previous level of high demand work.  Follow-Up Instructions: Return in about 4 weeks (around 08/01/2023).   Ortho Exam  Patient is alert, oriented, no adenopathy, well-dressed, normal affect, normal respiratory effort. Examination the fusion is stable there is no pain with attempted distraction.  Patient has pain on the plantar aspect of his foot there are no nodules there is no plantar fasciitis.  Imaging: No results found. No images are attached to the encounter.  Labs: No results found for: "HGBA1C", "ESRSEDRATE", "CRP", "LABURIC", "REPTSTATUS", "GRAMSTAIN", "CULT", "LABORGA"   No results found for: "ALBUMIN", "PREALBUMIN", "CBC"  No results found for: "MG" No results found for: "VD25OH"  No results found for: "PREALBUMIN"    Latest Ref Rng & Units 12/23/2022    7:25 AM 12/01/2017    1:26 PM 08/02/2017    2:48 PM  CBC EXTENDED  WBC 4.0 - 10.5 K/uL 13.7  16.0  11.8   RBC 4.22 - 5.81 MIL/uL 4.48  4.51  3.99   Hemoglobin 13.0 - 17.0 g/dL 24.4  01.0  27.2   HCT 39.0 - 52.0 % 42.9  43.3  38.1   Platelets 150 - 400 K/uL 245  277  241      There is no height or weight on  file to calculate BMI.  Orders:  Orders Placed This Encounter  Procedures   XR Ankle Complete Right   No orders of the defined types were placed in this encounter.    Procedures: No procedures performed  Clinical Data: No additional findings.  ROS:  All other systems negative, except as noted in the HPI. Review of Systems  Objective: Vital Signs: There were no vitals taken for this visit.  Specialty Comments:  No specialty comments available.  PMFS History: Patient Active Problem List   Diagnosis Date Noted   Unstable ankle, right 12/23/2022   Cavovarus deformity of foot, acquired, right 12/23/2022   Lumbar stenosis with neurogenic claudication 12/07/2017   HNP (herniated nucleus pulposus) with myelopathy, cervical 08/07/2017   Past Medical History:  Diagnosis Date   Arthritis     History reviewed. No pertinent family history.  Past Surgical History:  Procedure Laterality Date   ANTERIOR CERVICAL DECOMPRESSION/DISCECTOMY FUSION 4 LEVELS N/A 08/07/2017   Procedure: ANTERIOR CERVICAL DECOMPRESSION/DISCECTOMY FUSION CERVICAL 3- CERVICAL 4, CERVICAL 4 - CERVICAL 5, CERVICAL 5- CERVICAL 6, CERVICAL 6- CERVICAL 7;  Surgeon: Shirlean Kelly, MD;  Location: [facility] OR;  Service: Neurosurgery;  Laterality: N/A;   BACK SURGERY  2017   FOOT ARTHRODESIS Right 12/23/2022   Procedure: RIGHT TIBIOCALCANEAL FUSION;  Surgeon: Nadara Mustard, MD;  Location: [facility] OR;  Service: Orthopedics;  Laterality: Right;   Social History   Occupational History   Not on file  Tobacco Use   Smoking status: Every Day    Current packs/day: 1.00    Average packs/day: 1 pack/day for 52.0 years (52.0 ttl pk-yrs)    Types: Cigarettes   Smokeless tobacco: Never  Vaping Use   Vaping status: Never Used  Substance and Sexual Activity   Alcohol use: No   Drug use: No   Sexual activity: Not on file

## 2023-07-18 ENCOUNTER — Ambulatory Visit: Payer: BC Managed Care – PPO | Admitting: Physical Therapy

## 2023-07-18 ENCOUNTER — Encounter: Payer: Self-pay | Admitting: Physical Therapy

## 2023-07-18 DIAGNOSIS — R600 Localized edema: Secondary | ICD-10-CM

## 2023-07-18 DIAGNOSIS — R2689 Other abnormalities of gait and mobility: Secondary | ICD-10-CM

## 2023-07-18 DIAGNOSIS — M6281 Muscle weakness (generalized): Secondary | ICD-10-CM

## 2023-07-18 DIAGNOSIS — M25671 Stiffness of right ankle, not elsewhere classified: Secondary | ICD-10-CM

## 2023-07-18 DIAGNOSIS — R2681 Unsteadiness on feet: Secondary | ICD-10-CM | POA: Diagnosis not present

## 2023-07-18 DIAGNOSIS — M25571 Pain in right ankle and joints of right foot: Secondary | ICD-10-CM

## 2023-07-18 NOTE — Therapy (Signed)
OUTPATIENT PHYSICAL THERAPY LOWER EXTREMITY TREATMENT   Patient Name: Joel Bailey MRN: [id number] DOB:[birth year], 67 y.o., male Today's Date: 07/18/2023  END OF SESSION:  PT End of Session - 07/18/23 1301     Visit Number 14    Number of Visits 24    Date for PT Re-Evaluation 09/28/23    Authorization Type [employer] comm PPO    Authorization Time Period $45 COPAY, DED MET, OOP Remaining $4,058.48, 24 Visits PT/OT/CHIRO COMBINED 12/12/2022    Authorization - Visit Number 14    Authorization - Number of Visits 24    PT Start Time 1300    PT Stop Time 1339    PT Time Calculation (min) 39 min    Activity Tolerance Patient tolerated treatment well    Behavior During Therapy WFL for tasks assessed/performed                     Past Medical History:  Diagnosis Date   Arthritis    Past Surgical History:  Procedure Laterality Date   ANTERIOR CERVICAL DECOMPRESSION/DISCECTOMY FUSION 4 LEVELS N/A 08/07/2017   Procedure: ANTERIOR CERVICAL DECOMPRESSION/DISCECTOMY FUSION CERVICAL 3- CERVICAL 4, CERVICAL 4 - CERVICAL 5, CERVICAL 5- CERVICAL 6, CERVICAL 6- CERVICAL 7;  Surgeon: Shirlean Kelly, MD;  Location: [facility] OR;  Service: Neurosurgery;  Laterality: N/A;   BACK SURGERY  2017   FOOT ARTHRODESIS Right 12/23/2022   Procedure: RIGHT TIBIOCALCANEAL FUSION;  Surgeon: Nadara Mustard, MD;  Location: [facility] OR;  Service: Orthopedics;  Laterality: Right;   Patient Active Problem List   Diagnosis Date Noted   Unstable ankle, right 12/23/2022   Cavovarus deformity of foot, acquired, right 12/23/2022   Lumbar stenosis with neurogenic claudication 12/07/2017   HNP (herniated nucleus pulposus) with myelopathy, cervical 08/07/2017    PCP: No PCP  REFERRING PROVIDER: Aldean Baker, MD  REFERRING DIAG: Z98.1 (ICD-10-CM) - S/P ankle fusion   THERAPY DIAG:  Stiffness of right ankle, not elsewhere classified  Localized edema  Muscle weakness (generalized)  Unsteadiness on feet  Other  abnormalities of gait and mobility  Pain in right ankle and joints of right foot  Rationale for Evaluation and Treatment: Rehabilitation  ONSET DATE: 03/28/2023 MD visit with PT referral / change in WB status  SUBJECTIVE:   SUBJECTIVE STATEMENT: He still can not get the ASO brace on with his shoe. The beach vacation went well.   PERTINENT HISTORY: Arthritis, cervical fusion sg, lumbar fusion sg  PAIN:  NPRS scale: today sitting with foot on floor 3-4/10 & walking 6-7/10 and over the last week lowest 2/10 & highest 9/10 Pain location: right ankle more in joint Pain description: sitting aches & walking sharp Aggravating factors: walking & standing Relieving factors: sit to rest with foot off floor (recliner or on pillows) & elevation  PRECAUTIONS: Fall  WEIGHT BEARING RESTRICTIONS: Yes RLE WBAT with CAM boot  FALLS:  Has patient fallen in last 6 months? No  LIVING ENVIRONMENT: Lives with: lives with their partner and dog 4# Lives in: Mobile home Stairs: Yes: External: 1 steps; none threshold into home from deck.  Ramp up to deck.  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 Lifting up to 50-60#, push / pull.   PLOF: Independent  PATIENT GOALS:  get back to work, walk in community, yard work   Next MD visit: 7/23/20924  OBJECTIVE:  DIAGNOSTIC FINDINGS:  06/08/2023 Three-view radiographs of the right ankle shows interval bony healing.  There has been some loosening of the screws.   03/28/2023 Three-view radiographs of the right ankle shows backing out of the hardware from the tibial calcaneal fusion.  With persistent lucency of the tibial talar joint.   PATIENT SURVEYS:  06/28/2023  visit 11 FOTO 52%  Eval / 04/11/2023:   FOTO intake:  13%  predicted:  49%  COGNITION: Overall cognitive status: WFL    SENSATION: Eval / 04/11/2023:    Light  touch: Impaired  Proprioception: Impaired   EDEMA:  05/01/2023: LLE: above ankle 21.5 cm,  around ankle 32 cm, below ankle 26.5 cm RLE: above ankle 23.5cm,  around ankle 34.5 cm, below ankle 28.5 cm  Eval / 04/11/2023:    RLE: above ankle 25.4cm,  around ankle 36.8 cm, below ankle 28.5 cm LLE: above ankle 21.8cm,  around ankle 32 cm, below ankle 27 cm   POSTURE: Eval / 04/11/2023:    rounded shoulders, forward head, flexed trunk , and weight shift left  PALPATION: Eval / 04/11/2023:   No open areas, tenderness along ankle joint line, tenderness Achille's Tendon.  LOWER EXTREMITY ROM:   ROM P:passive A:active Right Eval 04/11/23 Right 05/01/23 Right 06/12/23 Right 06/21/23 Right 06/26/23 Right 06/28/23  Hip flexion        Hip extension        Hip abduction        Hip adduction        Hip internal rotation        Hip external rotation        Knee flexion        Knee extension        Ankle dorsiflexion Seated P: -15* A: -18* Seated P: -5* A: -7* Seated Knee ext A: -14* P:-11* Knee flex A: -9* P: -7* Seated Knee flex A: -8* P: -6* Seated Knee ext A: -10* P:-7* Knee flex A: -6* P: -5* Seated Knee ext A: -10* P:-7* Knee flex A: -6* P: -5*  Ankle plantarflexion Seated  P: 20* Seated  P: 28* A:  24*    Seated P: 33* A: 27*  Ankle inversion        Ankle eversion         (Blank rows = not tested)  LOWER EXTREMITY MMT:  MMT Right Eval 04/11/23  Hip flexion   Hip extension   Hip abduction   Hip adduction   Hip internal rotation   Hip external rotation   Knee flexion   Knee extension   Ankle dorsiflexion 2-/5  Ankle plantarflexion 2-/5  Ankle inversion 2-/5  Ankle eversion 2-/5   (Blank rows = not tested)  GAIT: 07/06/2023: pt amb with cane >200' modified independent.   06/28/2023: pt amb with cane & CAM boot >200' and neg ramp/curb modified independent.   04/19/2023: pt amb >200' with CAM boot & crutches WBAT safely.   Eval / 04/11/2023:   Distance  walked: 100' Assistive device utilized: Crutches and CAM boot Level of assistance: SBA Comments: WBAT with antalgic pattern significant decrease RLE stance duration.    TODAY'S TREATMENT  DATE: 07/18/2023:  Therapeutic Exercise: PT added stepping with green Theraband hip abd, ext, add & flex to HEP with HO, demo & verbal cues. Pt return demo with cane support 10 reps BLEs and verbalized understanding. Pt going to purchase sandal with 3 or more straps to use with ASO.  PT instructed pt in progressive walking program with directions initially 1x/day and progressing only when can do one level for 2 days without increased pain or fatigue. Pt verbalized understanding.   Level 1 walk 5 min, rest 5 min, 3 sets  2. Walk 6, rest 4, 3 sets  3. Walk 7, rest 3, 3 sets  4. Walk 8, rest 2, 3 sets  5. Walk 10, rest 3, 2 sets  6. Walk 10, rest 3, 3 sets  7. Walk 10, rest 2, 3 sets  8. Walk 10, rest 1, 3 sets  9. Walk 30 min 1x/day Add 2x/day, 3x/day ... Until walking as much as work requires.    07/06/2023: Therapeutic Exercise: PT updated & reviewed HEP with HO, demo & verbal cues (see below). Pt return demo understanding.   07/04/2023: Therapeutic Exercise: Nustep seat 12 level 5 with BLEs only (RLE CAM boot - forgot shoe) 10 min. Seated no CAM boot, BAPS level 2 10 reps ea 2 sets - DF/PF, inv / ever, circles CW & CCW.   Self-Care: PT demo & verbal cues on donning ASO. Pt verbalized and return demo understanding.  PT recommended standing 10 min per hour for 10+ hours each day.  Once he can do 2 days without increase in pain or edema, then increase to 15 min per hour.  Goal is to build up to standing to most of work hours. Pt verbalized understanding.  Neuromuscular Re-education Balance facilitating ankle strategy: sock footed on foam hip width in corner with chair in front 10 reps ea eyes open then eyes closed head  movements right/left, up/down & diagonals. sock footed on foam hip width in corner with chair in front: ankle DF with posterior pelvic weight shift 15 reps & lateral weight shifts 15 reps   Vaso right ankle high compression 34* 10 min with elevation.     HOME EXERCISE PROGRAM: Access Code: [facility] URL: [email or web address] Date: 07/18/2023 Prepared by: Vladimir Faster  Exercises - Seated Heel Raise  - 1 x daily - 7 x weekly - 2 sets - 10 reps - 5 seconds hold - Seated Toe Raise  - 1 x daily - 7 x weekly - 2 sets - 10 reps - 5 seconds hold - Seated Table Hamstring Stretch  - 1 x daily - 7 x weekly - 1 sets - 3 reps - 30 seconds hold - Ankle Plantar Flexion with Resistance  - 1 x daily - 7 x weekly - 2 sets - 10 reps - 5 seconds hold - Seated Ankle Inversion with Resistance and Legs Crossed  - 1 x daily - 7 x weekly - 2 sets - 10 reps - 5 seconds hold - Long Sitting Ankle Eversion with Resistance  - 1 x daily - 7 x weekly - 2 sets - 10 reps - 5 seconds hold - Long Sitting Ankle Dorsiflexion with Anchored Resistance  - 1 x daily - 7 x weekly - 2 sets - 10 reps - 5 seconds hold - wide stance head motions eyes open  - 1 x daily - 7 x weekly - 1 sets - 10 reps - Feet Apart with Eyes Closed with Head Motions  -  1 x daily - 7 x weekly - 1 sets - 10 reps - Wide stance on Foam Pad head movements  - 1 x daily - 7 x weekly - 1 sets - 10 reps - Heel Toe Raises with Counter Support  - 1 x daily - 7 x weekly - 2 sets - 10 reps - 5 seconds hold - Standing Heel Raises  - 1 x daily - 7 x weekly - 2 sets - 10 reps - 5 seconds hold - standing calf stretch with forefoot on small step or brick  - 1 x daily - 7 x weekly - 1 sets - 3 reps - 30 seconds hold - Standing Soleus Stretch on Foam 1/2 Roll  - 1 x daily - 7 x weekly - 1 sets - 3 reps - 30 seconds hold - Standing Hip Flexion with Resistance (Mirrored)  - 1 x daily - 7 x weekly - 1 sets - 10 reps - Standing Hip Adduction with Resistance  (Mirrored)  - 1 x daily - 7 x weekly - 1 sets - 10 reps - Standing Hip Extension with Resistance  - 1 x daily - 7 x weekly - 1 sets - 10 reps - Standing Hip Abduction with Theraband Resistance  - 1 x daily - 7 x weekly - 1 sets - 10 reps    ASSESSMENT: CLINICAL IMPRESSION: Patient appears to understand updated HEP including walking program.   OBJECTIVE IMPAIRMENTS: Abnormal gait, decreased activity tolerance, decreased balance, decreased endurance, decreased knowledge of condition, decreased knowledge of use of DME, decreased mobility, difficulty walking, decreased ROM, decreased strength, increased edema, impaired flexibility, postural dysfunction, and pain.   ACTIVITY LIMITATIONS: carrying, lifting, bending, standing, squatting, sleeping, stairs, transfers, and locomotion level  PARTICIPATION LIMITATIONS: meal prep, cleaning, laundry, driving, shopping, community activity, occupation, and yard work  PERSONAL FACTORS: Education, Fitness, Time since onset of injury/illness/exacerbation, and 1-2 comorbidities: see PMH  are also affecting patient's functional outcome.   REHAB POTENTIAL: Good  CLINICAL DECISION MAKING: Stable/uncomplicated  EVALUATION COMPLEXITY: Low   GOALS: Goals reviewed with patient? Yes  SHORT TERM GOALS: (target date for Short term goals 08/10/2023)   1.  Patient will demonstrate understanding of updated home exercise program to maintain progress from in clinic treatments.  Goal status: ongoing 07/06/2023  2. Patient ambulates >250' & negotiates ramps / curbs safely with cane & ASO on RLE  Goal status: ongoing 07/06/2023  LONG TERM GOALS: (target dates for all long term goals  07/12/2023 / on 07/06/2023 recert new LTG target date 09/28/2023)   1. Patient will demonstrate/report pain at worst less than or equal to 2/10 to facilitate minimal limitation in daily activity secondary to pain symptoms.  Goal status: ongoing 07/06/2023   2. Patient will demonstrate  independent use of home exercise program to facilitate ability to maintain/progress functional gains from skilled physical therapy services.  Goal status: met to date but PT progressing, ongoing 07/06/2023   3. Patient will demonstrate FOTO outcome > or = 49 % to indicate reduced disability due to condition.  Goal status: MET 06/28/2023    4.  Patient will demonstrate right ankle LE MMT >3/5 throughout to faciltiate usual transfers, stairs, squatting at [facility] for daily life.   Goal status: ongoing 07/06/2023   5.  Patient ambulates without assistive device >500' and negotiates ramps, curbs and stairs single rail independently.  Goal status: ongoing 07/06/2023   6.  right ankle PROM dorsiflexion to neutral for standing activities.  Goal  status: ongoing 07/06/2023   7.  patient demonstrates & verbalizes ability to perform work related tasks.  Goal Status: ongoing 07/06/2023  PLAN:  PT FREQUENCY:  1x/week  PT DURATION: 12 weeks  PLANNED INTERVENTIONS: Therapeutic exercises, Therapeutic activity, Neuro Muscular re-education, Balance training, Gait training, Patient/Family education, Joint mobilization, Stair training, DME instructions, Dry Needling, Electrical stimulation, Traction, Cryotherapy, vasopneumatic deviceMoist heat, Taping, Ultrasound, Ionotophoresis 4mg /ml Dexamethasone, and aquatic therapy, Manual therapy.  All included unless contraindicated  PLAN FOR NEXT SESSION: check updated HEP including walking program,  progress gait & exercise based on pain response    Vladimir Faster, PT, DPT 07/18/2023, 2:07 PM

## 2023-07-25 ENCOUNTER — Ambulatory Visit: Payer: BC Managed Care – PPO | Admitting: Physical Therapy

## 2023-07-25 ENCOUNTER — Encounter: Payer: Self-pay | Admitting: Physical Therapy

## 2023-07-25 DIAGNOSIS — M6281 Muscle weakness (generalized): Secondary | ICD-10-CM | POA: Diagnosis not present

## 2023-07-25 DIAGNOSIS — M25671 Stiffness of right ankle, not elsewhere classified: Secondary | ICD-10-CM | POA: Diagnosis not present

## 2023-07-25 DIAGNOSIS — R600 Localized edema: Secondary | ICD-10-CM

## 2023-07-25 DIAGNOSIS — R2681 Unsteadiness on feet: Secondary | ICD-10-CM | POA: Diagnosis not present

## 2023-07-25 DIAGNOSIS — M25571 Pain in right ankle and joints of right foot: Secondary | ICD-10-CM

## 2023-07-25 DIAGNOSIS — R2689 Other abnormalities of gait and mobility: Secondary | ICD-10-CM

## 2023-07-25 NOTE — Patient Instructions (Signed)
Try to walk with brace & sandal building up how long / far you can go.    Level 1 walk 5 min, rest 5 min, 3 sets  2. Walk 6, rest 4, 3 sets  3. Walk 7, rest 3, 3 sets  4. Walk 8, rest 2, 3 sets  5. Walk 10, rest 3, 2 sets  6. Walk 10, rest 3, 3 sets  7. Walk 10, rest 2, 3 sets  8. Walk 10, rest 1, 3 sets  9. Walk 30 min 1x/day Add 2x/day, 3x/day ... Until walking as much as work requires.  Use brace & sandal to do band kicks. 4" block step up & step down 10 times both hands, 10 times left hand support & 10 times right hand support.  Side step to your right on 4" block step up & step down 10 times both hands, 10 times left hand support & 10 times right hand support.  Squat lift 10# 10x, 15# 10x, 20# box 10x Stairs with one rail & cane alternating leading legs going down and alternate steps coming up.

## 2023-07-25 NOTE — Therapy (Signed)
OUTPATIENT PHYSICAL THERAPY LOWER EXTREMITY TREATMENT & PROGRESS NOTE   Patient Name: LAMAR SEPPI MRN: [id number] DOB:01-10-1956, 67 y.o., male Today's Date: 07/25/2023  Progress Note Reporting Period 06/28/2023 to 07/25/2023  See note below for Objective Data and Assessment of Progress/Goals.      END OF SESSION:  PT End of Session - 07/25/23 1254     Visit Number 15    Number of Visits 24    Date for PT Re-Evaluation 09/28/23    Authorization Type [employer] comm PPO    Authorization Time Period $45 COPAY, DED MET, OOP Remaining $4,058.48, 24 Visits PT/OT/CHIRO COMBINED 12/12/2022    Authorization - Visit Number 15    Authorization - Number of Visits 24    PT Start Time 1300    PT Stop Time 1339    PT Time Calculation (min) 39 min    Activity Tolerance Patient tolerated treatment well    Behavior During Therapy WFL for tasks assessed/performed                      Past Medical History:  Diagnosis Date   Arthritis    Past Surgical History:  Procedure Laterality Date   ANTERIOR CERVICAL DECOMPRESSION/DISCECTOMY FUSION 4 LEVELS N/A 08/07/2017   Procedure: ANTERIOR CERVICAL DECOMPRESSION/DISCECTOMY FUSION CERVICAL 3- CERVICAL 4, CERVICAL 4 - CERVICAL 5, CERVICAL 5- CERVICAL 6, CERVICAL 6- CERVICAL 7;  Surgeon: Shirlean Kelly, MD;  Location: [facility] OR;  Service: Neurosurgery;  Laterality: N/A;   BACK SURGERY  2017   FOOT ARTHRODESIS Right 12/23/2022   Procedure: RIGHT TIBIOCALCANEAL FUSION;  Surgeon: Nadara Mustard, MD;  Location: [facility] OR;  Service: Orthopedics;  Laterality: Right;   Patient Active Problem List   Diagnosis Date Noted   Unstable ankle, right 12/23/2022   Cavovarus deformity of foot, acquired, right 12/23/2022   Lumbar stenosis with neurogenic claudication 12/07/2017   HNP (herniated nucleus pulposus) with myelopathy, cervical 08/07/2017    PCP: No PCP  REFERRING PROVIDER: Aldean Baker, MD  REFERRING DIAG: Z98.1 (ICD-10-CM) - S/P ankle fusion    THERAPY DIAG:  Stiffness of right ankle, not elsewhere classified  Localized edema  Muscle weakness (generalized)  Unsteadiness on feet  Other abnormalities of gait and mobility  Pain in right ankle and joints of right foot  Rationale for Evaluation and Treatment: Rehabilitation  ONSET DATE: 03/28/2023 MD visit with PT referral / change in WB status  SUBJECTIVE:   SUBJECTIVE STATEMENT: sandal with velcro closures would not close around back of ankle. 3 days ago he bought a sandal with elastic to tighten along top and was able to get in on over the ASO.  His leg feels more stable with boot and can walk 20 min with it but with ASO only ~5 min.  The exercises are going well    PERTINENT HISTORY: Arthritis, cervical fusion sg, lumbar fusion sg  PAIN:  NPRS scale: today sitting with foot on floor 3-4/10 & walking 6-7/10 and over the last week lowest 2/10 & highest 9/10 Pain location: right ankle more in joint Pain description: sitting aches & walking sharp Aggravating factors: walking & standing Relieving factors: sit to rest with foot off floor (recliner or on pillows) & elevation  PRECAUTIONS: Fall  WEIGHT BEARING RESTRICTIONS: Yes RLE WBAT with CAM boot  FALLS:  Has patient fallen in last 6 months? No  LIVING ENVIRONMENT: Lives with: lives with their partner and dog 4# Lives in: Mobile home Stairs: Yes: External: 1 steps;  none threshold into home from deck.  Ramp up to deck.  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 Lifting up to 50-60#, push / pull.   PLOF: Independent  PATIENT GOALS:  get back to work, walk in community, yard work   Next MD visit: 7/23/20924  OBJECTIVE:  DIAGNOSTIC FINDINGS:  06/08/2023 Three-view radiographs of the right ankle shows interval bony healing.  There has been some loosening of the screws.   03/28/2023  Three-view radiographs of the right ankle shows backing out of the hardware from the tibial calcaneal fusion.  With persistent lucency of the tibial talar joint.   PATIENT SURVEYS:  06/28/2023  visit 11 FOTO 52%  Eval / 04/11/2023:   FOTO intake:  13%  predicted:  49%  COGNITION: Overall cognitive status: WFL    SENSATION: Eval / 04/11/2023:    Light touch: Impaired  Proprioception: Impaired   EDEMA:  05/01/2023: LLE: above ankle 21.5 cm,  around ankle 32 cm, below ankle 26.5 cm RLE: above ankle 23.5cm,  around ankle 34.5 cm, below ankle 28.5 cm  Eval / 04/11/2023:    RLE: above ankle 25.4cm,  around ankle 36.8 cm, below ankle 28.5 cm LLE: above ankle 21.8cm,  around ankle 32 cm, below ankle 27 cm   POSTURE: Eval / 04/11/2023:    rounded shoulders, forward head, flexed trunk , and weight shift left  PALPATION: Eval / 04/11/2023:   No open areas, tenderness along ankle joint line, tenderness Achille's Tendon.  LOWER EXTREMITY ROM:   ROM P:passive A:active Right Eval 04/11/23 Right 05/01/23 Right 06/12/23 Right 06/21/23 Right 06/26/23 Right 06/28/23  Hip flexion        Hip extension        Hip abduction        Hip adduction        Hip internal rotation        Hip external rotation        Knee flexion        Knee extension        Ankle dorsiflexion Seated P: -15* A: -18* Seated P: -5* A: -7* Seated Knee ext A: -14* P:-11* Knee flex A: -9* P: -7* Seated Knee flex A: -8* P: -6* Seated Knee ext A: -10* P:-7* Knee flex A: -6* P: -5* Seated Knee ext A: -10* P:-7* Knee flex A: -6* P: -5*  Ankle plantarflexion Seated  P: 20* Seated  P: 28* A:  24*    Seated P: 33* A: 27*  Ankle inversion        Ankle eversion         (Blank rows = not tested)  LOWER EXTREMITY MMT:  MMT Right Eval 04/11/23  Hip flexion   Hip extension   Hip abduction   Hip adduction   Hip internal rotation   Hip external rotation   Knee flexion   Knee extension   Ankle  dorsiflexion 2-/5  Ankle plantarflexion 2-/5  Ankle inversion 2-/5  Ankle eversion 2-/5   (Blank rows = not tested)  GAIT: 07/06/2023: pt amb with cane >200' modified independent.   06/28/2023: pt amb with cane & CAM boot >200' and neg ramp/curb modified independent.   04/19/2023: pt amb >200' with CAM boot & crutches WBAT safely.   Eval / 04/11/2023:   Distance walked: 100' Assistive device utilized: Crutches and CAM boot Level of  assistance: SBA Comments: WBAT with antalgic pattern significant decrease RLE stance duration.    TODAY'S TREATMENT                                                                          DATE: 07/25/2023: All exercises & activities with ASO / sandal on RLE Therapeutic Exercise: Bike seat 6 level 3 for 8 min Step up & down using RLE 4" step 10 reps with BUE support, 10 reps with LUE & 10 reps RUE support. Side step up/down using RLE 4" step 10 reps with BUE support, 10 reps with LUE & 10 reps RUE support. PT demo & verbal cues on squat lift with wide stance RLE offset forward to compensate for ankle ROM. 10 reps with 10# kettle bell, 10 reps with 15# kettle bell, 10 reps with 18# box  Pt amb 80' with cane carrying 10# kettle bell. Pt neg 11 steps with single rail (switching sides half way) and cane descend step-to pattern alternating lead LE and ascend alternating pattern.  Pt verbal, demo & HO cues for above as HEP. Pt verbalized understanding.    07/18/2023: Therapeutic Exercise: PT added stepping with green Theraband hip abd, ext, add & flex to HEP with HO, demo & verbal cues. Pt return demo with cane support 10 reps BLEs and verbalized understanding. Pt going to purchase sandal with 3 or more straps to use with ASO.  PT instructed pt in progressive walking program with directions initially 1x/day and progressing only when can do one level for 2 days without increased pain or fatigue. Pt verbalized understanding.   Level 1 walk 5 min, rest 5 min, 3  sets  2. Walk 6, rest 4, 3 sets  3. Walk 7, rest 3, 3 sets  4. Walk 8, rest 2, 3 sets  5. Walk 10, rest 3, 2 sets  6. Walk 10, rest 3, 3 sets  7. Walk 10, rest 2, 3 sets  8. Walk 10, rest 1, 3 sets  9. Walk 30 min 1x/day Add 2x/day, 3x/day ... Until walking as much as work requires.    07/06/2023: Therapeutic Exercise: PT updated & reviewed HEP with HO, demo & verbal cues (see below). Pt return demo understanding.    HOME EXERCISE PROGRAM: Access Code: [facility] URL: [email or web address] Date: 07/18/2023 Prepared by: Vladimir Faster  Exercises - Seated Heel Raise  - 1 x daily - 7 x weekly - 2 sets - 10 reps - 5 seconds hold - Seated Toe Raise  - 1 x daily - 7 x weekly - 2 sets - 10 reps - 5 seconds hold - Seated Table Hamstring Stretch  - 1 x daily - 7 x weekly - 1 sets - 3 reps - 30 seconds hold - Ankle Plantar Flexion with Resistance  - 1 x daily - 7 x weekly - 2 sets - 10 reps - 5 seconds hold - Seated Ankle Inversion with Resistance and Legs Crossed  - 1 x daily - 7 x weekly - 2 sets - 10 reps - 5 seconds hold - Long Sitting Ankle Eversion with Resistance  - 1 x daily - 7 x weekly - 2 sets - 10 reps - 5 seconds hold - Long  Sitting Ankle Dorsiflexion with Anchored Resistance  - 1 x daily - 7 x weekly - 2 sets - 10 reps - 5 seconds hold - wide stance head motions eyes open  - 1 x daily - 7 x weekly - 1 sets - 10 reps - Feet Apart with Eyes Closed with Head Motions  - 1 x daily - 7 x weekly - 1 sets - 10 reps - Wide stance on Foam Pad head movements  - 1 x daily - 7 x weekly - 1 sets - 10 reps - Heel Toe Raises with Counter Support  - 1 x daily - 7 x weekly - 2 sets - 10 reps - 5 seconds hold - Standing Heel Raises  - 1 x daily - 7 x weekly - 2 sets - 10 reps - 5 seconds hold - standing calf stretch with forefoot on small step or brick  - 1 x daily - 7 x weekly - 1 sets - 3 reps - 30 seconds hold - Standing Soleus Stretch on Foam 1/2 Roll  - 1 x daily - 7 x weekly -  1 sets - 3 reps - 30 seconds hold - Standing Hip Flexion with Resistance (Mirrored)  - 1 x daily - 7 x weekly - 1 sets - 10 reps - Standing Hip Adduction with Resistance (Mirrored)  - 1 x daily - 7 x weekly - 1 sets - 10 reps - Standing Hip Extension with Resistance  - 1 x daily - 7 x weekly - 1 sets - 10 reps - Standing Hip Abduction with Theraband Resistance  - 1 x daily - 7 x weekly - 1 sets - 10 reps  Try to walk with brace & sandal building up how long / far you can go.    Level 1 walk 5 min, rest 5 min, 3 sets  2. Walk 6, rest 4, 3 sets  3. Walk 7, rest 3, 3 sets  4. Walk 8, rest 2, 3 sets  5. Walk 10, rest 3, 2 sets  6. Walk 10, rest 3, 3 sets  7. Walk 10, rest 2, 3 sets  8. Walk 10, rest 1, 3 sets  9. Walk 30 min 1x/day Add 2x/day, 3x/day ... Until walking as much as work requires.  Use brace & sandal to do band kicks. 4" block step up & step down 10 times both hands, 10 times left hand support & 10 times right hand support.  Side step to your right on 4" block step up & step down 10 times both hands, 10 times left hand support & 10 times right hand support.  Squat lift 10# 10x, 15# 10x, 20# box 10x Stairs with one rail & cane alternating leading legs going down and alternate steps coming up.  ASSESSMENT: CLINICAL IMPRESSION: Patient continues to have issues with pain in ankle with standing & walking activities.  However he is tolerating more activities without pain changing.  He appears to understand progressive activities that PT recommends weekly to build up strength & function to return to work.  Pt continues to benefit from skilled PT but frequency has been reduced to 1x/wk due to visit limit.    OBJECTIVE IMPAIRMENTS: Abnormal gait, decreased activity tolerance, decreased balance, decreased endurance, decreased knowledge of condition, decreased knowledge of use of DME, decreased mobility, difficulty walking, decreased ROM, decreased strength, increased edema, impaired  flexibility, postural dysfunction, and pain.   ACTIVITY LIMITATIONS: carrying, lifting, bending, standing, squatting, sleeping, stairs, transfers, and locomotion  level  PARTICIPATION LIMITATIONS: meal prep, cleaning, laundry, driving, shopping, community activity, occupation, and yard work  PERSONAL FACTORS: Education, Fitness, Time since onset of injury/illness/exacerbation, and 1-2 comorbidities: see PMH  are also affecting patient's functional outcome.   REHAB POTENTIAL: Good  CLINICAL DECISION MAKING: Stable/uncomplicated  EVALUATION COMPLEXITY: Low   GOALS: Goals reviewed with patient? Yes  SHORT TERM GOALS: (target date for Short term goals 08/10/2023)   1.  Patient will demonstrate understanding of updated home exercise program to maintain progress from in clinic treatments.  Goal status: ongoing 07/06/2023  2. Patient ambulates >250' & negotiates ramps / curbs safely with cane & ASO on RLE  Goal status: ongoing 07/06/2023  LONG TERM GOALS: (target dates for all long term goals  07/12/2023 / on 07/06/2023 recert new LTG target date 09/28/2023)   1. Patient will demonstrate/report pain at worst less than or equal to 2/10 to facilitate minimal limitation in daily activity secondary to pain symptoms.  Goal status: ongoing 07/06/2023   2. Patient will demonstrate independent use of home exercise program to facilitate ability to maintain/progress functional gains from skilled physical therapy services.  Goal status: met to date but PT progressing, ongoing 07/06/2023   3. Patient will demonstrate FOTO outcome > or = 49 % to indicate reduced disability due to condition.  Goal status: MET 06/28/2023    4.  Patient will demonstrate right ankle LE MMT >3/5 throughout to faciltiate usual transfers, stairs, squatting at [facility] for daily life.   Goal status: ongoing 07/06/2023   5.  Patient ambulates without assistive device >500' and negotiates ramps, curbs and stairs single rail  independently.  Goal status: ongoing 07/06/2023   6.  right ankle PROM dorsiflexion to neutral for standing activities.  Goal status: ongoing 07/06/2023   7.  patient demonstrates & verbalizes ability to perform work related tasks.  Goal Status: ongoing 07/06/2023  PLAN:  PT FREQUENCY:  1x/week  PT DURATION: 12 weeks  PLANNED INTERVENTIONS: Therapeutic exercises, Therapeutic activity, Neuro Muscular re-education, Balance training, Gait training, Patient/Family education, Joint mobilization, Stair training, DME instructions, Dry Needling, Electrical stimulation, Traction, Cryotherapy, vasopneumatic deviceMoist heat, Taping, Ultrasound, Ionotophoresis 4mg /ml Dexamethasone, and aquatic therapy, Manual therapy.  All included unless contraindicated  PLAN FOR NEXT SESSION: continue to progress gait & exercise based on pain response with instructions for HEP.     Vladimir Faster, PT, DPT 07/25/2023, 1:44 PM

## 2023-08-01 ENCOUNTER — Ambulatory Visit: Payer: BC Managed Care – PPO | Admitting: Orthopedic Surgery

## 2023-08-01 ENCOUNTER — Encounter: Payer: Self-pay | Admitting: Physical Therapy

## 2023-08-01 ENCOUNTER — Ambulatory Visit: Payer: BC Managed Care – PPO | Admitting: Physical Therapy

## 2023-08-01 DIAGNOSIS — Z981 Arthrodesis status: Secondary | ICD-10-CM

## 2023-08-01 DIAGNOSIS — M25671 Stiffness of right ankle, not elsewhere classified: Secondary | ICD-10-CM

## 2023-08-01 DIAGNOSIS — R600 Localized edema: Secondary | ICD-10-CM | POA: Diagnosis not present

## 2023-08-01 DIAGNOSIS — R2681 Unsteadiness on feet: Secondary | ICD-10-CM

## 2023-08-01 DIAGNOSIS — M6281 Muscle weakness (generalized): Secondary | ICD-10-CM | POA: Diagnosis not present

## 2023-08-01 DIAGNOSIS — M25571 Pain in right ankle and joints of right foot: Secondary | ICD-10-CM

## 2023-08-01 DIAGNOSIS — R2689 Other abnormalities of gait and mobility: Secondary | ICD-10-CM

## 2023-08-01 NOTE — Therapy (Addendum)
OUTPATIENT PHYSICAL THERAPY LOWER EXTREMITY TREATMENT  / DISCHARGE   Patient Name: Joel Bailey MRN: [id number] DOB:1956/05/03, 67 y.o., male Today's Date: 08/01/2023   END OF SESSION:  PT End of Session - 08/01/23 1433     Visit Number 16    Number of Visits 24    Date for PT Re-Evaluation 09/28/23    Authorization Type BCBS comm PPO    Authorization Time Period $45 COPAY, DED MET, OOP Remaining $4,058.48, 24 Visits PT/OT/CHIRO COMBINED 12/12/2022    Authorization - Visit Number 16    Authorization - Number of Visits 24    PT Start Time 1430    PT Stop Time 1500    PT Time Calculation (min) 30 min    Activity Tolerance Patient tolerated treatment well    Behavior During Therapy WFL for tasks assessed/performed                       Past Medical History:  Diagnosis Date   Arthritis    Past Surgical History:  Procedure Laterality Date   ANTERIOR CERVICAL DECOMPRESSION/DISCECTOMY FUSION 4 LEVELS N/A 08/07/2017   Procedure: ANTERIOR CERVICAL DECOMPRESSION/DISCECTOMY FUSION CERVICAL 3- CERVICAL 4, CERVICAL 4 - CERVICAL 5, CERVICAL 5- CERVICAL 6, CERVICAL 6- CERVICAL 7;  Surgeon: Shirlean Kelly, MD;  Location: [facility] OR;  Service: Neurosurgery;  Laterality: N/A;   BACK SURGERY  2017   FOOT ARTHRODESIS Right 12/23/2022   Procedure: RIGHT TIBIOCALCANEAL FUSION;  Surgeon: Nadara Mustard, MD;  Location: [facility] OR;  Service: Orthopedics;  Laterality: Right;   Patient Active Problem List   Diagnosis Date Noted   Unstable ankle, right 12/23/2022   Cavovarus deformity of foot, acquired, right 12/23/2022   Lumbar stenosis with neurogenic claudication 12/07/2017   HNP (herniated nucleus pulposus) with myelopathy, cervical 08/07/2017    PCP: No PCP  REFERRING PROVIDER: Aldean Baker, MD  REFERRING DIAG: Z98.1 (ICD-10-CM) - S/P ankle fusion   THERAPY DIAG:  Stiffness of right ankle, not elsewhere classified  Localized edema  Muscle weakness (generalized)  Unsteadiness  on feet  Other abnormalities of gait and mobility  Pain in right ankle and joints of right foot  Rationale for Evaluation and Treatment: Rehabilitation  ONSET DATE: 03/28/2023 MD visit with PT referral / change in WB status  SUBJECTIVE:   SUBJECTIVE STATEMENT: He tried to go back to [employer] to get a sandal one size up but did not have one.  His ankle pain spiked to 9/10 and stayed longer period.  So he has been using CAM boot.  He saw Dr. Lajoyce Corners just before PT and he recommended going to [facility] for AFO shoe with double upright insert.  Work is considering giving him a different job that does not require being on feet as much.  He is standing 30 min of each hour for ~8 hour day.  He is walking 15 min now.  He feels ankle swelling is same with standing / walking compared to sitting all day.  Walking on grass increases pain.    PERTINENT HISTORY: Arthritis, cervical fusion sg, lumbar fusion sg  PAIN:  NPRS scale: today sitting with foot on floor 3/10 & walking 7/10 and over the last week lowest 2/10 & highest 9/10 Pain location: right ankle more in joint Pain description: sitting aches & walking sharp Aggravating factors: walking & standing Relieving factors: sit to rest with foot off floor (recliner or on pillows) & elevation  PRECAUTIONS: Fall  WEIGHT BEARING RESTRICTIONS:  Yes RLE WBAT with CAM boot  FALLS:  Has patient fallen in last 6 months? No  LIVING ENVIRONMENT: Lives with: lives with their partner and dog 4# Lives in: Mobile home Stairs: Yes: External: 1 steps; none threshold into home from deck.  Ramp up to deck.  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 Lifting up to 50-60#, push / pull.   PLOF: Independent  PATIENT GOALS:  get back to work, walk in community, yard work   Next MD visit: 7/23/20924  OBJECTIVE:  DIAGNOSTIC FINDINGS:   06/08/2023 Three-view radiographs of the right ankle shows interval bony healing.  There has been some loosening of the screws.   03/28/2023 Three-view radiographs of the right ankle shows backing out of the hardware from the tibial calcaneal fusion.  With persistent lucency of the tibial talar joint.   PATIENT SURVEYS:  06/28/2023  visit 11 FOTO 52%  Eval / 04/11/2023:   FOTO intake:  13%  predicted:  49%  COGNITION: Overall cognitive status: WFL    SENSATION: Eval / 04/11/2023:    Light touch: Impaired  Proprioception: Impaired   EDEMA:  05/01/2023: LLE: above ankle 21.5 cm,  around ankle 32 cm, below ankle 26.5 cm RLE: above ankle 23.5cm,  around ankle 34.5 cm, below ankle 28.5 cm  Eval / 04/11/2023:    RLE: above ankle 25.4cm,  around ankle 36.8 cm, below ankle 28.5 cm LLE: above ankle 21.8cm,  around ankle 32 cm, below ankle 27 cm   POSTURE: Eval / 04/11/2023:    rounded shoulders, forward head, flexed trunk , and weight shift left  PALPATION: Eval / 04/11/2023:   No open areas, tenderness along ankle joint line, tenderness Achille's Tendon.  LOWER EXTREMITY ROM:   ROM P:passive A:active Right Eval 04/11/23 Right 05/01/23 Right 06/12/23 Right 06/21/23 Right 06/26/23 Right 06/28/23  Hip flexion        Hip extension        Hip abduction        Hip adduction        Hip internal rotation        Hip external rotation        Knee flexion        Knee extension        Ankle dorsiflexion Seated P: -15* A: -18* Seated P: -5* A: -7* Seated Knee ext A: -14* P:-11* Knee flex A: -9* P: -7* Seated Knee flex A: -8* P: -6* Seated Knee ext A: -10* P:-7* Knee flex A: -6* P: -5* Seated Knee ext A: -10* P:-7* Knee flex A: -6* P: -5*  Ankle plantarflexion Seated  P: 20* Seated  P: 28* A:  24*    Seated P: 33* A: 27*  Ankle inversion        Ankle eversion         (Blank rows = not tested)  LOWER EXTREMITY MMT:  MMT Right Eval 04/11/23  Hip flexion   Hip  extension   Hip abduction   Hip adduction   Hip internal rotation   Hip external rotation   Knee flexion   Knee extension   Ankle dorsiflexion 2-/5  Ankle plantarflexion 2-/5  Ankle inversion 2-/5  Ankle eversion 2-/5   (Blank rows = not tested)  GAIT: 07/06/2023: pt amb with cane >200' modified independent.   06/28/2023: pt amb with cane & CAM boot >  200' and neg ramp/curb modified independent.   04/19/2023: pt amb >200' with CAM boot & crutches WBAT safely.   Eval / 04/11/2023:   Distance walked: 100' Assistive device utilized: Crutches and CAM boot Level of assistance: SBA Comments: WBAT with antalgic pattern significant decrease RLE stance duration.    TODAY'S TREATMENT                                                                          DATE: 08/01/2023: Patient & PT had discussion about Dr. Lajoyce Corners recommendation for AFO and return to work.  Pt to discuss with work what requirements are required for shoe wear and if he can use double upright.  Then make sure that [facility] orthotist is aware of requirements.  Pt verbalized understanding.  PT instructed in descending stairs eccentric RLE with knee shifting forward over foot (not rotate around foot) with forefoot over the edge and knee flexion.  Pt return demo 11 steps with right rail and cane step-to pattern RLE eccentric.  PT demo & verbal cues on step-up exercise with RLE on bottom step and stepping LLE floor to/from 2nd step. Pt performed 10 reps. Pt ascend with right rail & LUE cane alternating pattern.    07/25/2023: All exercises & activities with ASO / sandal on RLE Therapeutic Exercise: Bike seat 6 level 3 for 8 min Step up & down using RLE 4" step 10 reps with BUE support, 10 reps with LUE & 10 reps RUE support. Side step up/down using RLE 4" step 10 reps with BUE support, 10 reps with LUE & 10 reps RUE support. PT demo & verbal cues on squat lift with wide stance RLE offset forward to compensate for ankle ROM. 10  reps with 10# kettle bell, 10 reps with 15# kettle bell, 10 reps with 18# box  Pt amb 80' with cane carrying 10# kettle bell. Pt neg 11 steps with single rail (switching sides half way) and cane descend step-to pattern alternating lead LE and ascend alternating pattern.  Pt verbal, demo & HO cues for above as HEP. Pt verbalized understanding.    07/18/2023: Therapeutic Exercise: PT added stepping with green Theraband hip abd, ext, add & flex to HEP with HO, demo & verbal cues. Pt return demo with cane support 10 reps BLEs and verbalized understanding. Pt going to purchase sandal with 3 or more straps to use with ASO.  PT instructed pt in progressive walking program with directions initially 1x/day and progressing only when can do one level for 2 days without increased pain or fatigue. Pt verbalized understanding.   Level 1 walk 5 min, rest 5 min, 3 sets  2. Walk 6, rest 4, 3 sets  3. Walk 7, rest 3, 3 sets  4. Walk 8, rest 2, 3 sets  5. Walk 10, rest 3, 2 sets  6. Walk 10, rest 3, 3 sets  7. Walk 10, rest 2, 3 sets  8. Walk 10, rest 1, 3 sets  9. Walk 30 min 1x/day Add 2x/day, 3x/day ... Until walking as much as work requires.    HOME EXERCISE PROGRAM: Access Code: [facility] URL: [email or web address] Date: 07/18/2023 Prepared by: Vladimir Faster  Exercises - Seated Heel Raise  -  1 x daily - 7 x weekly - 2 sets - 10 reps - 5 seconds hold - Seated Toe Raise  - 1 x daily - 7 x weekly - 2 sets - 10 reps - 5 seconds hold - Seated Table Hamstring Stretch  - 1 x daily - 7 x weekly - 1 sets - 3 reps - 30 seconds hold - Ankle Plantar Flexion with Resistance  - 1 x daily - 7 x weekly - 2 sets - 10 reps - 5 seconds hold - Seated Ankle Inversion with Resistance and Legs Crossed  - 1 x daily - 7 x weekly - 2 sets - 10 reps - 5 seconds hold - Long Sitting Ankle Eversion with Resistance  - 1 x daily - 7 x weekly - 2 sets - 10 reps - 5 seconds hold - Long Sitting Ankle Dorsiflexion with  Anchored Resistance  - 1 x daily - 7 x weekly - 2 sets - 10 reps - 5 seconds hold - wide stance head motions eyes open  - 1 x daily - 7 x weekly - 1 sets - 10 reps - Feet Apart with Eyes Closed with Head Motions  - 1 x daily - 7 x weekly - 1 sets - 10 reps - Wide stance on Foam Pad head movements  - 1 x daily - 7 x weekly - 1 sets - 10 reps - Heel Toe Raises with Counter Support  - 1 x daily - 7 x weekly - 2 sets - 10 reps - 5 seconds hold - Standing Heel Raises  - 1 x daily - 7 x weekly - 2 sets - 10 reps - 5 seconds hold - standing calf stretch with forefoot on small step or brick  - 1 x daily - 7 x weekly - 1 sets - 3 reps - 30 seconds hold - Standing Soleus Stretch on Foam 1/2 Roll  - 1 x daily - 7 x weekly - 1 sets - 3 reps - 30 seconds hold - Standing Hip Flexion with Resistance (Mirrored)  - 1 x daily - 7 x weekly - 1 sets - 10 reps - Standing Hip Adduction with Resistance (Mirrored)  - 1 x daily - 7 x weekly - 1 sets - 10 reps - Standing Hip Extension with Resistance  - 1 x daily - 7 x weekly - 1 sets - 10 reps - Standing Hip Abduction with Theraband Resistance  - 1 x daily - 7 x weekly - 1 sets - 10 reps  Try to walk with brace & sandal building up how long / far you can go.    Level 1 walk 5 min, rest 5 min, 3 sets  2. Walk 6, rest 4, 3 sets  3. Walk 7, rest 3, 3 sets  4. Walk 8, rest 2, 3 sets  5. Walk 10, rest 3, 2 sets  6. Walk 10, rest 3, 3 sets  7. Walk 10, rest 2, 3 sets  8. Walk 10, rest 1, 3 sets  9. Walk 30 min 1x/day Add 2x/day, 3x/day ... Until walking as much as work requires.  Use brace & sandal to do band kicks. 4" block step up & step down 10 times both hands, 10 times left hand support & 10 times right hand support.  Side step to your right on 4" block step up & step down 10 times both hands, 10 times left hand support & 10 times right hand support.  Squat lift 10#  10x, 15# 10x, 20# box 10x Stairs with one rail & cane alternating leading legs going down and  alternate steps coming up.  ASSESSMENT: CLINICAL IMPRESSION: PT needs to be held until he gets AFO as limited progression of activities.     OBJECTIVE IMPAIRMENTS: Abnormal gait, decreased activity tolerance, decreased balance, decreased endurance, decreased knowledge of condition, decreased knowledge of use of DME, decreased mobility, difficulty walking, decreased ROM, decreased strength, increased edema, impaired flexibility, postural dysfunction, and pain.   ACTIVITY LIMITATIONS: carrying, lifting, bending, standing, squatting, sleeping, stairs, transfers, and locomotion level  PARTICIPATION LIMITATIONS: meal prep, cleaning, laundry, driving, shopping, community activity, occupation, and yard work  PERSONAL FACTORS: Education, Fitness, Time since onset of injury/illness/exacerbation, and 1-2 comorbidities: see PMH  are also affecting patient's functional outcome.   REHAB POTENTIAL: Good  CLINICAL DECISION MAKING: Stable/uncomplicated  EVALUATION COMPLEXITY: Low   GOALS: Goals reviewed with patient? Yes  SHORT TERM GOALS: (target date for Short term goals 08/10/2023)   1.  Patient will demonstrate understanding of updated home exercise program to maintain progress from in clinic treatments.  Goal status: ongoing 07/06/2023  2. Patient ambulates >250' & negotiates ramps / curbs safely with cane & ASO on RLE  Goal status: ongoing 07/06/2023  LONG TERM GOALS: (target dates for all long term goals  07/12/2023 / on 07/06/2023 recert new LTG target date 09/28/2023)   1. Patient will demonstrate/report pain at worst less than or equal to 2/10 to facilitate minimal limitation in daily activity secondary to pain symptoms.  Goal status: ongoing 07/06/2023   2. Patient will demonstrate independent use of home exercise program to facilitate ability to maintain/progress functional gains from skilled physical therapy services.  Goal status: met to date but PT progressing, ongoing 07/06/2023    3. Patient will demonstrate FOTO outcome > or = 49 % to indicate reduced disability due to condition.  Goal status: MET 06/28/2023    4.  Patient will demonstrate right ankle LE MMT >3/5 throughout to faciltiate usual transfers, stairs, squatting at [facility] for daily life.   Goal status: ongoing 07/06/2023   5.  Patient ambulates without assistive device >500' and negotiates ramps, curbs and stairs single rail independently.  Goal status: ongoing 07/06/2023   6.  right ankle PROM dorsiflexion to neutral for standing activities.  Goal status: ongoing 07/06/2023   7.  patient demonstrates & verbalizes ability to perform work related tasks.  Goal Status: ongoing 07/06/2023  PLAN:  PT FREQUENCY:  1x/week  PT DURATION: 12 weeks  PLANNED INTERVENTIONS: Therapeutic exercises, Therapeutic activity, Neuro Muscular re-education, Balance training, Gait training, Patient/Family education, Joint mobilization, Stair training, DME instructions, Dry Needling, Electrical stimulation, Traction, Cryotherapy, vasopneumatic deviceMoist heat, Taping, Ultrasound, Ionotophoresis 4mg /ml Dexamethasone, and aquatic therapy, Manual therapy.  All included unless contraindicated  PLAN FOR NEXT SESSION: hold PT until he gets AFO, then reassess LTGs and update HEP    Vladimir Faster, PT, DPT 08/01/2023, 3:15 PM   PHYSICAL THERAPY DISCHARGE SUMMARY  Visits from Start of Care: 16  Current functional level related to goals / functional outcomes: See note   Remaining deficits: See note   Education / Equipment: HEP  Patient goals were partially met. Patient is being discharged due to not returning since the last visit.  Chyrel Masson, PT, DPT, OCS, ATC 09/07/23  2:08 PM

## 2023-08-08 ENCOUNTER — Encounter: Payer: BC Managed Care – PPO | Admitting: Physical Therapy

## 2023-08-11 ENCOUNTER — Encounter: Payer: Self-pay | Admitting: Orthopedic Surgery

## 2023-08-11 NOTE — Progress Notes (Signed)
Office Visit Note   Patient: Joel Bailey           Date of Birth: [birth year]           MRN: [id number] Visit Date: 08/01/2023              Requested by: No referring provider defined for this encounter. PCP: Pcp, No  Chief Complaint  Patient presents with   Right Ankle - Routine Post Op    12/23/2022 tib-cal fusion      HPI: Patient is a 67 year old gentleman who is 7 months status post right tibial calcaneal fusion.  Assessment & Plan: Visit Diagnoses:  1. S/P ankle fusion     Plan: Patient will follow-up with [facility] for physical therapy gait training and strengthening.  A prescription was provided for [employer] for a double upright brace orthotic and shoe.  Follow-Up Instructions: Return in about 4 weeks (around 08/29/2023).   Ortho Exam  Patient is alert, oriented, no adenopathy, well-dressed, normal affect, normal respiratory effort. Examination patient's foot is plantigrade there is no redness no cellulitis no signs of infection.  Imaging: No results found. No images are attached to the encounter.  Labs: No results found for: "HGBA1C", "ESRSEDRATE", "CRP", "LABURIC", "REPTSTATUS", "GRAMSTAIN", "CULT", "LABORGA"   No results found for: "ALBUMIN", "PREALBUMIN", "CBC"  No results found for: "MG" No results found for: "VD25OH"  No results found for: "PREALBUMIN"    Latest Ref Rng & Units 12/23/2022    7:25 AM 12/01/2017    1:26 PM 08/02/2017    2:48 PM  CBC EXTENDED  WBC 4.0 - 10.5 K/uL 13.7  16.0  11.8   RBC 4.22 - 5.81 MIL/uL 4.48  4.51  3.99   Hemoglobin 13.0 - 17.0 g/dL 78.2  95.6  21.3   HCT 39.0 - 52.0 % 42.9  43.3  38.1   Platelets 150 - 400 K/uL 245  277  241      There is no height or weight on file to calculate BMI.  Orders:  No orders of the defined types were placed in this encounter.  No orders of the defined types were placed in this encounter.    Procedures: No procedures performed  Clinical Data: No additional  findings.  ROS:  All other systems negative, except as noted in the HPI. Review of Systems  Objective: Vital Signs: There were no vitals taken for this visit.  Specialty Comments:  No specialty comments available.  PMFS History: Patient Active Problem List   Diagnosis Date Noted   Unstable ankle, right 12/23/2022   Cavovarus deformity of foot, acquired, right 12/23/2022   Lumbar stenosis with neurogenic claudication 12/07/2017   HNP (herniated nucleus pulposus) with myelopathy, cervical 08/07/2017   Past Medical History:  Diagnosis Date   Arthritis     History reviewed. No pertinent family history.  Past Surgical History:  Procedure Laterality Date   ANTERIOR CERVICAL DECOMPRESSION/DISCECTOMY FUSION 4 LEVELS N/A 08/07/2017   Procedure: ANTERIOR CERVICAL DECOMPRESSION/DISCECTOMY FUSION CERVICAL 3- CERVICAL 4, CERVICAL 4 - CERVICAL 5, CERVICAL 5- CERVICAL 6, CERVICAL 6- CERVICAL 7;  Surgeon: Shirlean Kelly, MD;  Location: [facility] OR;  Service: Neurosurgery;  Laterality: N/A;   BACK SURGERY  2017   FOOT ARTHRODESIS Right 12/23/2022   Procedure: RIGHT TIBIOCALCANEAL FUSION;  Surgeon: Nadara Mustard, MD;  Location: [facility] OR;  Service: Orthopedics;  Laterality: Right;   Social History   Occupational History   Not on file  Tobacco Use   Smoking status:  Every Day    Current packs/day: 1.00    Average packs/day: 1 pack/day for 52.0 years (52.0 ttl pk-yrs)    Types: Cigarettes   Smokeless tobacco: Never  Vaping Use   Vaping status: Never Used  Substance and Sexual Activity   Alcohol use: No   Drug use: No   Sexual activity: Not on file

## 2023-08-22 DIAGNOSIS — Z125 Encounter for screening for malignant neoplasm of prostate: Secondary | ICD-10-CM | POA: Diagnosis not present

## 2023-08-22 DIAGNOSIS — R030 Elevated blood-pressure reading, without diagnosis of hypertension: Secondary | ICD-10-CM | POA: Diagnosis not present

## 2023-08-22 DIAGNOSIS — Z13220 Encounter for screening for lipoid disorders: Secondary | ICD-10-CM | POA: Diagnosis not present

## 2023-08-22 DIAGNOSIS — I739 Peripheral vascular disease, unspecified: Secondary | ICD-10-CM | POA: Diagnosis not present

## 2023-08-22 DIAGNOSIS — Z0000 Encounter for general adult medical examination without abnormal findings: Secondary | ICD-10-CM | POA: Diagnosis not present

## 2023-08-29 ENCOUNTER — Other Ambulatory Visit: Payer: Self-pay | Admitting: Family Medicine

## 2023-08-29 DIAGNOSIS — Z720 Tobacco use: Secondary | ICD-10-CM

## 2023-08-31 ENCOUNTER — Ambulatory Visit: Payer: BC Managed Care – PPO | Admitting: Orthopedic Surgery

## 2023-09-08 ENCOUNTER — Ambulatory Visit
Admission: RE | Admit: 2023-09-08 | Discharge: 2023-09-08 | Disposition: A | Discharge status: Home / Self Care | Payer: BC Managed Care – PPO | Source: Ambulatory Visit | Attending: Family Medicine | Admitting: Family Medicine

## 2023-09-08 DIAGNOSIS — Z720 Tobacco use: Secondary | ICD-10-CM

## 2023-09-08 DIAGNOSIS — F17210 Nicotine dependence, cigarettes, uncomplicated: Secondary | ICD-10-CM | POA: Diagnosis not present

## 2023-09-11 ENCOUNTER — Ambulatory Visit: Payer: BC Managed Care – PPO | Admitting: Orthopedic Surgery

## 2023-09-19 DIAGNOSIS — Z1212 Encounter for screening for malignant neoplasm of rectum: Secondary | ICD-10-CM | POA: Diagnosis not present

## 2023-09-19 DIAGNOSIS — Z1211 Encounter for screening for malignant neoplasm of colon: Secondary | ICD-10-CM | POA: Diagnosis not present

## 2023-09-20 DIAGNOSIS — M21371 Foot drop, right foot: Secondary | ICD-10-CM | POA: Diagnosis not present

## 2023-09-28 ENCOUNTER — Other Ambulatory Visit: Payer: Self-pay | Admitting: *Deleted

## 2023-09-28 DIAGNOSIS — R0989 Other specified symptoms and signs involving the circulatory and respiratory systems: Secondary | ICD-10-CM

## 2023-10-05 ENCOUNTER — Ambulatory Visit (INDEPENDENT_AMBULATORY_CARE_PROVIDER_SITE_OTHER): Payer: BC Managed Care – PPO | Admitting: Orthopedic Surgery

## 2023-10-05 DIAGNOSIS — I7090 Unspecified atherosclerosis: Secondary | ICD-10-CM | POA: Insufficient documentation

## 2023-10-05 DIAGNOSIS — Z981 Arthrodesis status: Secondary | ICD-10-CM | POA: Diagnosis not present

## 2023-10-05 DIAGNOSIS — M1990 Unspecified osteoarthritis, unspecified site: Secondary | ICD-10-CM | POA: Insufficient documentation

## 2023-10-05 DIAGNOSIS — Z720 Tobacco use: Secondary | ICD-10-CM | POA: Insufficient documentation

## 2023-10-10 ENCOUNTER — Encounter: Payer: Self-pay | Admitting: Orthopedic Surgery

## 2023-10-10 NOTE — Progress Notes (Signed)
Office Visit Note   Patient: Joel Bailey           Date of Birth: Feb 03, 1956           MRN: [id number] Visit Date: 10/05/2023              Requested by: No referring provider defined for this encounter. PCP: Pcp, No  Chief Complaint  Patient presents with   Right Ankle - Follow-up    12/23/2022 tib-cal fusion      HPI: Patient is a 67 year old gentleman who is 3 years status post right tibial calcaneal fusion he is in a fracture boot.  Patient does have double upright braces but states that he has trouble with his toe folding under trying to get into the shoe.  Assessment & Plan: Visit Diagnoses:  1. S/P ankle fusion     Plan: Patient will follow-up with vascular vein surgery.  He is given a note to be out of work for 2 months.  Due to the difficulty with regular shoewear patient would like to proceed with a fusion of the great toe MTP joint.  Follow-Up Instructions: Return in about 4 weeks (around 11/02/2023).   Ortho Exam  Patient is alert, oriented, no adenopathy, well-dressed, normal affect, normal respiratory effort. Patient has a palpable dorsalis pedis pulse he has weakness with EHL strength of the right great toe he cannot actively extend his toe.  Patient's tibial calcaneal fusion is stable.  Imaging: No results found. No images are attached to the encounter.  Labs: No results found for: "HGBA1C", "ESRSEDRATE", "CRP", "LABURIC", "REPTSTATUS", "GRAMSTAIN", "CULT", "LABORGA"   No results found for: "ALBUMIN", "PREALBUMIN", "CBC"  No results found for: "MG" No results found for: "VD25OH"  No results found for: "PREALBUMIN"    Latest Ref Rng & Units 12/23/2022    7:25 AM 12/01/2017    1:26 PM 08/02/2017    2:48 PM  CBC EXTENDED  WBC 4.0 - 10.5 K/uL 13.7  16.0  11.8   RBC 4.22 - 5.81 MIL/uL 4.48  4.51  3.99   Hemoglobin 13.0 - 17.0 g/dL 40.9  81.1  91.4   HCT 39.0 - 52.0 % 42.9  43.3  38.1   Platelets 150 - 400 K/uL 245  277  241      There is no  height or weight on file to calculate BMI.  Orders:  No orders of the defined types were placed in this encounter.  No orders of the defined types were placed in this encounter.    Procedures: No procedures performed  Clinical Data: No additional findings.  ROS:  All other systems negative, except as noted in the HPI. Review of Systems  Objective: Vital Signs: There were no vitals taken for this visit.  Specialty Comments:  No specialty comments available.  PMFS History: Patient Active Problem List   Diagnosis Date Noted   Arterial atherosclerosis    Arthritis    Osteoarthritis    Tobacco abuse disorder    Unstable ankle, right 12/23/2022   Cavovarus deformity of foot, acquired, right 12/23/2022   Ankle weakness 02/16/2021   Acquired varus deformity of right foot 11/27/2018   Lumbar stenosis with neurogenic claudication 12/07/2017   HNP (herniated nucleus pulposus) with myelopathy, cervical 08/07/2017   Cigarette nicotine dependence without complication 08/01/2016   Erectile dysfunction 08/01/2016   Pedal edema 08/01/2016   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   Ankle weakness  02/16/2021   Arterial atherosclerosis    Arthritis    Cavovarus deformity of foot, acquired, right 12/23/2022   Cigarette nicotine dependence without complication 08/01/2016   Erectile dysfunction 08/01/2016   HNP (herniated nucleus pulposus) with myelopathy, cervical 08/07/2017   Lumbar stenosis with neurogenic claudication 12/07/2017   Osteoarthritis    Pedal edema 08/01/2016   right sided     Tobacco abuse disorder    Unstable ankle, right 12/23/2022    Family History  Problem Relation Age of Onset   Diabetes Mother    Heart attack Mother    Heart disease Mother    Diabetes Sister    Heart disease Sister    Diabetes Sister    Diabetes Brother    Heart disease Brother     Past Surgical History:  Procedure Laterality  Date   ANTERIOR CERVICAL DECOMPRESSION/DISCECTOMY FUSION 4 LEVELS N/A 08/07/2017   Procedure: ANTERIOR CERVICAL DECOMPRESSION/DISCECTOMY FUSION CERVICAL 3- CERVICAL 4, CERVICAL 4 - CERVICAL 5, CERVICAL 5- CERVICAL 6, CERVICAL 6- CERVICAL 7;  Surgeon: Shirlean Kelly, MD;  Location: [facility] OR;  Service: Neurosurgery;  Laterality: N/A;   BACK SURGERY  2017   FOOT ARTHRODESIS Right 12/23/2022   Procedure: RIGHT TIBIOCALCANEAL FUSION;  Surgeon: Nadara Mustard, MD;  Location: [facility] OR;  Service: Orthopedics;  Laterality: Right;   Social History   Occupational History   Not on file  Tobacco Use   Smoking status: Every Day    Current packs/day: 1.00    Average packs/day: 1 pack/day for 52.0 years (52.0 ttl pk-yrs)    Types: Cigarettes   Smokeless tobacco: Never  Vaping Use   Vaping status: Never Used  Substance and Sexual Activity   Alcohol use: No   Drug use: No   Sexual activity: Not on file

## 2023-10-11 ENCOUNTER — Ambulatory Visit (HOSPITAL_COMMUNITY)
Admission: RE | Admit: 2023-10-11 | Discharge: 2023-10-11 | Disposition: A | Discharge status: Home / Self Care | Payer: BC Managed Care – PPO | Source: Ambulatory Visit | Attending: Vascular Surgery | Admitting: Vascular Surgery

## 2023-10-11 ENCOUNTER — Ambulatory Visit (INDEPENDENT_AMBULATORY_CARE_PROVIDER_SITE_OTHER): Payer: BC Managed Care – PPO | Admitting: Cardiology

## 2023-10-11 ENCOUNTER — Ambulatory Visit: Payer: BC Managed Care – PPO | Admitting: Vascular Surgery

## 2023-10-11 ENCOUNTER — Encounter: Payer: Self-pay | Admitting: Cardiology

## 2023-10-11 ENCOUNTER — Encounter: Payer: Self-pay | Admitting: Vascular Surgery

## 2023-10-11 VITALS — BP 156/78 | HR 56 | Temp 98.7°F | Resp 20 | Ht 70.0 in | Wt 140.0 lb

## 2023-10-11 VITALS — BP 130/70 | HR 58 | Ht 70.0 in | Wt 140.2 lb

## 2023-10-11 DIAGNOSIS — I7090 Unspecified atherosclerosis: Secondary | ICD-10-CM | POA: Diagnosis not present

## 2023-10-11 DIAGNOSIS — R0989 Other specified symptoms and signs involving the circulatory and respiratory systems: Secondary | ICD-10-CM | POA: Diagnosis not present

## 2023-10-11 DIAGNOSIS — R011 Cardiac murmur, unspecified: Secondary | ICD-10-CM | POA: Insufficient documentation

## 2023-10-11 DIAGNOSIS — R6889 Other general symptoms and signs: Secondary | ICD-10-CM | POA: Diagnosis not present

## 2023-10-11 DIAGNOSIS — F17210 Nicotine dependence, cigarettes, uncomplicated: Secondary | ICD-10-CM | POA: Insufficient documentation

## 2023-10-11 DIAGNOSIS — I2510 Atherosclerotic heart disease of native coronary artery without angina pectoris: Secondary | ICD-10-CM | POA: Insufficient documentation

## 2023-10-11 HISTORY — DX: Cardiac murmur, unspecified: R01.1

## 2023-10-11 HISTORY — DX: Atherosclerotic heart disease of native coronary artery without angina pectoris: I25.10

## 2023-10-11 HISTORY — DX: Nicotine dependence, cigarettes, uncomplicated: F17.210

## 2023-10-11 LAB — VAS US ABI WITH/WO TBI
Left ABI: 1.13
Right ABI: 1.13

## 2023-10-11 MED ORDER — ROSUVASTATIN CALCIUM 10 MG PO TABS: 10.0000 mg | ORAL_TABLET | Freq: Every day | ORAL | 3 refills | Status: DC

## 2023-10-11 MED ORDER — NITROGLYCERIN 0.4 MG SL SUBL: 0.4000 mg | SUBLINGUAL_TABLET | SUBLINGUAL | 6 refills | Status: AC | PRN

## 2023-10-11 NOTE — Progress Notes (Signed)
Patient ID: Joel Bailey, male   DOB: 07-05-1956, 67 y.o.   MRN: [id number]  Reason for Consult: New Patient (Initial Visit)   Referred by Noni Saupe, MD  Subjective:     HPI:  Joel Bailey is a 67 y.o. male has a history of neurogenic claudication has undergone neck and back fusions in the past and more recently a right tibial calcaneal fusion for which she is currently wearing a boot.  This is healed well.  Patient with only limitation of walking is with the boot and he is currently being fitted for a shoe under the direction of Dr. Lajoyce Corners.  He denies any claudication.  He does not have any tissue loss or ulceration and has not had any issues with healing.  No previous history of stroke, TIA or amaurosis and no personal or family history of aneurysm disease.  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   Cigarette nicotine dependence without complication 08/01/2016   Erectile dysfunction 08/01/2016   HNP (herniated nucleus pulposus) with myelopathy, cervical 08/07/2017   Lumbar stenosis with neurogenic claudication 12/07/2017   Osteoarthritis    Pedal edema 08/01/2016   right sided     Tobacco abuse disorder    Unstable ankle, right 12/23/2022   Family History  Problem Relation Age of Onset   Diabetes Mother    Heart attack Mother    Heart disease Mother    Diabetes Sister    Heart disease Sister    Diabetes Sister    Diabetes Brother    Heart disease Brother    Past Surgical History:  Procedure Laterality Date   ANTERIOR CERVICAL DECOMPRESSION/DISCECTOMY FUSION 4 LEVELS N/A 08/07/2017   Procedure: ANTERIOR CERVICAL DECOMPRESSION/DISCECTOMY FUSION CERVICAL 3- CERVICAL 4, CERVICAL 4 - CERVICAL 5, CERVICAL 5- CERVICAL 6, CERVICAL 6- CERVICAL 7;  Surgeon: Shirlean Kelly, MD;  Location: [facility]  OR;  Service: Neurosurgery;  Laterality: N/A;   BACK SURGERY  2017   FOOT ARTHRODESIS Right 12/23/2022   Procedure: RIGHT TIBIOCALCANEAL FUSION;  Surgeon: Nadara Mustard, MD;  Location: [facility] OR;  Service: Orthopedics;  Laterality: Right;    Short Social History:  Social History   Tobacco Use   Smoking status: Every Day    Current packs/day: 1.00    Average packs/day: 1 pack/day for 52.0 years (52.0 ttl pk-yrs)    Types: Cigarettes   Smokeless tobacco: Never  Substance Use Topics   Alcohol use: No    No Known Allergies  Current Outpatient Medications  Medication Sig Dispense Refill   aspirin EC 81 MG tablet Take 81 mg by mouth daily. Swallow whole.     No current facility-administered medications for this visit.    Review of Systems  Constitutional:  Constitutional negative. HENT: HENT negative.  Eyes: Eyes negative.  Respiratory: Respiratory negative.  Cardiovascular: Cardiovascular negative.  GI: Gastrointestinal negative.  Musculoskeletal: Positive for back pain and gait problem.  Neurological: Positive for focal weakness and numbness.  Hematologic: Hematologic/lymphatic negative.  Psychiatric: Psychiatric negative.        Objective:  Objective   Vitals:   10/11/23 1034  BP: (!) 156/78  Pulse: (!) 56  Resp: 20  Temp: 98.7 F (37.1 C)  SpO2: 98%  Weight: 140 lb (63.5 kg)  Height: 5\' 10"  (1.778 m)  Body mass index is 20.09 kg/m.  Physical Exam HENT:     Head: Normocephalic.     Nose: Nose normal.  Neck:     Vascular: No carotid bruit.  Cardiovascular:     Rate and Rhythm: Normal rate.     Pulses:          Femoral pulses are 2+ on the right side and 2+ on the left side.      Popliteal pulses are 2+ on the right side and 2+ on the left side.       Posterior tibial pulses are 2+ on the left side.     Comments: Wearing a cam boot on the right which precluded pulse exam Pulmonary:     Effort: Pulmonary effort is normal.  Abdominal:     General:  Abdomen is flat.     Palpations: Abdomen is soft. There is no mass.  Musculoskeletal:     Right lower leg: Edema present.     Left lower leg: No edema.  Skin:    Capillary Refill: Capillary refill takes less than 2 seconds.  Neurological:     General: No focal deficit present.     Mental Status: He is alert.  Psychiatric:        Mood and Affect: Mood normal.        Thought Content: Thought content normal.        Judgment: Judgment normal.     Data: ABI Findings:  +---------+------------------+-----+---------+--------+  Right   Rt Pressure (mmHg)IndexWaveform Comment   +---------+------------------+-----+---------+--------+  Brachial 168                                       +---------+------------------+-----+---------+--------+  PTA     189               1.12 biphasic           +---------+------------------+-----+---------+--------+  DP      172               1.02 triphasic          +---------+------------------+-----+---------+--------+  Great Toe121               0.72                    +---------+------------------+-----+---------+--------+   +---------+------------------+-----+---------+-------+  Left    Lt Pressure (mmHg)IndexWaveform Comment  +---------+------------------+-----+---------+-------+  Brachial 168                                      +---------+------------------+-----+---------+-------+  PTA     189               1.12 triphasic         +---------+------------------+-----+---------+-------+  DP      183               1.09 triphasic         +---------+------------------+-----+---------+-------+  Great Toe129               0.77                   +---------+------------------+-----+---------+-------+   +-------+-----------+-----------+------------+------------+  ABI/TBIToday's ABIToday's TBIPrevious ABIPrevious TBI  +-------+-----------+-----------+------------+------------+  Right 1.13        0.72                                 +-------+-----------+-----------+------------+------------+  Left  1.13       0.77                                 +-------+-----------+-----------+------------+------------+           Summary:  Right: Resting right ankle-brachial index is within normal range. The  right toe-brachial index is normal.   Left: Resting left ankle-brachial index is within normal range. The left  toe-brachial index is normal.      Assessment/Plan:    66 year old male sent for evaluation of decreased pedal pulses.  His ABIs are normal and triphasic he is easily palpable on the left side with a palpable popliteal on left that is barely palpable.  As such she can follow-up on an as-needed basis.     Maeola Harman MD [facility]

## 2023-10-11 NOTE — Progress Notes (Signed)
Cardiology Office Note:    Date:  10/11/2023   ID:  NAVARRO BRANCATO, DOB 02-08-1956, MRN [id number]  PCP:  Annamaria Helling, DO  Cardiologist:  Garwin Brothers, MD   Referring MD: Noni Saupe, MD    ASSESSMENT:    1. Arterial atherosclerosis   2. Coronary artery calcification seen on CT scan   3. Cigarette smoker   4. Cardiac murmur    PLAN:    In order of problems listed above:  Coronary artery calcification: Secondary prevention stressed to the patient.  Importance of compliance with diet medication stressed and vocalized understanding.  He does not ambulate much.  Therefore he is not symptomatic.  I would like to stress him with Lexiscan sestamibi to see if any of these issues need intervention.  He is agreeable.  He was advised to take a coated baby aspirin on a daily basis.  Sublingual nitroglycerin prescription was sent, its protocol and 911 protocol explained and the patient vocalized understanding questions were answered to the patient's satisfaction Coronary risk reduction: His lipids are fine.  Will get liver function tests and initiate him on statin rosuvastatin 10 mg daily once have the results of those tests. Elevated pressure without diagnosis of hypertension: He appears anxious today.  Will do blood pressure readings at home and bring it back to [place] next week when he comes for the stress test and we will act accordingly and intervene as necessary.  Salt intake issues, diet and lifestyle modification urged. Cigarette smoker: I spent 5 minutes with the patient discussing solely about smoking. Smoking cessation was counseled. I suggested to the patient also different medications and pharmacological interventions. Patient is keen to try stopping on its own at this time. He will get back to me if he needs any further assistance in this matter. Patient will be seen in follow-up appointment in 6 months or earlier if the patient has any concerns.    Medication  Adjustments/Labs and Tests Ordered: Current medicines are reviewed at length with the patient today.  Concerns regarding medicines are outlined above.  Orders Placed This Encounter  Procedures   EKG 12-Lead   No orders of the defined types were placed in this encounter.    History of Present Illness:    Joel Bailey is a 67 y.o. male who is being seen today for the evaluation of coronary artery calcification seen on CT scan at the request of Noni Saupe, MD. patient is a pleasant 67 year old male.  He has past medical history of cigarette smoking which is heavy.  He recently got a calcium scoring CT scan with significant coronary artery calcifications which was heavy.  For this reason he was referred here.  He has a ankle fusion and does not ambulate much.  He denies any chest pain orthopnea or PND.  Unfortunately he continues to smoke a week since young age.  At the time of my evaluation, the patient is alert awake oriented and in no distress.  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   Cigarette nicotine dependence without complication 08/01/2016   Erectile dysfunction 08/01/2016   HNP (herniated nucleus pulposus) with myelopathy, cervical 08/07/2017   Lumbar stenosis with neurogenic claudication 12/07/2017   Osteoarthritis    Pedal edema 08/01/2016   right sided  Tobacco abuse disorder    Unstable ankle, right 12/23/2022    Past Surgical History:  Procedure Laterality Date   ANTERIOR CERVICAL DECOMPRESSION/DISCECTOMY FUSION 4 LEVELS N/A 08/07/2017   Procedure: ANTERIOR CERVICAL DECOMPRESSION/DISCECTOMY FUSION CERVICAL 3- CERVICAL 4, CERVICAL 4 - CERVICAL 5, CERVICAL 5- CERVICAL 6, CERVICAL 6- CERVICAL 7;  Surgeon: Shirlean Kelly, MD;  Location: [facility] OR;  Service: Neurosurgery;  Laterality: N/A;    BACK SURGERY  2017   FOOT ARTHRODESIS Right 12/23/2022   Procedure: RIGHT TIBIOCALCANEAL FUSION;  Surgeon: Nadara Mustard, MD;  Location: [facility];  Service: Orthopedics;  Laterality: Right;    Current Medications: Current Meds  Medication Sig   aspirin EC 81 MG tablet Take 81 mg by mouth daily. Swallow whole.     Allergies:   Patient has no known allergies.   Social History   Socioeconomic History   Marital status: Widowed    Spouse name: Not on file   Number of children: Not on file   Years of education: Not on file   Highest education level: Not on file  Occupational History   Not on file  Tobacco Use   Smoking status: Every Day    Current packs/day: 1.00    Average packs/day: 1 pack/day for 52.0 years (52.0 ttl pk-yrs)    Types: Cigarettes   Smokeless tobacco: Never  Vaping Use   Vaping status: Never Used  Substance and Sexual Activity   Alcohol use: No   Drug use: No   Sexual activity: Not on file  Other Topics Concern   Not on file  Social History Narrative   Not on file   Social Determinants of Health   Financial Resource Strain: Not on file  Food Insecurity: Not on file  Transportation Needs: Not on file  Physical Activity: Not on file  Stress: Not on file  Social Connections: Not on file     Family History: The patient's family history includes Diabetes in his brother, mother, sister, and sister; Heart attack in his mother; Heart disease in his brother, mother, and sister.  ROS:   Please see the history of present illness.    All other systems reviewed and are negative.  EKGs/Labs/Other Studies Reviewed:    The following studies were reviewed today:  EKG Interpretation Date/Time:  Wednesday October 11 2023 13:47:46 EDT Ventricular Rate:  58 PR Interval:  158 QRS Duration:  84 QT Interval:  426 QTC Calculation: 418 R Axis:   -33  Text Interpretation: Sinus bradycardia with Premature atrial complexes Left axis deviation Pulmonary disease  pattern Abnormal ECG When compared with ECG of 02-Aug-2017 13:37, Premature atrial complexes are now Present Confirmed by Belva Crome [phone]) on 10/11/2023 1:52:43 PM     Recent Labs: 12/23/2022: Hemoglobin 14.7; Platelets 245  Recent Lipid Panel No results found for: "CHOL", "TRIG", "HDL", "CHOLHDL", "VLDL", "LDLCALC", "LDLDIRECT"  Physical Exam:    VS:  BP (!) 160/74   Pulse (!) 58   Ht 5\' 10"  (1.778 m)   Wt 140 lb 3.2 oz (63.6 kg)   SpO2 97%   BMI 20.12 kg/m     Wt Readings from Last 3 Encounters:  10/11/23 140 lb 3.2 oz (63.6 kg)  10/11/23 140 lb (63.5 kg)  12/23/22 140 lb (63.5 kg)     GEN: Patient is in no acute distress HEENT: Normal NECK: No JVD; No carotid bruits LYMPHATICS: No lymphadenopathy CARDIAC: S1 S2 regular, 2/6 systolic murmur at the apex. RESPIRATORY:  Clear to auscultation without  rales, wheezing or rhonchi  ABDOMEN: Soft, non-tender, non-distended MUSCULOSKELETAL:  No edema; No deformity  SKIN: Warm and dry NEUROLOGIC:  Alert and oriented x 3 PSYCHIATRIC:  Normal affect    Signed, Garwin Brothers, MD  10/11/2023 2:09 PM    [facility]

## 2023-10-11 NOTE — Patient Instructions (Addendum)
Please keep a BP log for 2 weeks and send by MyChart or mail.  Blood Pressure Record Sheet To take your blood pressure, you will need a blood pressure machine. You can buy a blood pressure machine (blood pressure monitor) at your clinic, drug store, or online. When choosing one, consider: An automatic monitor that has an arm cuff. A cuff that wraps snugly around your upper arm. You should be able to fit only one finger between your arm and the cuff. A device that stores blood pressure reading results. Do not choose a monitor that measures your blood pressure from your wrist or finger. Follow your health care provider's instructions for how to take your blood pressure. To use this form: Get one reading in the morning (a.m.) 1-2 hours after you take any medicines. Get one reading in the evening (p.m.) before supper.   Blood pressure log Date: _______________________  a.m. _____________________(1st reading) HR___________            p.m. _____________________(2nd reading) HR__________  Date: _______________________  a.m. _____________________(1st reading) HR___________            p.m. _____________________(2nd reading) HR__________  Date: _______________________  a.m. _____________________(1st reading) HR___________            p.m. _____________________(2nd reading) HR__________  Date: _______________________  a.m. _____________________(1st reading) HR___________            p.m. _____________________(2nd reading) HR__________  Date: _______________________  a.m. _____________________(1st reading) HR___________            p.m. _____________________(2nd reading) HR__________  Date: _______________________  a.m. _____________________(1st reading) HR___________            p.m. _____________________(2nd reading) HR__________  Date: _______________________  a.m. _____________________(1st reading) HR___________            p.m. _____________________(2nd reading)  HR__________   This information is not intended to replace advice given to you by your health care provider. Make sure you discuss any questions you have with your health care provider. Document Revised: 03/18/2020 Document Reviewed: 03/18/2020 Elsevier Patient Education  2021 Elsevier Inc.   Medication Instructions:  Your physician has recommended you make the following change in your medication:   Start Crestor 10 mg daily.  Use nitroglycerin 1 tablet placed under the tongue at the first sign of chest pain or an angina attack. 1 tablet may be used every 5 minutes as needed, for up to 15 minutes. Do not take more than 3 tablets in 15 minutes. If pain persist call 911 or go to the nearest ED.  *If you need a refill on your cardiac medications before your next appointment, please call your pharmacy*   Lab Work: Your physician recommends that you have a CMP today in the office.  If you have labs (blood work) drawn today and your tests are completely normal, you will receive your results only by: MyChart Message (if you have MyChart) OR A paper copy in the mail If you have any lab test that is abnormal or we need to change your treatment, we will call you to review the results.   Testing/Procedures: You are scheduled for a Myocardial Perfusion Imaging Study.  Please arrive 15 minutes prior to your appointment time for registration and insurance purposes.  The test will take approximately 3 to 4 hours to complete; you may bring reading material.  If someone comes with you to your appointment, they will need to remain in the main lobby due to limited space in the testing area.  How to prepare for your Myocardial Perfusion Test: Do not eat or drink 3 hours prior to your test, except you may have water. Do not consume products containing caffeine (regular or decaffeinated) 12 hours prior to your test. (ex: coffee, chocolate, sodas, tea). Do bring a list of your current medications with  you.  If not listed below, you may take your medications as normal. Do wear comfortable clothes (no dresses or overalls) and walking shoes, tennis shoes preferred (No heels or open toe shoes are allowed). Do NOT wear cologne, perfume, aftershave, or lotions (deodorant is allowed). If these instructions are not followed, your test will have to be rescheduled.  If you cannot keep your appointment, please provide 24 hours notification to the Nuclear Lab, to avoid a possible $50 charge to your account.   Your physician has requested that you have an echocardiogram. Echocardiography is a painless test that uses sound waves to create images of your heart. It provides your doctor with information about the size and shape of your heart and how well your heart's chambers and valves are working. This procedure takes approximately one hour. There are no restrictions for this procedure. Please do NOT wear cologne, perfume, aftershave, or lotions (deodorant is allowed). Please arrive 15 minutes prior to your appointment time.   Follow-Up: At [facility], you and your health needs are our priority.  As part of our continuing mission to provide you with exceptional heart care, we have created designated Provider Care Teams.  These Care Teams include your primary Cardiologist (physician) and Advanced Practice Providers (APPs -  Physician Assistants and Nurse Practitioners) who all work together to provide you with the care you need, when you need it.  We recommend signing up for the patient portal called "MyChart".  Sign up information is provided on this After Visit Summary.  MyChart is used to connect with patients for Virtual Visits (Telemedicine).  Patients are able to view lab/test results, encounter notes, upcoming appointments, etc.  Non-urgent messages can be sent to your provider as well.   To learn more about what you can do with MyChart, go to [email or web address].    Your next  appointment:   9 month(s)  Provider:   Belva Crome, MD   Other Instructions  Cardiac Nuclear Scan A cardiac nuclear scan is a test that is done to check the flow of blood to your heart. It is done when you are resting and when you are exercising. The test looks for problems such as: Not enough blood reaching a portion of the heart. The heart muscle not working as it should. You may need this test if you have: Heart disease. Lab results that are not normal. Had heart surgery or a balloon procedure to open up blocked arteries (angioplasty) or a small mesh tube (stent). Chest pain. Shortness of breath. Had a heart attack. In this test, a special dye (tracer) is put into your bloodstream. The tracer will travel to your heart. A camera will then take pictures of your heart to see how the tracer moves through your heart. This test is usually done at a hospital and takes 2-4 hours. Tell a doctor about: Any allergies you have. All medicines you are taking, including vitamins, herbs, eye drops, creams, and over-the-counter medicines. Any bleeding problems you have. Any surgeries you have had. Any medical conditions you have. Whether you are pregnant or may be pregnant. Any history of asthma or long-term (chronic) lung disease. Any history of heart  rhythm disorders or heart valve conditions. What are the risks? Your doctor will talk with you about risks. These may include: Serious chest pain and heart attack. This is only a risk if the stress portion of the test is done. Fast or uneven heartbeats (palpitations). A feeling of warmth in your chest. This feeling usually does not last long. Allergic reaction to the tracer. Shortness of breath or trouble breathing. What happens before the test? Ask your doctor about changing or stopping your normal medicines. Follow instructions from your doctor about what you cannot eat or drink. Remove your jewelry on the day of the test. Ask your  doctor if you need to avoid nicotine or caffeine. What happens during the test? An IV tube will be inserted into one of your veins. Your doctor will give you a small amount of tracer through the IV tube. You will wait for 20-40 minutes while the tracer moves through your bloodstream. Your heart will be monitored with an electrocardiogram (ECG). You will lie down on an exam table. Pictures of your heart will be taken for about 15-20 minutes. You may also have a stress test. For this test, one of these things may be done: You will be asked to exercise on a treadmill or a stationary bike. You will be given medicines that will make your heart work harder. This is done if you are unable to exercise. When blood flow to your heart has peaked, a tracer will again be given through the IV tube. After 20-40 minutes, you will get back on the exam table. More pictures will be taken of your heart. Depending on the tracer that is used, more pictures may need to be taken 3-4 hours later. Your IV tube will be removed when the test is over. The test may vary among doctors and hospitals. What happens after the test? Ask your doctor: Whether you can return to your normal schedule, including diet, activities, travel, and medicines. Whether you should drink more fluids. This will help to remove the tracer from your body. Ask your doctor, or the department that is doing the test: When will my results be ready? How will I get my results? What are my treatment options? What other tests do I need? What are my next steps? This information is not intended to replace advice given to you by your health care provider. Make sure you discuss any questions you have with your health care provider. Document Revised: 04/26/2022 Document Reviewed: 04/26/2022 Elsevier Patient Education  2023 Elsevier Inc.  Echocardiogram An echocardiogram is a test that uses sound waves (ultrasound) to produce images of the heart. Images  from an echocardiogram can provide important information about: Heart size and shape. The size and thickness and movement of your heart's walls. Heart muscle function and strength. Heart valve function or if you have stenosis. Stenosis is when the heart valves are too narrow. If blood is flowing backward through the heart valves (regurgitation). A tumor or infectious growth around the heart valves. Areas of heart muscle that are not working well because of poor blood flow or injury from a heart attack. Aneurysm detection. An aneurysm is a weak or damaged part of an artery wall. The wall bulges out from the normal force of blood pumping through the body. Tell a health care provider about: Any allergies you have. All medicines you are taking, including vitamins, herbs, eye drops, creams, and over-the-counter medicines. Any blood disorders you have. Any surgeries you have had. Any medical  conditions you have. Whether you are pregnant or may be pregnant. What are the risks? Generally, this is a safe test. However, problems may occur, including an allergic reaction to dye (contrast) that may be used during the test. What happens before the test? No specific preparation is needed. You may eat and drink normally. What happens during the test?  You will take off your clothes from the waist up and put on a hospital gown. Electrodes or electrocardiogram (ECG)patches may be placed on your chest. The electrodes or patches are then connected to a device that monitors your heart rate and rhythm. You will lie down on a table for an ultrasound exam. A gel will be applied to your chest to help sound waves pass through your skin. A handheld device, called a transducer, will be pressed against your chest and moved over your heart. The transducer produces sound waves that travel to your heart and bounce back (or "echo" back) to the transducer. These sound waves will be captured in real-time and changed into  images of your heart that can be viewed on a video monitor. The images will be recorded on a computer and reviewed by your health care provider. You may be asked to change positions or hold your breath for a short time. This makes it easier to get different views or better views of your heart. In some cases, you may receive contrast through an IV in one of your veins. This can improve the quality of the pictures from your heart. The procedure may vary among health care providers and hospitals. What can I expect after the test? You may return to your normal, everyday life, including diet, activities, and medicines, unless your health care provider tells you not to do that. Follow these instructions at home: It is up to you to get the results of your test. Ask your health care provider, or the department that is doing the test, when your results will be ready. Keep all follow-up visits. This is important. Summary An echocardiogram is a test that uses sound waves (ultrasound) to produce images of the heart. Images from an echocardiogram can provide important information about the size and shape of your heart, heart muscle function, heart valve function, and other possible heart problems. You do not need to do anything to prepare before this test. You may eat and drink normally. After the echocardiogram is completed, you may return to your normal, everyday life, unless your health care provider tells you not to do that. This information is not intended to replace advice given to you by your health care provider. Make sure you discuss any questions you have with your health care provider. Document Revised: 08/11/2021 Document Reviewed: 07/21/2020 Elsevier Patient Education  2023 [employer].

## 2023-10-12 LAB — COMPREHENSIVE METABOLIC PANEL
ALT: 8 [IU]/L (ref 0–44)
AST: 16 [IU]/L (ref 0–40)
Albumin: 4.3 g/dL (ref 3.9–4.9)
Alkaline Phosphatase: 74 [IU]/L (ref 44–121)
BUN/Creatinine Ratio: 11 (ref 10–24)
BUN: 11 mg/dL (ref 8–27)
Bilirubin Total: 0.3 mg/dL (ref 0.0–1.2)
CO2: 23 mmol/L (ref 20–29)
Calcium: 9.4 mg/dL (ref 8.6–10.2)
Chloride: 104 mmol/L (ref 96–106)
Creatinine, Ser: 1.01 mg/dL (ref 0.76–1.27)
Globulin, Total: 2.4 g/dL (ref 1.5–4.5)
Glucose: 86 mg/dL (ref 70–99)
Potassium: 4.3 mmol/L (ref 3.5–5.2)
Sodium: 142 mmol/L (ref 134–144)
Total Protein: 6.7 g/dL (ref 6.0–8.5)
eGFR: 83 mL/min/{1.73_m2} (ref >59)

## 2023-10-16 DIAGNOSIS — M25571 Pain in right ankle and joints of right foot: Secondary | ICD-10-CM | POA: Diagnosis not present

## 2023-10-16 DIAGNOSIS — I700 Atherosclerosis of aorta: Secondary | ICD-10-CM | POA: Diagnosis not present

## 2023-10-16 DIAGNOSIS — D72829 Elevated white blood cell count, unspecified: Secondary | ICD-10-CM | POA: Diagnosis not present

## 2023-10-16 DIAGNOSIS — I2510 Atherosclerotic heart disease of native coronary artery without angina pectoris: Secondary | ICD-10-CM | POA: Diagnosis not present

## 2023-10-17 ENCOUNTER — Telehealth (HOSPITAL_COMMUNITY): Payer: Self-pay | Admitting: *Deleted

## 2023-10-17 NOTE — Telephone Encounter (Signed)
Patient given detailed instructions per Myocardial Perfusion Study Information Sheet for the test on 10/19/23 Patient notified to arrive 15 minutes early and that it is imperative to arrive on time for appointment to keep from having the test rescheduled.  If you need to cancel or reschedule your appointment, please call the office within 24 hours of your appointment. . Patient verbalized understanding. Ricky Ala

## 2023-10-19 ENCOUNTER — Ambulatory Visit: Payer: BC Managed Care – PPO | Attending: Cardiology

## 2023-10-19 DIAGNOSIS — I2510 Atherosclerotic heart disease of native coronary artery without angina pectoris: Secondary | ICD-10-CM

## 2023-10-19 MED ORDER — TECHNETIUM TC 99M TETROFOSMIN IV KIT
24.5000 | PACK | Freq: Once | INTRAVENOUS | Status: AC | PRN
  Administered 2023-10-19: 24.5 via INTRAVENOUS

## 2023-10-19 MED ORDER — TECHNETIUM TC 99M TETROFOSMIN IV KIT
7.9000 | PACK | Freq: Once | INTRAVENOUS | Status: AC | PRN
  Administered 2023-10-19: 7.9 via INTRAVENOUS

## 2023-10-19 MED ORDER — REGADENOSON 0.4 MG/5ML IV SOLN
0.4000 mg | Freq: Once | INTRAVENOUS | Status: AC
  Administered 2023-10-19: 0.4 mg via INTRAVENOUS

## 2023-10-20 LAB — MYOCARDIAL PERFUSION IMAGING
LV dias vol: 103 mL (ref 62–150)
LV sys vol: 49 mL
Nuc Stress EF: 52 %
Peak HR: 95 {beats}/min
Rest HR: 48 {beats}/min
Rest Nuclear Isotope Dose: 7.9 mCi
SDS: 2
SRS: 2
SSS: 4
ST Depression (mm): 0 mm
Stress Nuclear Isotope Dose: 24.5 mCi
TID: 1.04

## 2023-10-23 ENCOUNTER — Telehealth: Payer: Self-pay

## 2023-10-23 ENCOUNTER — Telehealth: Payer: Self-pay | Admitting: Cardiology

## 2023-10-23 NOTE — Telephone Encounter (Signed)
-----   Message from Aundra Dubin Revankar sent at 10/20/2023  9:46 AM EST ----- EF is mildly diminished according to this report.  I will await echocardiogram to give my final report of this.  I depend more on the echo.  Otherwise the results of the study is unremarkable. Please inform patient. I will discuss in detail at next appointment. Cc  primary care/referring physician Garwin Brothers, MD 10/20/2023 9:45 AM

## 2023-10-23 NOTE — Telephone Encounter (Signed)
Patient is returning phone call about results. 

## 2023-10-23 NOTE — Telephone Encounter (Signed)
Left vm to return our call.  Stress test showed diminished ejection fraction. This will be better assessed on your echo. Otherwise stress test was normal.

## 2023-10-24 NOTE — Telephone Encounter (Signed)
Results reviewed with pt as per Dr. Revankar's note.  Pt verbalized understanding and had no additional questions. Routed to PCP.  

## 2023-11-06 ENCOUNTER — Ambulatory Visit: Payer: BC Managed Care – PPO

## 2023-11-06 ENCOUNTER — Telehealth: Payer: Self-pay | Admitting: Orthopedic Surgery

## 2023-11-15 ENCOUNTER — Ambulatory Visit: Payer: BC Managed Care – PPO | Attending: Cardiology

## 2023-11-15 DIAGNOSIS — I2510 Atherosclerotic heart disease of native coronary artery without angina pectoris: Secondary | ICD-10-CM | POA: Diagnosis not present

## 2023-11-15 DIAGNOSIS — R011 Cardiac murmur, unspecified: Secondary | ICD-10-CM | POA: Diagnosis not present

## 2023-11-15 LAB — ECHOCARDIOGRAM COMPLETE
Area-P 1/2: 2.99 cm2
S' Lateral: 2.7 cm

## 2023-11-28 ENCOUNTER — Other Ambulatory Visit: Payer: Self-pay

## 2023-11-28 ENCOUNTER — Encounter (HOSPITAL_COMMUNITY): Payer: Self-pay | Admitting: Orthopedic Surgery

## 2023-11-28 NOTE — Progress Notes (Signed)
SDW CALL  Patient was given pre-op instructions over the phone. The opportunity was given for the patient to ask questions. No further questions asked. Patient verbalized understanding of instructions given.   PCP - Barron Alvine Cardiologist - Glean Hess Revankar,MD  PPM/ICD - denies Device Orders -  Rep Notified -   Chest x-ray - CT-09/08/23 EKG - 10/11/23 Stress Test - 10/19/23 ECHO - 10/11/23 Cardiac Cath - denies  Sleep Study -denies  CPAP -   Fasting Blood Sugar - na Checks Blood Sugar _____ times a day  Blood Thinner Instructions:na Aspirin Instructions:pt states he will call prescribing MD Revankar today to see if it is ok for him to hold aspirin. He will call Dr. Lajoyce Corners to see if needs to hold aspirin.   ERAS Protcol -clears until 0530 PRE-SURGERY Ensure or G2-   COVID TEST- no    Anesthesia review:   Patient denies shortness of breath, fever, cough and chest pain over the phone call    Surgical Instructions    Your procedure is scheduled on December 18  Report to [facility] Main Entrance "A" at 0630 A.M., then check in with the Admitting office.  Call this number if you have problems the morning of surgery:  [phone]    Remember:  Do not eat after midnight the night before your surgery  You may drink clear liquids until 0530 the morning of your surgery.   Clear liquids allowed are: Water, Non-Citrus Juices (without pulp), Carbonated Beverages, Clear Tea, Black Coffee ONLY (NO MILK, CREAM OR POWDERED CREAMER of any kind), and Gatorade   Take these medicines the morning of surgery with A SIP OF WATER: Nitroglycerin if needed. Call [phone] if you need to take it.     As of today, STOP taking any Aspirin (unless otherwise instructed by your surgeon) Aleve, Naproxen, Ibuprofen, Motrin, Advil, Goody's, BC's, all herbal medications, fish oil, and all vitamins.  [facility] is not responsible for any belongings or valuables. .   Do NOT Smoke  (Tobacco/Vaping)  24 hours prior to your procedure  If you use a CPAP at night, you may bring your mask for your overnight stay.   Contacts, glasses, hearing aids, dentures or partials may not be worn into surgery, please bring cases for these belongings   Patients discharged the day of surgery will not be allowed to drive home, and someone needs to stay with them for 24 hours.   Please refer to the [facility] website for the visitor guidelines for Inpatients (after your surgery is over and you are in a regular room).     Special instructions:    Oral Hygiene is also important to reduce your risk of infection.  Remember - BRUSH YOUR TEETH THE MORNING OF SURGERY WITH YOUR REGULAR TOOTHPASTE   Day of Surgery:  Take a shower the day of or night before with antibacterial soap. Wear Clean/Comfortable clothing the morning of surgery Do not apply any deodorants/lotions.   Do not wear jewelry or makeup Do not wear lotions, powders, perfumes/colognes, or deodorant. Do not shave 48 hours prior to surgery.  Men may shave face and neck. Do not bring valuables to the hospital. Do not wear nail polish, gel polish, artificial nails, or any other type of covering on natural nails (fingers and toes) If you have artificial nails or gel coating that need to be removed by a nail salon, please have this removed prior to surgery. Artificial nails or gel coating may interfere with anesthesia's ability  to adequately monitor your vital signs. Remember to brush your teeth WITH YOUR REGULAR TOOTHPASTE.

## 2023-11-29 ENCOUNTER — Ambulatory Visit (HOSPITAL_COMMUNITY): Payer: BC Managed Care – PPO

## 2023-11-29 ENCOUNTER — Telehealth: Payer: Self-pay | Admitting: Orthopedic Surgery

## 2023-11-29 ENCOUNTER — Ambulatory Visit (HOSPITAL_COMMUNITY): Payer: BC Managed Care – PPO | Admitting: Anesthesiology

## 2023-11-29 ENCOUNTER — Other Ambulatory Visit: Payer: Self-pay

## 2023-11-29 ENCOUNTER — Ambulatory Visit (HOSPITAL_COMMUNITY)
Admission: RE | Admit: 2023-11-29 | Discharge: 2023-11-29 | Disposition: A | Discharge status: Home / Self Care | Payer: BC Managed Care – PPO | Attending: Orthopedic Surgery | Admitting: Orthopedic Surgery

## 2023-11-29 ENCOUNTER — Encounter (HOSPITAL_COMMUNITY)
Admission: RE | Disposition: A | Discharge status: Home / Self Care | Payer: Self-pay | Source: Home / Self Care | Attending: Orthopedic Surgery

## 2023-11-29 ENCOUNTER — Encounter (HOSPITAL_COMMUNITY): Payer: Self-pay | Admitting: Orthopedic Surgery

## 2023-11-29 DIAGNOSIS — M1990 Unspecified osteoarthritis, unspecified site: Secondary | ICD-10-CM | POA: Insufficient documentation

## 2023-11-29 DIAGNOSIS — Z7982 Long term (current) use of aspirin: Secondary | ICD-10-CM | POA: Diagnosis not present

## 2023-11-29 DIAGNOSIS — M205X1 Other deformities of toe(s) (acquired), right foot: Secondary | ICD-10-CM

## 2023-11-29 DIAGNOSIS — Z79899 Other long term (current) drug therapy: Secondary | ICD-10-CM | POA: Diagnosis not present

## 2023-11-29 DIAGNOSIS — F17210 Nicotine dependence, cigarettes, uncomplicated: Secondary | ICD-10-CM | POA: Insufficient documentation

## 2023-11-29 DIAGNOSIS — S93521A Sprain of metatarsophalangeal joint of right great toe, initial encounter: Secondary | ICD-10-CM | POA: Diagnosis not present

## 2023-11-29 DIAGNOSIS — I2510 Atherosclerotic heart disease of native coronary artery without angina pectoris: Secondary | ICD-10-CM | POA: Diagnosis not present

## 2023-11-29 HISTORY — DX: Atherosclerotic heart disease of native coronary artery without angina pectoris: I25.10

## 2023-11-29 HISTORY — PX: ARTHRODESIS METATARSALPHALANGEAL JOINT (MTPJ): SHX6566

## 2023-11-29 HISTORY — DX: Cardiac murmur, unspecified: R01.1

## 2023-11-29 HISTORY — DX: Other deformities of toe(s) (acquired), right foot: M20.5X1

## 2023-11-29 LAB — BASIC METABOLIC PANEL
Anion gap: 8 (ref 5–15)
BUN: 13 mg/dL (ref 8–23)
CO2: 22 mmol/L (ref 22–32)
Calcium: 8.8 mg/dL — ABNORMAL LOW (ref 8.9–10.3)
Chloride: 107 mmol/L (ref 98–111)
Creatinine, Ser: 0.97 mg/dL (ref 0.61–1.24)
GFR, Estimated: >60 mL/min (ref >60)
Glucose, Bld: 96 mg/dL (ref 70–99)
Potassium: 4 mmol/L (ref 3.5–5.1)
Sodium: 137 mmol/L (ref 135–145)

## 2023-11-29 LAB — CBC
HCT: 42.5 % (ref 39.0–52.0)
Hemoglobin: 14.3 g/dL (ref 13.0–17.0)
MCH: 32.1 pg (ref 26.0–34.0)
MCHC: 33.6 g/dL (ref 30.0–36.0)
MCV: 95.3 fL (ref 80.0–100.0)
Platelets: 276 10*3/uL (ref 150–400)
RBC: 4.46 MIL/uL (ref 4.22–5.81)
RDW: 14 % (ref 11.5–15.5)
WBC: 12.2 10*3/uL — ABNORMAL HIGH (ref 4.0–10.5)
nRBC: 0 % (ref 0.0–0.2)

## 2023-11-29 SURGERY — FUSION, JOINT, GREAT TOE
Anesthesia: Monitor Anesthesia Care | Site: Toe | Laterality: Right

## 2023-11-29 MED ORDER — PROPOFOL 10 MG/ML IV BOLUS
INTRAVENOUS | Status: DC | PRN
  Administered 2023-11-29: 50 mg via INTRAVENOUS

## 2023-11-29 MED ORDER — DROPERIDOL 2.5 MG/ML IJ SOLN: 0.6250 mg | Freq: Once | INTRAMUSCULAR | Status: DC | PRN

## 2023-11-29 MED ORDER — ACETAMINOPHEN 160 MG/5ML PO SOLN: 325.0000 mg | ORAL | Status: DC | PRN

## 2023-11-29 MED ORDER — CHLORHEXIDINE GLUCONATE 0.12 % MT SOLN
15.0000 mL | Freq: Once | OROMUCOSAL | Status: AC
  Administered 2023-11-29: 15 mL via OROMUCOSAL

## 2023-11-29 MED ORDER — CHLORHEXIDINE GLUCONATE 0.12 % MT SOLN
OROMUCOSAL | Status: AC
  Filled 2023-11-29: qty 15

## 2023-11-29 MED ORDER — ALBUTEROL SULFATE HFA 108 (90 BASE) MCG/ACT IN AERS
INHALATION_SPRAY | RESPIRATORY_TRACT | Status: DC | PRN
  Administered 2023-11-29: 3 via RESPIRATORY_TRACT

## 2023-11-29 MED ORDER — BUPIVACAINE-EPINEPHRINE (PF) 0.5% -1:200000 IJ SOLN
INTRAMUSCULAR | Status: DC | PRN
  Administered 2023-11-29: 20 mL via PERINEURAL

## 2023-11-29 MED ORDER — PROPOFOL 10 MG/ML IV BOLUS
INTRAVENOUS | Status: AC
  Filled 2023-11-29: qty 20

## 2023-11-29 MED ORDER — OXYCODONE HCL 5 MG/5ML PO SOLN
5.0000 mg | Freq: Once | ORAL | Status: DC | PRN
Start: 2023-11-29 — End: 2023-11-29

## 2023-11-29 MED ORDER — FENTANYL CITRATE (PF) 250 MCG/5ML IJ SOLN
INTRAMUSCULAR | Status: DC | PRN
  Administered 2023-11-29 (×2): 50 ug via INTRAVENOUS

## 2023-11-29 MED ORDER — MIDAZOLAM HCL 2 MG/2ML IJ SOLN
INTRAMUSCULAR | Status: DC | PRN
  Administered 2023-11-29 (×2): 1 mg via INTRAVENOUS

## 2023-11-29 MED ORDER — OXYCODONE HCL 5 MG PO TABS: 5.0000 mg | ORAL_TABLET | Freq: Once | ORAL | Status: DC | PRN

## 2023-11-29 MED ORDER — GLYCOPYRROLATE PF 0.2 MG/ML IJ SOSY
PREFILLED_SYRINGE | INTRAMUSCULAR | Status: DC | PRN
  Administered 2023-11-29: .1 mg via INTRAVENOUS

## 2023-11-29 MED ORDER — ORAL CARE MOUTH RINSE: 15.0000 mL | Freq: Once | OROMUCOSAL | Status: AC

## 2023-11-29 MED ORDER — ACETAMINOPHEN 10 MG/ML IV SOLN: 1000.0000 mg | Freq: Once | INTRAVENOUS | Status: DC | PRN

## 2023-11-29 MED ORDER — MIDAZOLAM HCL 2 MG/2ML IJ SOLN
INTRAMUSCULAR | Status: AC
  Filled 2023-11-29: qty 2

## 2023-11-29 MED ORDER — EPHEDRINE SULFATE-NACL 50-0.9 MG/10ML-% IV SOSY
PREFILLED_SYRINGE | INTRAVENOUS | Status: DC | PRN
  Administered 2023-11-29: 7.5 mg via INTRAVENOUS

## 2023-11-29 MED ORDER — CEFAZOLIN SODIUM-DEXTROSE 2-4 GM/100ML-% IV SOLN
INTRAVENOUS | Status: AC
  Filled 2023-11-29: qty 100

## 2023-11-29 MED ORDER — FENTANYL CITRATE (PF) 100 MCG/2ML IJ SOLN
INTRAMUSCULAR | Status: AC
  Filled 2023-11-29: qty 2

## 2023-11-29 MED ORDER — LIDOCAINE 2% (20 MG/ML) 5 ML SYRINGE
INTRAMUSCULAR | Status: DC | PRN
  Administered 2023-11-29: 60 mg via INTRAVENOUS

## 2023-11-29 MED ORDER — ONDANSETRON HCL 4 MG/2ML IJ SOLN
INTRAMUSCULAR | Status: DC | PRN
  Administered 2023-11-29: 4 mg via INTRAVENOUS

## 2023-11-29 MED ORDER — CEFAZOLIN SODIUM-DEXTROSE 2-4 GM/100ML-% IV SOLN: 2.0000 g | INTRAVENOUS | Status: DC

## 2023-11-29 MED ORDER — ROPIVACAINE HCL 5 MG/ML IJ SOLN
INTRAMUSCULAR | Status: DC | PRN
  Administered 2023-11-29: 10 mL via PERINEURAL

## 2023-11-29 MED ORDER — FENTANYL CITRATE (PF) 250 MCG/5ML IJ SOLN
INTRAMUSCULAR | Status: AC
  Filled 2023-11-29: qty 5

## 2023-11-29 MED ORDER — 0.9 % SODIUM CHLORIDE (POUR BTL) OPTIME
TOPICAL | Status: DC | PRN
  Administered 2023-11-29: 1000 mL

## 2023-11-29 MED ORDER — OXYCODONE HCL 5 MG PO TABS: 5.0000 mg | ORAL_TABLET | ORAL | 0 refills | Status: AC | PRN

## 2023-11-29 MED ORDER — FENTANYL CITRATE (PF) 100 MCG/2ML IJ SOLN: 25.0000 ug | INTRAMUSCULAR | Status: DC | PRN

## 2023-11-29 MED ORDER — SODIUM CHLORIDE 0.9 % IV SOLN
INTRAVENOUS | Status: DC
Start: 2023-11-29 — End: 2023-11-29

## 2023-11-29 MED ORDER — ACETAMINOPHEN 325 MG PO TABS: 325.0000 mg | ORAL_TABLET | ORAL | Status: DC | PRN

## 2023-11-29 MED ORDER — DEXAMETHASONE SODIUM PHOSPHATE 10 MG/ML IJ SOLN
INTRAMUSCULAR | Status: DC | PRN
  Administered 2023-11-29: 10 mg via INTRAVENOUS

## 2023-11-29 MED ORDER — PROPOFOL 500 MG/50ML IV EMUL
INTRAVENOUS | Status: DC | PRN
  Administered 2023-11-29: 100 ug/kg/min via INTRAVENOUS

## 2023-11-29 MED ORDER — KETOROLAC TROMETHAMINE 30 MG/ML IJ SOLN
INTRAMUSCULAR | Status: DC | PRN
  Administered 2023-11-29: 30 mg via INTRAVENOUS

## 2023-11-29 SURGICAL SUPPLY — 54 items
BAG COUNTER SPONGE SURGICOUNT (BAG) ×2 IMPLANT
BIT DRILL CALIBRATED 2.7 (BIT) IMPLANT
BLADE AVERAGE 25X9 (BLADE) IMPLANT
BLADE MINI RND TIP GREEN BEAV (BLADE) IMPLANT
BNDG COHESIVE 1X5 TAN STRL LF (GAUZE/BANDAGES/DRESSINGS) IMPLANT
BNDG COHESIVE 6X5 TAN NS LF (GAUZE/BANDAGES/DRESSINGS) IMPLANT
BNDG COHESIVE 6X5 TAN ST LF (GAUZE/BANDAGES/DRESSINGS) IMPLANT
BNDG ESMARK 4X9 LF (GAUZE/BANDAGES/DRESSINGS) ×2 IMPLANT
BNDG GAUZE DERMACEA FLUFF 4 (GAUZE/BANDAGES/DRESSINGS) IMPLANT
CORD BIPOLAR FORCEPS 12FT (ELECTRODE) ×2 IMPLANT
COTTON STERILE ROLL (GAUZE/BANDAGES/DRESSINGS) IMPLANT
COVER SURGICAL LIGHT HANDLE (MISCELLANEOUS) ×4 IMPLANT
CUFF TOURN SGL QUICK 18X4 (TOURNIQUET CUFF) IMPLANT
CUFF TOURN SGL QUICK 42 (TOURNIQUET CUFF) IMPLANT
CUFF TRNQT CYL 24X4X16.5-23 (TOURNIQUET CUFF) IMPLANT
CUFF TRNQT CYL 34X4.125X (TOURNIQUET CUFF) IMPLANT
DRAPE OEC MINIVIEW 54X84 (DRAPES) IMPLANT
DRAPE U-SHAPE 47X51 STRL (DRAPES) ×2 IMPLANT
DRSG ADAPTIC 3X8 NADH LF (GAUZE/BANDAGES/DRESSINGS) IMPLANT
DRSG DERMACEA NONADH 3X8 (GAUZE/BANDAGES/DRESSINGS) IMPLANT
DURAPREP 26ML APPLICATOR (WOUND CARE) ×2 IMPLANT
ELECT REM PT RETURN 9FT ADLT (ELECTROSURGICAL) ×1 IMPLANT
ELECTRODE REM PT RTRN 9FT ADLT (ELECTROSURGICAL) ×2 IMPLANT
GAUZE SPONGE 2X2 8PLY STRL LF (GAUZE/BANDAGES/DRESSINGS) IMPLANT
GAUZE SPONGE 4X4 12PLY STRL (GAUZE/BANDAGES/DRESSINGS) IMPLANT
GLOVE BIOGEL PI IND STRL 9 (GLOVE) ×2 IMPLANT
GLOVE SURG ORTHO 9.0 STRL STRW (GLOVE) ×2 IMPLANT
GOWN STRL REUS W/ TWL XL LVL3 (GOWN DISPOSABLE) ×4 IMPLANT
K-WIRE ACE 1.6X6 (WIRE) ×2 IMPLANT
KIT BASIN OR (CUSTOM PROCEDURE TRAY) ×2 IMPLANT
KIT TURNOVER KIT B (KITS) ×2 IMPLANT
KWIRE ACE 1.6X6 (WIRE) IMPLANT
MANIFOLD NEPTUNE II (INSTRUMENTS) ×2 IMPLANT
NDL HYPO 25GX1X1/2 BEV (NEEDLE) IMPLANT
NEEDLE HYPO 25GX1X1/2 BEV (NEEDLE) IMPLANT
NS IRRIG 1000ML POUR BTL (IV SOLUTION) ×2 IMPLANT
PACK ORTHO EXTREMITY (CUSTOM PROCEDURE TRAY) ×2 IMPLANT
PAD ABD 8X10 STRL (GAUZE/BANDAGES/DRESSINGS) IMPLANT
PAD ARMBOARD 7.5X6 YLW CONV (MISCELLANEOUS) ×4 IMPLANT
PAD CAST 4YDX4 CTTN HI CHSV (CAST SUPPLIES) IMPLANT
PLATE LG 1ST RT MTP FUSION (Plate) IMPLANT
SCREW LOCK CORT STAR 3.5X14 (Screw) IMPLANT
SCREW LOCK CORT STAR 3.5X16 (Screw) IMPLANT
SCREW NON LOCKING LP 3.5 14MM (Screw) IMPLANT
SCREW NON LOCKING LP 3.5 16MM (Screw) IMPLANT
SPECIMEN JAR SMALL (MISCELLANEOUS) ×2 IMPLANT
SUCTION TUBE FRAZIER 10FR DISP (SUCTIONS) IMPLANT
SUT ETHILON 2 0 PSLX (SUTURE) IMPLANT
SUT VIC AB 2-0 FS1 27 (SUTURE) IMPLANT
TOWEL GREEN STERILE (TOWEL DISPOSABLE) ×2 IMPLANT
TOWEL GREEN STERILE FF (TOWEL DISPOSABLE) ×2 IMPLANT
TUBE CONNECTING 12X1/4 (SUCTIONS) IMPLANT
WATER STERILE IRR 1000ML POUR (IV SOLUTION) ×2 IMPLANT
YANKAUER SUCT BULB TIP NO VENT (SUCTIONS) IMPLANT

## 2023-11-29 NOTE — Anesthesia Preprocedure Evaluation (Addendum)
Anesthesia Evaluation  Patient identified by MRN, date of birth, ID band Patient awake    Reviewed: Allergy & Precautions, NPO status , Patient's Chart, lab work & pertinent test results  Airway Mallampati: II  TM Distance: >3 FB Neck ROM: Full    Dental  (+) Teeth Intact, Dental Advisory Given   Pulmonary Current Smoker   breath sounds clear to auscultation       Cardiovascular + CAD  + Valvular Problems/Murmurs  Rhythm:Regular Rate:Normal     Neuro/Psych negative neurological ROS  negative psych ROS   GI/Hepatic negative GI ROS, Neg liver ROS,,,  Endo/Other  negative endocrine ROS    Renal/GU negative Renal ROS     Musculoskeletal  (+) Arthritis ,    Abdominal   Peds  Hematology negative hematology ROS (+)   Anesthesia Other Findings   Reproductive/Obstetrics                             Anesthesia Physical Anesthesia Plan  ASA: 2  Anesthesia Plan: MAC   Post-op Pain Management: Tylenol PO (pre-op)*, Toradol IV (intra-op)* and Regional block*   Induction: Intravenous  PONV Risk Score and Plan: 2 and Ondansetron, Dexamethasone and Midazolam  Airway Management Planned: LMA  Additional Equipment: None  Intra-op Plan:   Post-operative Plan: Extubation in OR  Informed Consent: I have reviewed the patients History and Physical, chart, labs and discussed the procedure including the risks, benefits and alternatives for the proposed anesthesia with the patient or authorized representative who has indicated his/her understanding and acceptance.     Dental advisory given  Plan Discussed with: CRNA  Anesthesia Plan Comments:        Anesthesia Quick Evaluation

## 2023-11-29 NOTE — H&P (Signed)
Joel Bailey is an 67 y.o. male.   Chief Complaint: Pain right great toe MTP joint. HPI: Patient is a 67 year old gentleman who is 3 years status post right tibial calcaneal fusion he is in a fracture boot. Patient does have double upright braces but states that he has trouble with his toe folding under trying to get into the shoe.   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/07/2017   Lumbar stenosis with neurogenic claudication 12/07/2017   Osteoarthritis    Pedal edema 08/01/2016   right sided     Tobacco abuse disorder    Unstable ankle, right 12/23/2022    Past Surgical History:  Procedure Laterality Date   ANTERIOR CERVICAL DECOMPRESSION/DISCECTOMY FUSION 4 LEVELS N/A 08/07/2017   Procedure: ANTERIOR CERVICAL DECOMPRESSION/DISCECTOMY FUSION CERVICAL 3- CERVICAL 4, CERVICAL 4 - CERVICAL 5, CERVICAL 5- CERVICAL 6, CERVICAL 6- CERVICAL 7;  Surgeon: Shirlean Kelly, MD;  Location: [facility] OR;  Service: Neurosurgery;  Laterality: N/A;   BACK SURGERY  2017   FOOT ARTHRODESIS Right 12/23/2022   Procedure: RIGHT TIBIOCALCANEAL FUSION;  Surgeon: Nadara Mustard, MD;  Location: [facility] OR;  Service: Orthopedics;  Laterality: Right;    Family History  Problem Relation Age of Onset   Diabetes Mother    Heart attack Mother    Heart disease Mother    Diabetes Sister    Heart disease Sister    Diabetes Sister    Diabetes Brother    Heart disease Brother    Social History:  reports that he has been smoking cigarettes. He has a 52 pack-year smoking history. He has never used smokeless tobacco. He reports that he does  not drink alcohol and does not use drugs.  Allergies: No Known Allergies  Medications Prior to Admission  Medication Sig Dispense Refill   aspirin EC 81 MG tablet Take 81 mg by mouth daily. Swallow whole.     nitroGLYCERIN (NITROSTAT) 0.4 MG SL tablet Place 1 tablet (0.4 mg total) under the tongue every 5 (five) minutes as needed. 25 tablet 6   rosuvastatin (CRESTOR) 10 MG tablet Take 1 tablet (10 mg total) by mouth daily. 90 tablet 3    No results found for this or any previous visit (from the past 48 hours). No results found.  Review of Systems  All other systems reviewed and are negative.   There were no vitals taken for this visit. Physical Exam  Patient is alert, oriented, no adenopathy, well-dressed, normal affect, normal respiratory effort. Patient has a palpable dorsalis pedis pulse he has weakness with EHL strength of the right great toe he cannot actively extend his toe.  Patient's tibial calcaneal fusion is stable. Assessment/Plan Assessment: Instability right great toe MTP joint.  Plan: Will plan for fusion of the right great toe MTP joint.  Risks and benefits were discussed including infection nonunion and need for additional surgery.  Patient states he understands wished to proceed at this time.  Nadara Mustard, MD 11/29/2023, 6:34 AM

## 2023-11-29 NOTE — Telephone Encounter (Signed)
Patient called. Says he needs a note to be out until 01/01/2024. His call back number is [phone]. Would like the note given to [name] in [place].

## 2023-11-29 NOTE — Op Note (Signed)
11/29/2023  9:57 AM  PATIENT:  Joel Bailey    PRE-OPERATIVE DIAGNOSIS:  Laxity Right Great Toe with clawing.  POST-OPERATIVE DIAGNOSIS:  Same  PROCEDURE:  RIGHT GREAT TOE METATARSALPHALANGEAL JOINT (MTPJ) FUSION C arm fluoroscopy to verify reduction.  SURGEON:  Nadara Mustard, MD  PHYSICIAN ASSISTANT:None ANESTHESIA:   General  PREOPERATIVE INDICATIONS:  [name] is a  67 y.o. male with a diagnosis of Laxity Right Great Toe who failed conservative measures and elected for surgical management.    The risks benefits and alternatives were discussed with the patient preoperatively including but not limited to the risks of infection, bleeding, nerve injury, cardiopulmonary complications, the need for revision surgery, among others, and the patient was willing to proceed.  OPERATIVE IMPLANTS:   Implant Name Type Inv. Item Serial No. Manufacturer Lot No. LRB No. Used Action  PLATE LG 1ST RT MTP FUSION - [id number] Plate PLATE LG 1ST RT MTP FUSION  [employer](ORTH,TRAU,BIO,SG)  Right 1 Implanted  SCREW LOCK CORT STAR 3.5X14 - [id number] Screw SCREW LOCK CORT STAR 3.5X14  [employer](ORTH,TRAU,BIO,SG)  Right 1 Implanted  SCREW LOCK CORT STAR 3.5X16 - [id number] Screw SCREW LOCK CORT STAR 3.5X16  [employer](ORTH,TRAU,BIO,SG)  Right 4 Implanted    @ENCIMAGES @  OPERATIVE FINDINGS: Simplastic verified reduction of the fusion.  OPERATIVE PROCEDURE: Patient is brought the operating room and underwent a regional anesthetic.  After adequate levels anesthesia obtained patient's right lower extremity was prepped using DuraPrep draped into a sterile field a timeout was called.  A medial longitudinal incision was made over the MTP joint.  This was carried down to the retinaculum which was elevated and subperiosteal dissection was used to expose the MTP joint.  A guidewire was inserted into the metatarsal head and a 20 mm cup reamer was used to debride back to bleeding viable subchondral  bone.  The wound was irrigated the pin was removed and the pin was inserted into the proximal phalanx base.  A 20 mm cone reamer was used to debride down to bleeding viable subchondral bone.  The wound was irrigated the joint was reduced and a fusion plate was placed dorsally.  1 screw was placed dorsally proximally.  The joint was compressed and a distal compression screw was placed.  The joint alignment was checked the first webspace was lined up and the remaining screws were placed with locking screws.  See en face be verified alignment.  The wound was irrigated with normal saline.  The retinaculum was closed using 2-0 Vicryl skin was closed using 2-0 nylon sterile dressing was applied patient was taken the PACU in stable condition.   DISCHARGE PLANNING:  Antibiotic duration: Preoperative antibiotics  Weightbearing: Touchdown weightbearing on the right  Pain medication: Prescription for Percocet  Dressing care/ Wound VAC: Dry dressing  Ambulatory devices: Crutches and Cam boot walker  Discharge to: Home.  Follow-up: In the office 1 week post operative.

## 2023-11-29 NOTE — Anesthesia Procedure Notes (Signed)
Anesthesia Regional Block: Adductor canal block   Pre-Anesthetic Checklist: , timeout performed,  Correct Patient, Correct Site, Correct Laterality,  Correct Procedure, Correct Position, site marked,  Risks and benefits discussed,  Surgical consent,  Pre-op evaluation,  At surgeon's request and post-op pain management  Laterality: Right  Prep: chloraprep       Needles:  Injection technique: Single-shot  Needle Type: Echogenic Stimulator Needle     Needle Length: 9cm  Needle Gauge: 21     Additional Needles:   Procedures:,,,, ultrasound used (permanent image in chart),,    Narrative:  Start time: 11/29/2023 8:15 AM End time: 11/29/2023 8:20 AM Injection made incrementally with aspirations every 5 mL.  Performed by: Personally  Anesthesiologist: Shelton Silvas, MD  Additional Notes: Discussed risks and benefits of the nerve block in detail, including but not limited vascular injury, permanent nerve damage and infection.   Patient tolerated the procedure well. Local anesthetic introduced in an incremental fashion under minimal resistance after negative aspirations. No paresthesias were elicited. After completion of the procedure, no acute issues were identified and patient continued to be monitored by RN.

## 2023-11-29 NOTE — Transfer of Care (Signed)
Immediate Anesthesia Transfer of Care Note  Patient: Joel Bailey  Procedure(s) Performed: RIGHT GREAT TOE METATARSALPHALANGEAL JOINT (MTPJ) FUSION (Right: Toe)  Patient Location: PACU  Anesthesia Type:MAC  Level of Consciousness: awake, alert , and oriented  Airway & Oxygen Therapy: Patient Spontanous Breathing and Patient connected to face mask oxygen  Post-op Assessment: Report given to RN and Post -op Vital signs reviewed and stable  Post vital signs: Reviewed and stable  Last Vitals:  Vitals Value Taken Time  BP 106/69 11/29/23 0939  Temp 36.5 C 11/29/23 0939  Pulse 65 11/29/23 0943  Resp 17 11/29/23 0943  SpO2 96 % 11/29/23 0943  Vitals shown include unfiled device data.  Last Pain:  Vitals:   11/29/23 0650  TempSrc:   PainSc: 6       Patients Stated Pain Goal: 0 (11/29/23 0650)  Complications: No notable events documented.

## 2023-11-29 NOTE — Anesthesia Postprocedure Evaluation (Signed)
Anesthesia Post Note  Patient: Joel Bailey  Procedure(s) Performed: RIGHT GREAT TOE METATARSALPHALANGEAL JOINT (MTPJ) FUSION (Right: Toe)     Patient location during evaluation: PACU Anesthesia Type: MAC Level of consciousness: awake and alert Pain management: pain level controlled Vital Signs Assessment: post-procedure vital signs reviewed and stable Respiratory status: spontaneous breathing, nonlabored ventilation, respiratory function stable and patient connected to nasal cannula oxygen Cardiovascular status: stable and blood pressure returned to baseline Postop Assessment: no apparent nausea or vomiting Anesthetic complications: no  No notable events documented.  Last Vitals:  Vitals:   11/29/23 0945 11/29/23 1000  BP: 114/69 127/72  Pulse: 61 (!) 50  Resp: 13 12  Temp:  36.9 C  SpO2: 95% 95%    Last Pain:  Vitals:   11/29/23 1000  TempSrc:   PainSc: 0-No pain                 Shelton Silvas

## 2023-11-29 NOTE — Anesthesia Procedure Notes (Signed)
Anesthesia Regional Block: Popliteal block   Pre-Anesthetic Checklist: , timeout performed,  Correct Patient, Correct Site, Correct Laterality,  Correct Procedure, Correct Position, site marked,  Risks and benefits discussed,  Surgical consent,  Pre-op evaluation,  At surgeon's request and post-op pain management  Laterality: Right  Prep: chloraprep       Needles:  Injection technique: Single-shot  Needle Type: Echogenic Stimulator Needle     Needle Length: 9cm  Needle Gauge: 21     Additional Needles:   Procedures:,,,, ultrasound used (permanent image in chart),,    Narrative:  Start time: 11/29/2023 8:10 AM End time: 11/29/2023 8:15 AM Injection made incrementally with aspirations every 5 mL.  Performed by: Personally  Anesthesiologist: Shelton Silvas, MD  Additional Notes: Discussed risks and benefits of the nerve block in detail, including but not limited vascular injury, permanent nerve damage and infection.   Patient tolerated the procedure well. Local anesthetic introduced in an incremental fashion under minimal resistance after negative aspirations. No paresthesias were elicited. After completion of the procedure, no acute issues were identified and patient continued to be monitored by RN.

## 2023-11-29 NOTE — Progress Notes (Signed)
Orthopedic Tech Progress Note Patient Details:  Joel Bailey 03-03-1956 [id number] CAM Dan Humphreys was delivered to bedside. Patient stated that he could apply the boot on his own.  Ortho Devices Type of Ortho Device: CAM walker Ortho Device/Splint Location: RLE Ortho Device/Splint Interventions: Ordered      Javaya [place] E Kamden Reber 11/29/2023, 10:29 AM

## 2023-11-30 ENCOUNTER — Encounter (HOSPITAL_COMMUNITY): Payer: Self-pay | Admitting: Orthopedic Surgery

## 2023-11-30 NOTE — Telephone Encounter (Signed)
Updated form and faxed with work note

## 2023-12-12 ENCOUNTER — Other Ambulatory Visit (INDEPENDENT_AMBULATORY_CARE_PROVIDER_SITE_OTHER): Payer: Self-pay

## 2023-12-12 ENCOUNTER — Telehealth: Payer: Self-pay | Admitting: Radiology

## 2023-12-12 ENCOUNTER — Encounter: Payer: Self-pay | Admitting: Family

## 2023-12-12 ENCOUNTER — Ambulatory Visit (INDEPENDENT_AMBULATORY_CARE_PROVIDER_SITE_OTHER): Payer: BC Managed Care – PPO | Admitting: Family

## 2023-12-12 DIAGNOSIS — M79671 Pain in right foot: Secondary | ICD-10-CM | POA: Diagnosis not present

## 2023-12-12 NOTE — Telephone Encounter (Signed)
Patient was seen today. He is currently written out of work until January 20th. Patient is wanting to know if he will remain out of work longer. Advised we will call him back with answer.

## 2023-12-12 NOTE — Telephone Encounter (Signed)
Called patient

## 2023-12-12 NOTE — Progress Notes (Signed)
 Post-Op Visit Note   Patient: Joel Bailey           Date of Birth: 1956/11/17           MRN: [id number] Visit Date: 12/12/2023 PCP: Conley Dene BROCKS, DO  Chief Complaint:  Chief Complaint  Patient presents with   Right Foot - Routine Post Op    11/29/23 Right GT MTPJ fusion    HPI:  HPI The patient is a 67 year old gentleman seen status post right great toe metatarsophalangeal joint fusion December 18. Ortho Exam On examination right foot the incision is clean dry and intact.  Sutures are in place.  Visit Diagnoses:  1. Right foot pain     Plan: Sutures harvested today without incident.  He will begin daily dose of cleansing.  May begin full weightbearing in his cam boot.  Follow-Up Instructions: Return in about 2 weeks (around 12/26/2023).   Imaging: No results found.  Orders:  Orders Placed This Encounter  Procedures   XR Foot Complete Right   No orders of the defined types were placed in this encounter.    PMFS History: Patient Active Problem List   Diagnosis Date Noted   Claw toe, acquired, right 11/29/2023   Coronary artery calcification seen on CT scan 10/11/2023   Cigarette smoker 10/11/2023   Cardiac murmur 10/11/2023   Arterial atherosclerosis    Arthritis    Osteoarthritis    Tobacco abuse disorder    Unstable ankle, right 12/23/2022   Cavovarus deformity of foot, acquired, right 12/23/2022   Ankle weakness 02/16/2021   Acquired varus deformity of right foot 11/27/2018   Lumbar stenosis with neurogenic claudication 12/07/2017   HNP (herniated nucleus pulposus) with myelopathy, cervical 08/07/2017   Cigarette nicotine dependence without complication 08/01/2016   Erectile dysfunction 08/01/2016   Pedal edema 08/01/2016   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    Cigarette nicotine dependence without complication 08/01/2016   Coronary artery disease    Erectile dysfunction 08/01/2016   Heart murmur    HNP (herniated nucleus pulposus) with myelopathy, cervical 08/07/2017   Lumbar stenosis with neurogenic claudication 12/07/2017   Osteoarthritis    Pedal edema 08/01/2016   right sided     Tobacco abuse disorder    Unstable ankle, right 12/23/2022    Family History  Problem Relation Age of Onset   Diabetes Mother    Heart attack Mother    Heart disease Mother    Diabetes Sister    Heart disease Sister    Diabetes Sister    Diabetes Brother    Heart disease Brother     Past Surgical History:  Procedure Laterality Date   ANTERIOR CERVICAL DECOMPRESSION/DISCECTOMY FUSION 4 LEVELS N/A 08/07/2017   Procedure: ANTERIOR CERVICAL DECOMPRESSION/DISCECTOMY FUSION CERVICAL 3- CERVICAL 4, CERVICAL 4 - CERVICAL 5, CERVICAL 5- CERVICAL 6, CERVICAL 6- CERVICAL 7;  Surgeon: Alix Charleston, MD;  Location: [facility] OR;  Service: Neurosurgery;  Laterality: N/A;   ARTHRODESIS METATARSALPHALANGEAL JOINT (MTPJ) Right 11/29/2023   Procedure: RIGHT GREAT TOE METATARSALPHALANGEAL JOINT (MTPJ) FUSION;  Surgeon: Harden Jerona GAILS, MD;  Location: [facility] OR;  Service: Orthopedics;  Laterality: Right;   BACK SURGERY  2017   FOOT ARTHRODESIS Right 12/23/2022   Procedure: RIGHT TIBIOCALCANEAL FUSION;  Surgeon: Harden Jerona GAILS, MD;  Location: [facility]  OR;  Service: Orthopedics;  Laterality: Right;   Social History   Occupational History   Not on file  Tobacco Use   Smoking status: Every Day    Current packs/day: 1.00    Average packs/day: 1 pack/day for 52.0 years (52.0 ttl pk-yrs)    Types: Cigarettes   Smokeless tobacco: Never  Vaping Use   Vaping status: Never Used  Substance and Sexual Activity   Alcohol use: No   Drug use: No   Sexual activity: Not on file

## 2023-12-12 NOTE — Telephone Encounter (Signed)
Possibly. Can we address this at his 1/14 visit?

## 2023-12-26 ENCOUNTER — Encounter: Payer: Self-pay | Admitting: Family

## 2023-12-26 ENCOUNTER — Ambulatory Visit (INDEPENDENT_AMBULATORY_CARE_PROVIDER_SITE_OTHER): Payer: BC Managed Care – PPO | Admitting: Family

## 2023-12-26 DIAGNOSIS — M205X1 Other deformities of toe(s) (acquired), right foot: Secondary | ICD-10-CM

## 2023-12-26 NOTE — Progress Notes (Signed)
 Post-Op Visit Note   Patient: Joel Bailey           Date of Birth: Aug 27, 1956           MRN: [id number] Visit Date: 12/26/2023 PCP: Conley Dene BROCKS, DO  Chief Complaint:  Chief Complaint  Patient presents with   Right Foot - Follow-up    HPI:  HPI The patient is a 68 year old gentleman seen status post right great toe metatarsophalangeal joint fusion he has been ambulating with crutches in his cam walker he has been trying to only bear weight on his heel. Ortho Exam On examination right foot the incision is well-healed there is minimal edema and erythema there is no cellulitis no warmth  Visit Diagnoses: No diagnosis found.  Plan: May advance weightbearing as tolerated in the cam boot he may advance to his orthosis as he feels comfortable  Follow-Up Instructions: No follow-ups on file.   Imaging: No results found.  Orders:  No orders of the defined types were placed in this encounter.  No orders of the defined types were placed in this encounter.    PMFS History: Patient Active Problem List   Diagnosis Date Noted   Claw toe, acquired, right 11/29/2023   Coronary artery calcification seen on CT scan 10/11/2023   Cigarette smoker 10/11/2023   Cardiac murmur 10/11/2023   Arterial atherosclerosis    Arthritis    Osteoarthritis    Tobacco abuse disorder    Unstable ankle, right 12/23/2022   Cavovarus deformity of foot, acquired, right 12/23/2022   Ankle weakness 02/16/2021   Acquired varus deformity of right foot 11/27/2018   Lumbar stenosis with neurogenic claudication 12/07/2017   HNP (herniated nucleus pulposus) with myelopathy, cervical 08/07/2017   Cigarette nicotine dependence without complication 08/01/2016   Erectile dysfunction 08/01/2016   Pedal edema 08/01/2016   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   Ankle weakness 02/16/2021   Arterial atherosclerosis    Arthritis    Cavovarus deformity  of foot, acquired, right 12/23/2022   Cigarette nicotine dependence without complication 08/01/2016   Coronary artery disease    Erectile dysfunction 08/01/2016   Heart murmur    HNP (herniated nucleus pulposus) with myelopathy, cervical 08/07/2017   Lumbar stenosis with neurogenic claudication 12/07/2017   Osteoarthritis    Pedal edema 08/01/2016   right sided     Tobacco abuse disorder    Unstable ankle, right 12/23/2022    Family History  Problem Relation Age of Onset   Diabetes Mother    Heart attack Mother    Heart disease Mother    Diabetes Sister    Heart disease Sister    Diabetes Sister    Diabetes Brother    Heart disease Brother     Past Surgical History:  Procedure Laterality Date   ANTERIOR CERVICAL DECOMPRESSION/DISCECTOMY FUSION 4 LEVELS N/A 08/07/2017   Procedure: ANTERIOR CERVICAL DECOMPRESSION/DISCECTOMY FUSION CERVICAL 3- CERVICAL 4, CERVICAL 4 - CERVICAL 5, CERVICAL 5- CERVICAL 6, CERVICAL 6- CERVICAL 7;  Surgeon: Alix Charleston, MD;  Location: [facility] OR;  Service: Neurosurgery;  Laterality: N/A;   ARTHRODESIS METATARSALPHALANGEAL JOINT (MTPJ) Right 11/29/2023   Procedure: RIGHT GREAT TOE METATARSALPHALANGEAL JOINT (MTPJ) FUSION;  Surgeon: Harden Jerona GAILS, MD;  Location: [facility] OR;  Service: Orthopedics;  Laterality: Right;   BACK SURGERY  2017   FOOT ARTHRODESIS Right 12/23/2022   Procedure: RIGHT TIBIOCALCANEAL FUSION;  Surgeon: Harden Jerona GAILS, MD;  Location: [facility]  OR;  Service: Orthopedics;  Laterality: Right;   Social History   Occupational History   Not on file  Tobacco Use   Smoking status: Every Day    Current packs/day: 1.00    Average packs/day: 1 pack/day for 52.0 years (52.0 ttl pk-yrs)    Types: Cigarettes   Smokeless tobacco: Never  Vaping Use   Vaping status: Never Used  Substance and Sexual Activity   Alcohol use: No   Drug use: No   Sexual activity: Not on file

## 2024-01-09 ENCOUNTER — Encounter: Payer: Self-pay | Admitting: Family

## 2024-01-09 ENCOUNTER — Ambulatory Visit: Payer: BC Managed Care – PPO | Admitting: Family

## 2024-01-09 DIAGNOSIS — M19071 Primary osteoarthritis, right ankle and foot: Secondary | ICD-10-CM

## 2024-01-09 DIAGNOSIS — M205X1 Other deformities of toe(s) (acquired), right foot: Secondary | ICD-10-CM

## 2024-01-09 NOTE — Progress Notes (Signed)
Post-Op Visit Note   Patient: Joel Bailey           Date of Birth: 1955/12/20           MRN: [id number] Visit Date: 01/09/2024 PCP: Annamaria Helling, DO  Chief Complaint:  Chief Complaint  Patient presents with   Right Foot - Follow-up    HPI:  HPI The patient is a 68 year old gentleman seen status post right great toe metatarsophalangeal joint fusion he has been ambulating  Full weight bearing in his CAM  Ortho Exam On examination right foot the incision is well-healed there is minimal edema and erythema. there is no cellulitis no warmth  Visit Diagnoses:  1. Arthritis of right subtalar joint   2. Claw toe, acquired, right     Plan: May advance weightbearing as tolerated in his orthosis as he feels comfortable. Begin PT. Follow up with duda in 4 weeks  Follow-Up Instructions: No follow-ups on file.   Imaging: No results found.  Orders:  Orders Placed This Encounter  Procedures   Ambulatory referral to Physical Therapy   No orders of the defined types were placed in this encounter.    PMFS History: Patient Active Problem List   Diagnosis Date Noted   Claw toe, acquired, right 11/29/2023   Coronary artery calcification seen on CT scan 10/11/2023   Cigarette smoker 10/11/2023   Cardiac murmur 10/11/2023   Arterial atherosclerosis    Arthritis    Osteoarthritis    Tobacco abuse disorder    Unstable ankle, right 12/23/2022   Cavovarus deformity of foot, acquired, right 12/23/2022   Ankle weakness 02/16/2021   Acquired varus deformity of right foot 11/27/2018   Lumbar stenosis with neurogenic claudication 12/07/2017   HNP (herniated nucleus pulposus) with myelopathy, cervical 08/07/2017   Cigarette nicotine dependence without complication 08/01/2016   Erectile dysfunction 08/01/2016   Pedal edema 08/01/2016   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   Ankle weakness 02/16/2021   Arterial  atherosclerosis    Arthritis    Cavovarus deformity of foot, acquired, right 12/23/2022   Cigarette nicotine dependence without complication 08/01/2016   Coronary artery disease    Erectile dysfunction 08/01/2016   Heart murmur    HNP (herniated nucleus pulposus) with myelopathy, cervical 08/07/2017   Lumbar stenosis with neurogenic claudication 12/07/2017   Osteoarthritis    Pedal edema 08/01/2016   right sided     Tobacco abuse disorder    Unstable ankle, right 12/23/2022    Family History  Problem Relation Age of Onset   Diabetes Mother    Heart attack Mother    Heart disease Mother    Diabetes Sister    Heart disease Sister    Diabetes Sister    Diabetes Brother    Heart disease Brother     Past Surgical History:  Procedure Laterality Date   ANTERIOR CERVICAL DECOMPRESSION/DISCECTOMY FUSION 4 LEVELS N/A 08/07/2017   Procedure: ANTERIOR CERVICAL DECOMPRESSION/DISCECTOMY FUSION CERVICAL 3- CERVICAL 4, CERVICAL 4 - CERVICAL 5, CERVICAL 5- CERVICAL 6, CERVICAL 6- CERVICAL 7;  Surgeon: Shirlean Kelly, MD;  Location: [facility] OR;  Service: Neurosurgery;  Laterality: N/A;   ARTHRODESIS METATARSALPHALANGEAL JOINT (MTPJ) Right 11/29/2023   Procedure: RIGHT GREAT TOE METATARSALPHALANGEAL JOINT (MTPJ) FUSION;  Surgeon: Nadara Mustard, MD;  Location: [facility] OR;  Service: Orthopedics;  Laterality: Right;   BACK SURGERY  2017   FOOT ARTHRODESIS Right 12/23/2022   Procedure: RIGHT TIBIOCALCANEAL  FUSION;  Surgeon: Nadara Mustard, MD;  Location: [facility] OR;  Service: Orthopedics;  Laterality: Right;   Social History   Occupational History   Not on file  Tobacco Use   Smoking status: Every Day    Current packs/day: 1.00    Average packs/day: 1 pack/day for 52.0 years (52.0 ttl pk-yrs)    Types: Cigarettes   Smokeless tobacco: Never  Vaping Use   Vaping status: Never Used  Substance and Sexual Activity   Alcohol use: No   Drug use: No   Sexual activity: Not on file

## 2024-01-12 NOTE — Therapy (Signed)
 OUTPATIENT PHYSICAL THERAPY LOWER EXTREMITY EVALUATION   Patient Name: Joel Bailey MRN: [id number] DOB:1955-12-14, 68 y.o., male Today's Date: 01/17/2024  END OF SESSION:  PT End of Session - 01/17/24 1540     Visit Number 1    Number of Visits 19    Date for PT Re-Evaluation 03/13/24    Authorization Type BCBS    Authorization - Number of Visits 25    PT Start Time 1058    PT Stop Time 1143    PT Time Calculation (min) 45 min    Activity Tolerance Patient tolerated treatment well;No increased pain    Behavior During Therapy [facility] for tasks assessed/performed             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/07/2017   Lumbar stenosis with neurogenic claudication 12/07/2017   Osteoarthritis    Pedal edema 08/01/2016   right sided     Tobacco abuse disorder    Unstable ankle, right 12/23/2022   Past Surgical History:  Procedure Laterality Date   ANTERIOR CERVICAL DECOMPRESSION/DISCECTOMY FUSION 4 LEVELS N/A 08/07/2017   Procedure: ANTERIOR CERVICAL DECOMPRESSION/DISCECTOMY FUSION CERVICAL 3- CERVICAL 4, CERVICAL 4 - CERVICAL 5, CERVICAL 5- CERVICAL 6, CERVICAL 6- CERVICAL 7;  Surgeon: Alix Charleston, MD;  Location: [facility] OR;  Service: Neurosurgery;  Laterality: N/A;   ARTHRODESIS METATARSALPHALANGEAL JOINT (MTPJ) Right 11/29/2023   Procedure: RIGHT GREAT TOE METATARSALPHALANGEAL JOINT (MTPJ) FUSION;  Surgeon: Harden Jerona GAILS, MD;  Location: [facility] OR;  Service: Orthopedics;  Laterality: Right;   BACK SURGERY  2017   FOOT ARTHRODESIS Right 12/23/2022   Procedure: RIGHT TIBIOCALCANEAL FUSION;   Surgeon: Harden Jerona GAILS, MD;  Location: [facility] OR;  Service: Orthopedics;  Laterality: Right;   Patient Active Problem List   Diagnosis Date Noted   Claw toe, acquired, right 11/29/2023   Coronary artery calcification seen on CT scan 10/11/2023   Cigarette smoker 10/11/2023   Cardiac murmur 10/11/2023   Arterial atherosclerosis    Arthritis    Osteoarthritis    Tobacco abuse disorder    Unstable ankle, right 12/23/2022   Cavovarus deformity of foot, acquired, right 12/23/2022   Ankle weakness 02/16/2021   Acquired varus deformity of right foot 11/27/2018   Lumbar stenosis with neurogenic claudication 12/07/2017   HNP (herniated nucleus pulposus) with myelopathy, cervical 08/07/2017   Cigarette nicotine dependence without complication 08/01/2016   Erectile dysfunction 08/01/2016   Pedal edema 08/01/2016    PCP: Dene JAYSON Livings, DO  REFERRING PROVIDER: Rocky JONELLE Hans, NP  REFERRING DIAG:  Diagnosis  M19.071 (ICD-10-CM) - Arthritis of right subtalar joint  M20.5X1 (ICD-10-CM) - Claw toe, acquired, right    THERAPY DIAG:  Difficulty in walking, not elsewhere classified  Localized edema  Muscle weakness (generalized)  Stiffness of right foot, not elsewhere classified  Stiffness of right ankle, not elsewhere classified  Rationale for Evaluation and Treatment: Rehabilitation  ONSET DATE: January 11 of 2024  SUBJECTIVE:   SUBJECTIVE STATEMENT: Azaan had a back surgery in 2017 with right sided weakness.  He believes walking around with the right sided weakness and paresthesias led to  his right foot and ankle issues.  He has paresthesias of the entire right foot since December 22, 2022.  He has been in a walking boot for over a year.  PERTINENT HISTORY: Varus RT foot, OA, CAD, cervical HNP/ACDF C4-7, lumbar stenosis/surgery, unstable right ankle, Rt MTP joint fusion 11/29/2023, Rt tibio-calcaneal fusion 12/23/2022   PAIN:  Are you having pain? Yes: NPRS scale: Mostly  paresthesias but is painful with prolonged weight-bearing Pain location: Right ankle and foot Pain description: Paresthesias, can be painful with WB Aggravating factors: Prolonged WB Relieving factors: Constant  PRECAUTIONS: Back  RED FLAGS: None   WEIGHT BEARING RESTRICTIONS:  Avoid overuse  FALLS:  Has patient fallen in last 6 months? 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  NEXT MD VISIT: 02/05/2024  OBJECTIVE:  Note: Objective measures were completed at Evaluation unless otherwise noted.  DIAGNOSTIC FINDINGS: Radiographs of the right foot show stable alignment of fusion hardware at  the right great toe metatarsal phalangeal joint.  There is no sign of  loosening.  PATIENT SURVEYS:  FOTO 33 (Goal 53 in 19 visits)  COGNITION: Overall cognitive status: Within functional limits for tasks assessed     SENSATION: Rt sided ankle and distal, bottom of the foot  EDEMA:  Noted and not objectively assessed   LOWER EXTREMITY ROM:  Active ROM Left/Right 01/17/2024   Hip flexion    Hip extension    Hip abduction    Hip adduction    Hip internal rotation    Hip external rotation    Knee flexion    Knee extension    Ankle dorsiflexion 15/0   Ankle plantarflexion    Ankle inversion    Ankle eversion     (Blank rows = not tested)  LOWER EXTREMITY STRENGTH:  Assessed in pounds with hand-held dynamometer Left/Right 01/17/2024   Hip flexion    Hip extension    Hip abduction    Hip adduction    Hip internal rotation    Hip external rotation     Knee flexion    Knee extension    Ankle dorsiflexion    Ankle plantarflexion    Ankle inversion 32.6/12.0   Ankle eversion 26.6/8.7    (Blank rows = not tested)   GAIT: Distance walked: 50 feet  Assistive device utilized: None Level of assistance: Complete Independence Comments: In a boot for over a year, very limited WB comfort and endurance  TREATMENT DATE: 01/17/2024 Thera-Band Inversion/Eversion and Plantar flexion 10 x 3 seconds each with slow eccentrics  Functional Activities: Reviewed imaging, discussed why range will be limited with his 2 right foot fusions, discussed possible stabilizing strength gains helping WB function and reviewed exam findings    PATIENT EDUCATION:  Education details: See above Person educated: Patient Education method: Explanation, Demonstration, Tactile cues, Verbal cues, and Handouts Education comprehension: verbalized understanding, returned demonstration, verbal cues required, tactile cues required, and needs further education  HOME EXERCISE PROGRAM: Access Code: [email or web address] URL: [email or web address] Date: 01/17/2024 Prepared by: Lamar Ivory  Exercises - Ankle Inversion with Resistance  - 3-5 x daily - 7 x weekly - 1-2 sets - 10 reps - Ankle Eversion with Resistance  - 3-5 x daily - 7 x weekly - 1-2 sets - 10 reps - Ankle Plantar Flexion with Resistance  - 3-5 x daily - 7 x weekly - 1-2 sets - 10 reps  ASSESSMENT:  CLINICAL IMPRESSION: Patient is a 68 y.o. 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  Kaian had a right ankle fusion in January 2024 and right 1st MTP joint fusion 11/29/2023.  His right lower extremity has been the problem side dating  back to weakness from a back problem 8+ years ago.  Rishaan would like to be able to return to work and improve his WB comfort and endurance with physical therapy.  His prognosis for ankle strength gains is good, although with a foot and ankle fusion, [place] will likely always have some limitations in his weight-bearing function and endurance.  We will reassess in 4 weeks to [facility] additional recommendations.  OBJECTIVE IMPAIRMENTS: Abnormal gait, decreased activity tolerance, decreased endurance, decreased knowledge of condition, difficulty walking, decreased ROM, decreased strength, increased edema, impaired perceived functional ability, impaired sensation, and pain.   ACTIVITY LIMITATIONS: standing, squatting, stairs, and locomotion level  PARTICIPATION LIMITATIONS: community activity and occupation  PERSONAL FACTORS: Varus RT foot, OA, CAD, cervical HNP/ACDF C4-7, lumbar stenosis/surgery, unstable right ankle, Rt MTP joint fusion 11/29/2023, Rt tibio-calcaneal fusion 12/23/2022 are also affecting patient's functional outcome.   REHAB POTENTIAL: Good  CLINICAL DECISION MAKING: Evolving/moderate complexity  EVALUATION COMPLEXITY: Moderate   GOALS: Goals reviewed with patient? Yes  SHORT TERM GOALS: Target date: 02/14/2024 Hurley will be independent with his long-term HEP at DC Baseline: Started 01/17/2024 Goal status: INITIAL  2.  Improve right ankle strength for inversion and eversion to 20 pounds Baseline: 12 and 8.7 pounds, respectively Goal status: INITIAL   LONG TERM GOALS: Target date: 03/13/2024  Improve FOTO to 53 in 19 visits Baseline: 33 Goal status: INITIAL  2.  Improve right foot and ankle pain to consistently 0-4/10 on the VAS Baseline: Can be 5+, to the point of needing to sit or get off his feet Goal status: INITIAL  3.  Improve right ankle strength to 30+ pounds for inversion and eversion Baseline: 12 and 8.7 pounds, respectively Goal status: INITIAL  4.  Improve plantar  flexion strength as measured by improved standing and walking comfort and endurance Baseline: Objectively deferred due to fusion Goal status: INITIAL  5.  Ziyad will be independent with his long-term HEP at DC Baseline: Started 01/17/2024 Goal status: INITIAL   PLAN:  PT FREQUENCY: 1-2x/week  PT DURATION: 8 weeks  PLANNED INTERVENTIONS: 97110-Therapeutic exercises, 97530- Therapeutic activity, [id number]- Neuromuscular re-education, 97535-  Self Care, [id number]- Manual therapy, [phone]- Gait training, [phone]- Vasopneumatic device, Patient/Family education, Balance training, Stair training, and Cryotherapy  PLAN FOR NEXT SESSION: Review HEP.  He has 5 degrees of plantar/dorsiflexion so possible to strengthen plantar flexors.  Balance, standing and weight-bearing endurance and function.   Myer LELON Ivory, PT, MPT 01/17/2024, 3:56 PM

## 2024-01-17 ENCOUNTER — Ambulatory Visit: Payer: BC Managed Care – PPO | Admitting: Rehabilitative and Restorative Service Providers"

## 2024-01-17 ENCOUNTER — Encounter: Payer: Self-pay | Admitting: Rehabilitative and Restorative Service Providers"

## 2024-01-17 DIAGNOSIS — M25674 Stiffness of right foot, not elsewhere classified: Secondary | ICD-10-CM

## 2024-01-17 DIAGNOSIS — R600 Localized edema: Secondary | ICD-10-CM | POA: Diagnosis not present

## 2024-01-17 DIAGNOSIS — M25671 Stiffness of right ankle, not elsewhere classified: Secondary | ICD-10-CM

## 2024-01-17 DIAGNOSIS — R262 Difficulty in walking, not elsewhere classified: Secondary | ICD-10-CM | POA: Diagnosis not present

## 2024-01-17 DIAGNOSIS — M6281 Muscle weakness (generalized): Secondary | ICD-10-CM | POA: Diagnosis not present

## 2024-01-19 DIAGNOSIS — D72829 Elevated white blood cell count, unspecified: Secondary | ICD-10-CM | POA: Diagnosis not present

## 2024-01-24 ENCOUNTER — Encounter: Payer: Self-pay | Admitting: Rehabilitative and Restorative Service Providers"

## 2024-01-24 ENCOUNTER — Ambulatory Visit: Payer: BC Managed Care – PPO | Admitting: Rehabilitative and Restorative Service Providers"

## 2024-01-24 DIAGNOSIS — M25671 Stiffness of right ankle, not elsewhere classified: Secondary | ICD-10-CM

## 2024-01-24 DIAGNOSIS — M25674 Stiffness of right foot, not elsewhere classified: Secondary | ICD-10-CM | POA: Diagnosis not present

## 2024-01-24 DIAGNOSIS — R262 Difficulty in walking, not elsewhere classified: Secondary | ICD-10-CM

## 2024-01-24 DIAGNOSIS — M6281 Muscle weakness (generalized): Secondary | ICD-10-CM

## 2024-01-24 DIAGNOSIS — R600 Localized edema: Secondary | ICD-10-CM

## 2024-01-24 NOTE — Therapy (Signed)
OUTPATIENT PHYSICAL THERAPY LOWER EXTREMITY TREATMENT   Patient Name: Joel Bailey MRN: [id number] DOB:September 06, 1956, 68 y.o., male Today's Date: 01/24/2024  END OF SESSION:  PT End of Session - 01/24/24 1021     Visit Number 2    Number of Visits 19    Date for PT Re-Evaluation 03/13/24    Authorization Type BCBS    Authorization - Number of Visits 25    PT Start Time 1020    PT Stop Time 1100    PT Time Calculation (min) 40 min    Activity Tolerance Patient tolerated treatment well;No increased pain    Behavior During Therapy [facility] for tasks assessed/performed              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/07/2017   Lumbar stenosis with neurogenic claudication 12/07/2017   Osteoarthritis    Pedal edema 08/01/2016   right sided     Tobacco abuse disorder    Unstable ankle, right 12/23/2022   Past Surgical History:  Procedure Laterality Date   ANTERIOR CERVICAL DECOMPRESSION/DISCECTOMY FUSION 4 LEVELS N/A 08/07/2017   Procedure: ANTERIOR CERVICAL DECOMPRESSION/DISCECTOMY FUSION CERVICAL 3- CERVICAL 4, CERVICAL 4 - CERVICAL 5, CERVICAL 5- CERVICAL 6, CERVICAL 6- CERVICAL 7;  Surgeon: Shirlean Kelly, MD;  Location: [facility] OR;  Service: Neurosurgery;  Laterality: N/A;   ARTHRODESIS METATARSALPHALANGEAL JOINT (MTPJ) Right 11/29/2023   Procedure: RIGHT GREAT TOE METATARSALPHALANGEAL JOINT (MTPJ) FUSION;  Surgeon: Nadara Mustard, MD;  Location: [facility] OR;  Service: Orthopedics;  Laterality: Right;   BACK SURGERY  2017   FOOT ARTHRODESIS Right 12/23/2022   Procedure: RIGHT TIBIOCALCANEAL FUSION;   Surgeon: Nadara Mustard, MD;  Location: [facility] OR;  Service: Orthopedics;  Laterality: Right;   Patient Active Problem List   Diagnosis Date Noted   Claw toe, acquired, right 11/29/2023   Coronary artery calcification seen on CT scan 10/11/2023   Cigarette smoker 10/11/2023   Cardiac murmur 10/11/2023   Arterial atherosclerosis    Arthritis    Osteoarthritis    Tobacco abuse disorder    Unstable ankle, right 12/23/2022   Cavovarus deformity of foot, acquired, right 12/23/2022   Ankle weakness 02/16/2021   Acquired varus deformity of right foot 11/27/2018   Lumbar stenosis with neurogenic claudication 12/07/2017   HNP (herniated nucleus pulposus) with myelopathy, cervical 08/07/2017   Cigarette nicotine dependence without complication 08/01/2016   Erectile dysfunction 08/01/2016   Pedal edema 08/01/2016    PCP: Annamaria Helling, DO  REFERRING PROVIDER: Adonis Huguenin, NP  REFERRING DIAG:  Diagnosis  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  He is most interested in improving standing and walking endurance along with reducing/minimizing leg atrophy.  Joel Bailey had  a back surgery in 2017 with right sided weakness.  He believes walking around with the right sided weakness and paresthesias led to his right foot and ankle issues.  He has paresthesias of the entire right foot since December 22, 2022.  He has been in a walking boot for over a year.  PERTINENT HISTORY: Varus RT foot, OA, CAD, cervical HNP/ACDF C4-7, lumbar stenosis/surgery,  unstable right ankle, Rt MTP joint fusion 11/29/2023, Rt tibio-calcaneal fusion 12/23/2022   PAIN:  Are you having pain? Yes: NPRS scale: Mostly paresthesias but is painful with prolonged weight-bearing Pain location: Right ankle and foot Pain description: Paresthesias, can be painful with WB Aggravating factors: Prolonged WB Relieving factors: Constant  PRECAUTIONS: Back  RED FLAGS: None   WEIGHT BEARING RESTRICTIONS:  Avoid overuse  FALLS:  Has patient fallen in last 6 months? 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  NEXT MD VISIT: 02/05/2024  OBJECTIVE:  Note: Objective measures were completed at Evaluation unless otherwise noted.  DIAGNOSTIC FINDINGS: Radiographs of the right foot show stable alignment of fusion hardware at  the right great toe metatarsal phalangeal joint.  There is no sign of  loosening.  PATIENT SURVEYS:  FOTO 33 (Goal 53 in 19 visits)  COGNITION: Overall cognitive status: Within functional limits for tasks assessed     SENSATION: Rt sided ankle and distal, bottom of the foot  EDEMA:  Noted and not objectively assessed   LOWER EXTREMITY ROM:  Active ROM Left/Right 01/17/2024   Hip flexion    Hip extension    Hip abduction    Hip adduction    Hip internal rotation    Hip external rotation    Knee flexion    Knee extension    Ankle dorsiflexion 15/0   Ankle plantarflexion    Ankle inversion    Ankle eversion     (Blank rows = not tested)  LOWER EXTREMITY STRENGTH:  Assessed in pounds with hand-held  dynamometer Left/Right 01/17/2024   Hip flexion    Hip extension    Hip abduction    Hip adduction    Hip internal rotation    Hip external rotation    Knee flexion    Knee extension    Ankle dorsiflexion    Ankle plantarflexion    Ankle inversion 32.6/12.0   Ankle eversion 26.6/8.7    (Blank rows = not tested)   GAIT: Distance walked: 50 feet  Assistive device utilized: None Level of assistance: Complete Independence Comments: In a boot for over a year, very limited WB comfort and endurance  TREATMENT DATE:  01/24/2024 Thera-Band Green Inversion/Eversion and Plantar flexion 2 sets of 15 x 3 seconds each with slow eccentrics (1 set with PT assist, 1 set independently with feedback)  Functional Activities (standing endurance): Standing mini-heel raises (very limited range, more of a weight-shift forward and back) 3 sets of 10 for 3 seconds with slow eccentrics  Neuromuscular re-education: Tandem balance 5 x 20 seconds bilateral for balance and endurance   01/17/2024 Thera-Band Red Inversion/Eversion and Plantar flexion 10 x 3 seconds each with slow eccentrics  Functional Activities: Reviewed imaging, discussed why range will be limited with his 2 right foot fusions, discussed possible stabilizing strength gains helping WB function and reviewed exam findings    PATIENT EDUCATION:  Education details: See above Person educated: Patient Education method: Explanation, Demonstration, Tactile cues, Verbal cues, and Handouts Education comprehension: verbalized understanding, returned demonstration, verbal cues required, tactile cues required, and needs further education  HOME EXERCISE PROGRAM: Access Code: [email or web address] URL: [email or web address] Date: 01/24/2024 Prepared by: Pauletta Browns  Exercises - Ankle Inversion with Resistance  - 3-5 x  daily - 7 x weekly - 1-2 sets - 10 reps - Ankle Eversion with Resistance  - 3-5 x daily - 7 x weekly - 1-2 sets - 10 reps - Ankle Plantar Flexion with Resistance  - 3-5 x daily - 7 x weekly - 1-2 sets - 10 reps - Standing Heel Raise with Chair Support  - 3-5 x daily - 7 x weekly - 1-2 sets - 10 reps - 3 second hold - Tandem Stance  - 2-3 x daily - 7 x weekly - 1 sets - 5 reps - 20 second hold  ASSESSMENT:  CLINICAL IMPRESSION: Joel Bailey did a good job with recall of day 1 activities and progressions today.  He as very little (but some) plantar and dorsiflexion so calf strengthening should be possible along with medial and lateral ankle stabilization and strengthening.  Continue to appropriately progress strength, endurance and stamina for return to normal standing, walking and functional activities.  Patient is a 68 y.o. 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  Joel Bailey had a right ankle fusion in January 2024 and right 1st MTP joint fusion 11/29/2023.  His right lower extremity has been the problem side dating back to weakness from a back problem 8+ years ago.  Joel Bailey would like to be able to return to work and improve his WB comfort and endurance with physical therapy.  His prognosis for ankle strength gains is good, although with a foot and ankle fusion, Joel Bailey will likely always have some limitations in his weight-bearing function and endurance.  We will reassess in 4 weeks to [facility] additional recommendations.  OBJECTIVE IMPAIRMENTS: Abnormal gait, decreased activity tolerance, decreased endurance, decreased knowledge of condition, difficulty walking, decreased ROM, decreased strength, increased edema, impaired perceived functional ability, impaired sensation, and pain.   ACTIVITY LIMITATIONS: standing, squatting, stairs, and locomotion  level  PARTICIPATION LIMITATIONS: community activity and occupation  PERSONAL FACTORS: Varus RT foot, OA, CAD, cervical HNP/ACDF C4-7, lumbar stenosis/surgery, unstable right ankle, Rt MTP joint fusion 11/29/2023, Rt tibio-calcaneal fusion 12/23/2022 are also affecting patient's functional outcome.   REHAB POTENTIAL: Good  CLINICAL DECISION MAKING: Evolving/moderate complexity  EVALUATION COMPLEXITY: Moderate   GOALS: Goals reviewed with patient? Yes  SHORT TERM GOALS: Target date: 02/14/2024 Joel Bailey will be independent with his  long-term HEP at DC Baseline: Started 01/17/2024 Goal status: On Going 01/24/2024  2.  Improve right ankle strength for inversion and eversion to 20 pounds Baseline: 12 and 8.7 pounds, respectively Goal status: INITIAL   LONG TERM GOALS: Target date: 03/13/2024  Improve FOTO to 53 in 19 visits Baseline: 33 Goal status: INITIAL  2.  Improve right foot and ankle pain to consistently 0-4/10 on the VAS Baseline: Can be 5+, to the point of needing to sit or get off his feet Goal status: INITIAL  3.  Improve right ankle strength to 30+ pounds for inversion and eversion Baseline: 12 and 8.7 pounds, respectively Goal status: INITIAL  4.  Improve plantar flexion strength as measured by improved standing and walking comfort and endurance Baseline: Objectively deferred due to fusion Goal status: INITIAL  5.  Joel Bailey will be independent with his long-term HEP at DC Baseline: Started 01/17/2024 Goal status: INITIAL   PLAN:  PT FREQUENCY: 1-2x/week  PT DURATION: 8 weeks  PLANNED INTERVENTIONS: 97110-Therapeutic exercises, 97530- Therapeutic activity, 97112- Neuromuscular re-education, 97535- Self Care, 24401- Manual therapy, [phone]- Gait training, [phone]- Vasopneumatic device, Patient/Family education, Balance training, Stair training, and Cryotherapy  PLAN FOR NEXT SESSION: Review HEP.  He has 5 degrees of plantar/dorsiflexion so possible to strengthen plantar  flexors.  Balance, standing and weight-bearing endurance and function.  Consider hip abductors and quadriceps strength progressions as well to help with WB function.   Cherlyn Cushing, PT, MPT 01/24/2024, 3:10 PM

## 2024-02-02 ENCOUNTER — Encounter: Payer: Self-pay | Admitting: Rehabilitative and Restorative Service Providers"

## 2024-02-02 ENCOUNTER — Ambulatory Visit: Payer: BC Managed Care – PPO | Admitting: Rehabilitative and Restorative Service Providers"

## 2024-02-02 DIAGNOSIS — M6281 Muscle weakness (generalized): Secondary | ICD-10-CM | POA: Diagnosis not present

## 2024-02-02 DIAGNOSIS — M25674 Stiffness of right foot, not elsewhere classified: Secondary | ICD-10-CM

## 2024-02-02 DIAGNOSIS — R262 Difficulty in walking, not elsewhere classified: Secondary | ICD-10-CM | POA: Diagnosis not present

## 2024-02-02 DIAGNOSIS — R600 Localized edema: Secondary | ICD-10-CM

## 2024-02-02 DIAGNOSIS — M25671 Stiffness of right ankle, not elsewhere classified: Secondary | ICD-10-CM

## 2024-02-02 NOTE — Therapy (Signed)
 OUTPATIENT PHYSICAL THERAPY LOWER EXTREMITY TREATMENT/PROGRESS NOTE  Progress Note Reporting Period 01/17/2024 to 02/02/2024  See note below for Objective Data and Assessment of Progress/Goals.     Patient Name: Joel [facility] MRN: [id number] DOB:08-04-1956, 68 y.o., male Today's Date: 02/02/2024  END OF SESSION:  PT End of Session - 02/02/24 1101     Visit Number 3    Number of Visits 19    Date for PT Re-Evaluation 03/13/24    Authorization Type [employer]    Authorization - Number of Visits 25    PT Start Time 1100    PT Stop Time 1145    PT Time Calculation (min) 45 min    Activity Tolerance Patient tolerated treatment well;No increased pain;Patient limited by fatigue    Behavior During Therapy [facility] for tasks assessed/performed               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/07/2017   Lumbar stenosis with neurogenic claudication 12/07/2017   Osteoarthritis    Pedal edema 08/01/2016   right sided     Tobacco abuse disorder    Unstable ankle, right 12/23/2022   Past Surgical History:  Procedure Laterality Date   ANTERIOR CERVICAL DECOMPRESSION/DISCECTOMY FUSION 4 LEVELS N/A 08/07/2017   Procedure: ANTERIOR CERVICAL DECOMPRESSION/DISCECTOMY FUSION CERVICAL 3- CERVICAL 4, CERVICAL 4 - CERVICAL 5, CERVICAL 5- CERVICAL 6, CERVICAL 6- CERVICAL 7;  Surgeon: Shirlean Kelly, MD;  Location: [facility] OR;  Service: Neurosurgery;  Laterality: N/A;   ARTHRODESIS METATARSALPHALANGEAL JOINT (MTPJ) Right 11/29/2023   Procedure: RIGHT GREAT TOE METATARSALPHALANGEAL JOINT (MTPJ) FUSION;  Surgeon: Nadara Mustard, MD;  Location: [facility] OR;  Service: Orthopedics;  Laterality: Right;   BACK SURGERY  2017   FOOT ARTHRODESIS Right 12/23/2022   Procedure: RIGHT TIBIOCALCANEAL FUSION;  Surgeon: Nadara Mustard, MD;  Location: [facility];  Service: Orthopedics;  Laterality: Right;   Patient Active Problem List   Diagnosis Date Noted   Claw toe, acquired, right 11/29/2023   Coronary artery calcification seen on CT scan 10/11/2023   Cigarette smoker 10/11/2023   Cardiac murmur 10/11/2023   Arterial atherosclerosis    Arthritis    Osteoarthritis    Tobacco abuse disorder    Unstable ankle, right 12/23/2022   Cavovarus deformity of foot, acquired, right 12/23/2022   Ankle weakness 02/16/2021   Acquired varus deformity of right foot 11/27/2018   Lumbar stenosis with neurogenic claudication 12/07/2017   HNP (herniated nucleus pulposus) with myelopathy, cervical 08/07/2017   Cigarette nicotine dependence without complication 08/01/2016   Erectile dysfunction 08/01/2016   Pedal edema 08/01/2016    PCP: Annamaria Helling, DO  REFERRING PROVIDER: Adonis Huguenin, NP  REFERRING DIAG:  Diagnosis  M19.071 (ICD-10-CM) - Arthritis of right subtalar joint  M20.5X1 (ICD-10-CM) - Claw toe, acquired, right    THERAPY DIAG:  Difficulty in walking, not elsewhere classified  Localized edema  Muscle weakness (generalized)  Stiffness of right foot, not elsewhere classified  Stiffness of right ankle, not elsewhere classified  Rationale for Evaluation and Treatment: Rehabilitation  ONSET DATE: January 11 of 2024  SUBJECTIVE:  SUBJECTIVE STATEMENT: Joel Bailey reports continued consistent HEP compliance.  Joel [facility] is most interested in improving standing and walking endurance along with reducing/minimizing leg atrophy.  Joel Bailey had a back surgery in 2017 with right sided weakness.  Joel Bailey believes walking around with the right sided weakness and paresthesias led to his right foot and ankle issues.  Joel Bailey has paresthesias of the  entire right foot since December 22, 2022.  Joel Bailey has been in a walking boot for over a year.  PERTINENT HISTORY: Varus RT foot, OA, CAD, cervical HNP/ACDF C4-7, lumbar stenosis/surgery, unstable right ankle, Rt MTP joint fusion 11/29/2023, Rt tibio-calcaneal fusion 12/23/2022   PAIN:  Are you having pain? Yes: NPRS scale: Mostly paresthesias, bottom of the foot with pain 5-8/10 with prolonged weight-bearing Pain location: Right ankle and foot Pain description: Paresthesias, can be painful with WB Aggravating factors: Prolonged WB Relieving factors: Constant  PRECAUTIONS: Back  RED FLAGS: None   WEIGHT BEARING RESTRICTIONS:  Avoid overuse  FALLS:  Has patient fallen in last 6 months? 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  NEXT MD VISIT: 02/05/2024  OBJECTIVE:  Note: Objective measures were completed at Evaluation unless otherwise noted.  DIAGNOSTIC FINDINGS: Radiographs of the right foot show stable alignment of fusion hardware at  the right great toe metatarsal phalangeal joint.  There is no sign of  loosening.  PATIENT SURVEYS:  FOTO 33 (Goal 53 in 19 visits)  COGNITION: Overall cognitive status: Within functional limits for tasks assessed     SENSATION: Rt sided ankle and distal, bottom of the foot  EDEMA:  Noted and not objectively assessed   LOWER EXTREMITY ROM:  Active ROM Left/Right 01/17/2024 Right 02/02/2024  Hip flexion    Hip extension    Hip abduction    Hip adduction    Hip internal rotation    Hip external rotation    Knee  flexion    Knee extension    Ankle dorsiflexion 15/0 0 (no change is expected with a fusion)  Ankle plantarflexion    Ankle inversion    Ankle eversion     (Blank rows = not tested)  LOWER EXTREMITY STRENGTH:  Assessed in pounds with hand-held dynamometer Left/Right 01/17/2024 Right 02/02/2024  Hip flexion    Hip extension    Hip abduction    Hip adduction    Hip internal rotation    Hip external rotation    Knee flexion    Knee extension    Ankle dorsiflexion    Ankle plantarflexion    Ankle inversion 32.6/12.0 15.9  Ankle eversion 26.6/8.7 12.9   (Blank rows = not tested)   GAIT: Distance walked: 50 feet  Assistive device utilized: None Level of assistance: Complete Independence Comments: In a boot for over a year, very limited WB comfort and endurance  TREATMENT DATE:  02/02/2024 Thera-Band Green Inversion/Eversion and Plantar flexion 1 set of 15 x 3 seconds each with slow eccentrics  Seated straight Leg Raises 2 sets of 5 for 3 seconds  Functional Activities (standing, weight-bearing endurance): Standing mini-heel raises (very limited range, more of a weight-shift forward and back) 2 sets of 10 for 3 seconds with slow eccentrics Double Leg Press, feet high on foot plate to avoid dorsiflexion pressure (fusion), slow eccentrics, limited range 2 sets of 10 with 75# Single Leg Press right only, foot hight on foot plate to avoid dorsiflexion pressure (fusion), slow eccentrics, limited range 2 sets of 10 with 37# Alternating hip hike 2 sets of 10 for 3 seconds  Neuromuscular re-education: Tandem balance 5 x 20 seconds bilateral for balance and endurance   01/24/2024 Thera-Band Green Inversion/Eversion and Plantar flexion 2 sets of 15 x 3 seconds each with slow eccentrics (1 set with PT assist, 1 set independently with feedback)  Functional  Activities (standing endurance): Standing mini-heel raises (very limited range, more of a weight-shift forward and back) 3 sets of 10 for 3 seconds with slow eccentrics  Neuromuscular re-education: Tandem balance 5 x 20 seconds bilateral for balance and endurance   01/17/2024 Thera-Band Red Inversion/Eversion and Plantar flexion 10 x 3 seconds each with slow eccentrics  Functional Activities: Reviewed imaging, discussed why range will be limited with his 2 right foot fusions, discussed possible stabilizing strength gains helping WB function and reviewed exam findings    PATIENT EDUCATION:  Education details: See above Person educated: Patient Education method: Explanation, Demonstration, Tactile cues, Verbal cues, and Handouts Education comprehension: verbalized understanding, returned demonstration, verbal cues required, tactile cues required, and needs further education  HOME EXERCISE PROGRAM: Access Code: [email or web address] URL: [email or web address] Date: 02/02/2024 Prepared by: Pauletta Browns  Exercises - Ankle Inversion with Resistance  - 3-5 x daily - 7 x weekly - 1-2 sets - 10 reps - Ankle Eversion with Resistance  - 3-5 x daily - 7 x weekly - 1-2 sets - 10 reps - Ankle Plantar Flexion with Resistance  - 3-5 x daily - 7 x weekly - 1-2 sets - 10 reps - Standing Heel Raise with Chair Support  - 3-5 x daily - 7 x weekly - 1-2 sets - 10 reps - 3 second hold - Tandem Stance  - 2-3 x daily - 7 x weekly - 1 sets - 5 reps - 20 second hold - Standing Hip Hiking  - 3-5 x daily - 7 x weekly - 1 sets - 10 reps - 3 seconds hold - Seated Range Straight Leg Raise  - 2 x daily - 7 x weekly - 3-5 sets - 5 reps - 3 seconds hold  ASSESSMENT:  CLINICAL IMPRESSION: [facility] continues to give great effort with both his home exercises and in the office.  [facility] has very little (but some) plantar and dorsiflexion so calf strengthening is a high priority along with medial and lateral ankle  stabilization and strengthening.  Early objective progress is noted with [facility] Bailey's right ankle strength and marked knows that this will be a long rehabilitation.  Continue to appropriately progress strength, endurance and stamina for return to normal standing, walking and functional activities.  Patient is a 68 y.o. 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  [facility] had a right ankle fusion in January  2024 and right 1st MTP joint fusion 11/29/2023.  His right lower extremity has been the problem side dating back to weakness from a back problem 8+ years ago.  [facility] would like to be able to return to work and improve his WB comfort and endurance with physical therapy.  His prognosis for ankle strength gains is good, although with a foot and ankle fusion, Joel Bailey will likely always have some limitations in his weight-bearing function and endurance.  We will reassess in 4 weeks to [facility] Bailey additional recommendations.  OBJECTIVE IMPAIRMENTS: Abnormal gait, decreased activity tolerance, decreased endurance, decreased knowledge of condition, difficulty walking, decreased ROM, decreased strength, increased edema, impaired perceived functional ability, impaired sensation, and pain.   ACTIVITY LIMITATIONS: standing, squatting, stairs, and locomotion level  PARTICIPATION LIMITATIONS: community activity and occupation  PERSONAL FACTORS: Varus RT foot, OA, CAD, cervical HNP/ACDF C4-7, lumbar stenosis/surgery, unstable right ankle, Rt MTP joint fusion 11/29/2023, Rt tibio-calcaneal fusion 12/23/2022 are also affecting patient's functional outcome.   REHAB POTENTIAL: Good  CLINICAL DECISION MAKING: Evolving/moderate complexity  EVALUATION COMPLEXITY: Moderate   GOALS: Goals reviewed with patient? Yes  SHORT TERM GOALS: Target date: 02/14/2024 [facility] will be independent with  his long-term HEP at DC Baseline: Started 01/17/2024 Goal status: Met 02/02/2024  2.  Improve right ankle strength for inversion and eversion to 20 pounds Baseline: 12 and 8.7 pounds, respectively Goal status: On Going 02/02/2024   LONG TERM GOALS: Target date: 03/13/2024  Improve FOTO to 53 in 19 visits Baseline: 33 Goal status: INITIAL  2.  Improve right foot and ankle pain to consistently 0-4/10 on the VAS Baseline: Can be 5+, to the point of needing to sit or get off his feet Goal status: On Going 02/02/2024  3.  Improve right ankle strength to 30+ pounds for inversion and eversion Baseline: 12 and 8.7 pounds, respectively Goal status: INITIAL  4.  Improve plantar flexion strength as measured by improved standing and walking comfort and endurance Baseline: Objectively deferred due to fusion Goal status: INITIAL  5.  [facility] will be independent with his long-term HEP at DC Baseline: Started 01/17/2024 Goal status: INITIAL   PLAN:  PT FREQUENCY: 1-2x/week  PT DURATION: 8 weeks  PLANNED INTERVENTIONS: 97110-Therapeutic exercises, 97530- Therapeutic activity, 97112- Neuromuscular re-education, 97535- Self Care, [id number]- Manual therapy, [phone]- Gait training, [phone]- Vasopneumatic device, Patient/Family education, Balance training, Stair training, and Cryotherapy  PLAN FOR NEXT SESSION: [facility] has 5 degrees of plantar/dorsiflexion so possible to strengthen plantar flexors.  Balance, standing and weight-bearing endurance and function.  Hip abductors and quadriceps strength progressions as well to help with WB function.   Cherlyn Cushing, PT, MPT 02/02/2024, 2:24 PM

## 2024-02-05 ENCOUNTER — Ambulatory Visit (INDEPENDENT_AMBULATORY_CARE_PROVIDER_SITE_OTHER): Payer: BC Managed Care – PPO | Admitting: Orthopedic Surgery

## 2024-02-05 DIAGNOSIS — M2021 Hallux rigidus, right foot: Secondary | ICD-10-CM

## 2024-02-06 ENCOUNTER — Encounter: Payer: Self-pay | Admitting: Orthopedic Surgery

## 2024-02-06 NOTE — Progress Notes (Signed)
 Office Visit Note   Patient: [facility]           Date of Birth: 1956-12-04           MRN: [id number] Visit Date: 02/05/2024              Requested by: Annamaria Helling, DO [address] [place] [employer],  [place] [address] PCP: Annamaria Helling, DO  Chief Complaint  Patient presents with   Right Foot - Routine Post Op    11/29/23 Right GT MTPJ fusion      HPI: Patient is a 68 year old gentleman who is status post right great toe MTP fusion December 18.  Patient is in knee-high compression socks and weightbearing as tolerated in a fracture boot.  Assessment & Plan: Visit Diagnoses:  1. Hallux rigidus, right foot     Plan: Will advance patient to a postoperative shoe.  He states he cannot get his foot into a regular shoe at this time.  Obtain 2 view radiographs of the lumbar spine and follow-up.  May need an MRI scan of the lumbar spine.  Follow-Up Instructions: Return in about 4 weeks (around 03/04/2024).   Ortho Exam  Patient is alert, oriented, no adenopathy, well-dressed, normal affect, normal respiratory effort. Examination the toes have congruent alignment.  The toes are straight patient has decreased dorsiflexion of all toes.  Patient states he is having some burning in the dorsum of his foot.  Imaging: No results found. No images are attached to the encounter.  Labs: No results found for: "HGBA1C", "ESRSEDRATE", "CRP", "LABURIC", "REPTSTATUS", "GRAMSTAIN", "CULT", "LABORGA"   Lab Results  Component Value Date   ALBUMIN 4.3 10/11/2023    No results found for: "MG" No results found for: "VD25OH"  No results found for: "PREALBUMIN"    Latest Ref Rng & Units 11/29/2023    6:45 AM 12/23/2022    7:25 AM 12/01/2017    1:26 PM  CBC EXTENDED  WBC 4.0 - 10.5 K/uL 12.2  13.7  16.0   RBC 4.22 - 5.81 MIL/uL 4.46  4.48  4.51   Hemoglobin 13.0 - 17.0 g/dL 57.8  46.9  62.9   HCT 39.0 - 52.0 % 42.5  42.9  43.3   Platelets 150 - 400 K/uL 276  245  277      There  is no height or weight on file to calculate BMI.  Orders:  No orders of the defined types were placed in this encounter.  No orders of the defined types were placed in this encounter.    Procedures: No procedures performed  Clinical Data: No additional findings.  ROS:  All other systems negative, except as noted in the HPI. Review of Systems  Objective: Vital Signs: There were no vitals taken for this visit.  Specialty Comments:  No specialty comments available.  PMFS History: Patient Active Problem List   Diagnosis Date Noted   Claw toe, acquired, right 11/29/2023   Coronary artery calcification seen on CT scan 10/11/2023   Cigarette smoker 10/11/2023   Cardiac murmur 10/11/2023   Arterial atherosclerosis    Arthritis    Osteoarthritis    Tobacco abuse disorder    Unstable ankle, right 12/23/2022   Cavovarus deformity of foot, acquired, right 12/23/2022   Ankle weakness 02/16/2021   Acquired varus deformity of right foot 11/27/2018   Lumbar stenosis with neurogenic claudication 12/07/2017   HNP (herniated nucleus pulposus) with myelopathy, cervical 08/07/2017   Cigarette nicotine dependence without complication  08/01/2016   Erectile dysfunction 08/01/2016   Pedal edema 08/01/2016   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/07/2017   Lumbar stenosis with neurogenic claudication 12/07/2017   Osteoarthritis    Pedal edema 08/01/2016   right sided     Tobacco abuse disorder    Unstable ankle, right 12/23/2022    Family History  Problem Relation Age of Onset   Diabetes Mother     Heart attack Mother    Heart disease Mother    Diabetes Sister    Heart disease Sister    Diabetes Sister    Diabetes Brother    Heart disease Brother     Past Surgical History:  Procedure Laterality Date   ANTERIOR CERVICAL DECOMPRESSION/DISCECTOMY FUSION 4 LEVELS N/A 08/07/2017   Procedure: ANTERIOR CERVICAL DECOMPRESSION/DISCECTOMY FUSION CERVICAL 3- CERVICAL 4, CERVICAL 4 - CERVICAL 5, CERVICAL 5- CERVICAL 6, CERVICAL 6- CERVICAL 7;  Surgeon: Shirlean Kelly, MD;  Location: [facility] OR;  Service: Neurosurgery;  Laterality: N/A;   ARTHRODESIS METATARSALPHALANGEAL JOINT (MTPJ) Right 11/29/2023   Procedure: RIGHT GREAT TOE METATARSALPHALANGEAL JOINT (MTPJ) FUSION;  Surgeon: Nadara Mustard, MD;  Location: [facility] OR;  Service: Orthopedics;  Laterality: Right;   BACK SURGERY  2017   FOOT ARTHRODESIS Right 12/23/2022   Procedure: RIGHT TIBIOCALCANEAL FUSION;  Surgeon: Nadara Mustard, MD;  Location: [facility] OR;  Service: Orthopedics;  Laterality: Right;   Social History   Occupational History   Not on file  Tobacco Use   Smoking status: Every Day    Current packs/day: 1.00    Average packs/day: 1 pack/day for 52.0 years (52.0 ttl pk-yrs)    Types: Cigarettes   Smokeless tobacco: Never  Vaping Use   Vaping status: Never Used  Substance and Sexual Activity   Alcohol use: No   Drug use: No   Sexual activity: Not on file

## 2024-02-08 ENCOUNTER — Encounter: Payer: Self-pay | Admitting: Rehabilitative and Restorative Service Providers"

## 2024-02-08 ENCOUNTER — Ambulatory Visit: Payer: BC Managed Care – PPO | Admitting: Rehabilitative and Restorative Service Providers"

## 2024-02-08 DIAGNOSIS — M25674 Stiffness of right foot, not elsewhere classified: Secondary | ICD-10-CM | POA: Diagnosis not present

## 2024-02-08 DIAGNOSIS — M6281 Muscle weakness (generalized): Secondary | ICD-10-CM

## 2024-02-08 DIAGNOSIS — R600 Localized edema: Secondary | ICD-10-CM | POA: Diagnosis not present

## 2024-02-08 DIAGNOSIS — R262 Difficulty in walking, not elsewhere classified: Secondary | ICD-10-CM | POA: Diagnosis not present

## 2024-02-08 DIAGNOSIS — M25671 Stiffness of right ankle, not elsewhere classified: Secondary | ICD-10-CM

## 2024-02-08 NOTE — Therapy (Signed)
 OUTPATIENT PHYSICAL THERAPY LOWER EXTREMITY TREATMENT NOTE  Patient Name: Joel Bailey MRN: [id number] DOB:1955/12/25, 68 y.o., male Today's Date: 02/08/2024  END OF SESSION:  PT End of Session - 02/08/24 1224     Visit Number 4    Number of Visits 19    Date for PT Re-Evaluation 03/13/24    Authorization Type [employer]    Authorization - Number of Visits 25    PT Start Time 1150    PT Stop Time 1232    PT Time Calculation (min) 42 min    Activity Tolerance Patient tolerated treatment well;No increased pain;Patient limited by fatigue    Behavior During Therapy [facility] for tasks assessed/performed              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/07/2017   Lumbar stenosis with neurogenic claudication 12/07/2017   Osteoarthritis    Pedal edema 08/01/2016   right sided     Tobacco abuse disorder    Unstable ankle, right 12/23/2022   Past Surgical History:  Procedure Laterality Date   ANTERIOR CERVICAL DECOMPRESSION/DISCECTOMY FUSION 4 LEVELS N/A 08/07/2017   Procedure: ANTERIOR CERVICAL DECOMPRESSION/DISCECTOMY FUSION CERVICAL 3- CERVICAL 4, CERVICAL 4 - CERVICAL 5, CERVICAL 5- CERVICAL 6, CERVICAL 6- CERVICAL 7;  Surgeon: Shirlean Kelly, MD;  Location: [facility] OR;  Service: Neurosurgery;  Laterality: N/A;   ARTHRODESIS METATARSALPHALANGEAL JOINT (MTPJ) Right 11/29/2023   Procedure: RIGHT GREAT TOE METATARSALPHALANGEAL JOINT (MTPJ) FUSION;  Surgeon: Nadara Mustard, MD;  Location: [facility] OR;  Service: Orthopedics;  Laterality: Right;   BACK SURGERY  2017   FOOT ARTHRODESIS Right 12/23/2022   Procedure:  RIGHT TIBIOCALCANEAL FUSION;  Surgeon: Nadara Mustard, MD;  Location: [facility] OR;  Service: Orthopedics;  Laterality: Right;   Patient Active Problem List   Diagnosis Date Noted   Claw toe, acquired, right 11/29/2023   Coronary artery calcification seen on CT scan 10/11/2023   Cigarette smoker 10/11/2023   Cardiac murmur 10/11/2023   Arterial atherosclerosis    Arthritis    Osteoarthritis    Tobacco abuse disorder    Unstable ankle, right 12/23/2022   Cavovarus deformity of foot, acquired, right 12/23/2022   Ankle weakness 02/16/2021   Acquired varus deformity of right foot 11/27/2018   Lumbar stenosis with neurogenic claudication 12/07/2017   HNP (herniated nucleus pulposus) with myelopathy, cervical 08/07/2017   Cigarette nicotine dependence without complication 08/01/2016   Erectile dysfunction 08/01/2016   Pedal edema 08/01/2016    PCP: Annamaria Helling, DO  REFERRING PROVIDER: Adonis Huguenin, NP  REFERRING DIAG:  Diagnosis  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  He is most interested in improving standing and walking endurance along with reducing/minimizing leg atrophy.  He is aware avoiding overuse is a high priority.  Jerimah had a back surgery in 2017 with right sided weakness.  He believes walking around with the right sided weakness and paresthesias led to his right foot and ankle issues.  He has paresthesias of the entire right foot since December 22, 2022.  He has been in a walking boot for over a year.  PERTINENT  HISTORY: Varus RT foot, OA, CAD, cervical HNP/ACDF C4-7, lumbar stenosis/surgery, unstable right ankle, Rt MTP joint fusion 11/29/2023, Rt tibio-calcaneal fusion 12/23/2022   PAIN:  Are you having pain? Yes: NPRS scale: Mostly paresthesias, bottom of the foot with pain 2-8/10 with prolonged weight-bearing Pain location: Right ankle and foot Pain description: Paresthesias, can be painful with WB Aggravating factors: Prolonged WB Relieving factors: Constant  PRECAUTIONS: Back  RED FLAGS: None   WEIGHT BEARING RESTRICTIONS:  Avoid overuse  FALLS:  Has patient fallen in last 6 months? 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  NEXT MD VISIT: 02/05/2024  OBJECTIVE:  Note: Objective measures were completed at Evaluation unless otherwise noted.  DIAGNOSTIC FINDINGS: Radiographs of the right foot show stable alignment of fusion hardware at  the right great toe metatarsal phalangeal joint.  There is no sign of  loosening.  PATIENT SURVEYS:  FOTO 33 (Goal 53 in 19 visits)  COGNITION: Overall cognitive status: Within functional limits for tasks assessed     SENSATION: Rt sided ankle and distal, bottom of the foot  EDEMA:  Noted and not objectively assessed   LOWER EXTREMITY ROM:  Active ROM Left/Right 01/17/2024 Right 02/02/2024  Hip flexion    Hip extension    Hip abduction    Hip adduction    Hip internal rotation    Hip external rotation    Knee flexion    Knee extension    Ankle dorsiflexion 15/0 0 (no change is expected with a fusion)  Ankle  plantarflexion    Ankle inversion    Ankle eversion     (Blank rows = not tested)  LOWER EXTREMITY STRENGTH:  Assessed in pounds with hand-held dynamometer Left/Right 01/17/2024 Right 02/02/2024  Hip flexion    Hip extension    Hip abduction    Hip adduction    Hip internal rotation    Hip external rotation    Knee flexion    Knee extension    Ankle dorsiflexion    Ankle plantarflexion    Ankle inversion 32.6/12.0 15.9  Ankle eversion 26.6/8.7 12.9   (Blank rows = not tested)   GAIT: Distance walked: 50 feet  Assistive device utilized: None Level of assistance: Complete Independence Comments: In a boot for over a year, very limited WB comfort and endurance  TREATMENT DATE:  02/08/2024 Thera-Band Green Inversion/Eversion and Plantar flexion 1 set of 15 x 3 seconds each with slow eccentrics  Seated straight Leg Raises 3 sets of 5 for 3 seconds  Functional Activities (standing, weight-bearing endurance): Standing mini-heel raises (very limited range, more of a weight-shift forward and back) 2 sets of 15 for 3 seconds with slow eccentrics, no hands second set Double Leg Press, feet high on foot plate to avoid dorsiflexion pressure (fusion), slow eccentrics, limited range 2 sets of 10 with 87# Single Leg Press right only, foot hight on foot plate to avoid dorsiflexion pressure (fusion), slow eccentrics, limited range 2 sets of 10 with 37# -Alternating hip hike 2 sets of 10 for 3 seconds  Neuromuscular re-education: Tandem balance 5 x 20 seconds bilateral for balance and endurance   02/02/2024 Thera-Band Green Inversion/Eversion and Plantar flexion 1 set of 10 x 3 seconds each with slow eccentrics  Seated straight Leg Raises 2 sets of 5 for 3 seconds  Functional Activities (standing, weight-bearing endurance): Standing mini-heel raises (very limited  range, more of a weight-shift forward and back) 2 sets of 10 for 3 seconds with slow eccentrics Double Leg Press, feet high on foot plate to avoid dorsiflexion pressure (fusion), slow eccentrics, limited range 2 sets of 10 with 75# Single Leg Press right only, foot hight on foot plate to avoid dorsiflexion pressure (fusion), slow eccentrics, limited range 2 sets of 10 with 37# Alternating hip hike 2 sets of 10 for 3 seconds  Neuromuscular re-education: Tandem balance 5 x 20 seconds bilateral for balance and endurance   01/24/2024 Thera-Band Green Inversion/Eversion and Plantar flexion 2 sets of 15 x 3 seconds each with slow eccentrics (1 set with PT assist, 1 set independently with feedback)  Functional Activities (standing endurance): Standing mini-heel raises (very limited range, more of a weight-shift forward and back) 3 sets of 10 for 3 seconds with slow eccentrics  Neuromuscular re-education: Tandem balance 5 x 20 seconds bilateral for balance and endurance   01/17/2024 Thera-Band Red Inversion/Eversion and Plantar flexion 10 x 3 seconds each with slow eccentrics  Functional Activities: Reviewed imaging, discussed why range will be limited with his 2 right foot fusions, discussed possible stabilizing strength gains helping WB function and reviewed exam findings    PATIENT EDUCATION:  Education details: See above Person educated: Patient Education method: Explanation, Demonstration, Tactile cues, Verbal cues, and Handouts Education comprehension: verbalized understanding, returned demonstration, verbal cues required, tactile cues required, and needs further education  HOME EXERCISE PROGRAM: Access Code: [email or web address] URL: [email or web address] Date: 02/02/2024 Prepared by: Pauletta Browns  Exercises - Ankle Inversion with Resistance  - 3-5 x daily - 7 x weekly - 1-2 sets - 10 reps - Ankle Eversion with Resistance  - 3-5 x daily - 7 x weekly - 1-2 sets - 10 reps -  Ankle Plantar Flexion with Resistance  - 3-5 x daily - 7 x weekly - 1-2 sets - 10 reps - Standing Heel Raise with Chair Support  - 3-5 x daily - 7 x weekly - 1-2 sets - 10 reps - 3 second hold - Tandem Stance  - 2-3 x daily - 7 x weekly - 1 sets - 5 reps - 20 second hold - Standing Hip Hiking  - 3-5 x daily - 7 x weekly - 1 sets - 10 reps - 3 seconds hold - Seated Range Straight Leg Raise  - 2 x daily - 7 x weekly - 3-5 sets - 5 reps -  3 seconds hold  ASSESSMENT:  CLINICAL IMPRESSION: Royston continues to be compliant with both his home exercises and in the office.  Avoiding overuse is as important as compliance and Adekunle is improving with this as well.  Calf strengthening, medial and lateral ankle stabilization and strengthening remain high priorities.  Dannon knows that this will be a long rehabilitation.  Continue to appropriately progress strength, endurance and stamina for return to normal standing, walking and functional activities.  Patient is a 68 y.o. 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  Rishi had a right ankle fusion in January 2024 and right 1st MTP joint fusion 11/29/2023.  His right lower extremity has been the problem side dating back to weakness from a back problem 8+ years ago.  Myson would like to be able to return to work and improve his WB comfort and endurance with physical therapy.  His prognosis for ankle strength gains is good, although with a foot and ankle fusion, Riggins will likely always have some limitations in his weight-bearing function and endurance.  We will reassess in 4 weeks to [facility] additional recommendations.  OBJECTIVE IMPAIRMENTS: Abnormal gait, decreased activity tolerance, decreased endurance, decreased knowledge of condition, difficulty walking, decreased ROM, decreased strength, increased edema, impaired perceived  functional ability, impaired sensation, and pain.   ACTIVITY LIMITATIONS: standing, squatting, stairs, and locomotion level  PARTICIPATION LIMITATIONS: community activity and occupation  PERSONAL FACTORS: Varus RT foot, OA, CAD, cervical HNP/ACDF C4-7, lumbar stenosis/surgery, unstable right ankle, Rt MTP joint fusion 11/29/2023, Rt tibio-calcaneal fusion 12/23/2022 are also affecting patient's functional outcome.   REHAB POTENTIAL: Good  CLINICAL DECISION MAKING: Evolving/moderate complexity  EVALUATION COMPLEXITY: Moderate   GOALS: Goals reviewed with patient? Yes  SHORT TERM GOALS: Target date: 02/14/2024 Benney will be independent with his long-term HEP at DC Baseline: Started 01/17/2024 Goal status: Met 02/02/2024  2.  Improve right ankle strength for inversion and eversion to 20 pounds Baseline: 12 and 8.7 pounds, respectively Goal status: On Going 02/02/2024   LONG TERM GOALS: Target date: 03/13/2024  Improve FOTO to 53 in 19 visits Baseline: 33 Goal status: INITIAL  2.  Improve right foot and ankle pain to consistently 0-4/10 on the VAS Baseline: Can be 5+, to the point of needing to sit or get off his feet Goal status: On Going 02/08/2024  3.  Improve right ankle strength to 30+ pounds for inversion and eversion Baseline: 12 and 8.7 pounds, respectively Goal status: INITIAL  4.  Improve plantar flexion strength as measured by improved standing and walking comfort and endurance Baseline: Objectively deferred due to fusion Goal status: INITIAL  5.  Deontre will be independent with his long-term HEP at DC Baseline: Started 01/17/2024 Goal status: INITIAL   PLAN:  PT FREQUENCY: 1-2x/week  PT DURATION: 8 weeks  PLANNED INTERVENTIONS: 97110-Therapeutic exercises, 97530- Therapeutic activity, 97112- Neuromuscular re-education, 97535- Self Care, [id number]- Manual therapy, [phone]- Gait training, [phone]- Vasopneumatic device, Patient/Family education, Balance training, Stair  training, and Cryotherapy  PLAN FOR NEXT SESSION: He has 5 degrees of plantar/dorsiflexion so possible to strengthen plantar flexors.  Balance, standing and weight-bearing endurance and function.  Hip abductors and quadriceps strength progressions as well to help with WB function.  Avoid overuse.   Cherlyn Cushing, PT, MPT 02/08/2024, 12:40 PM

## 2024-02-15 ENCOUNTER — Encounter: Payer: Self-pay | Admitting: Rehabilitative and Restorative Service Providers"

## 2024-02-15 ENCOUNTER — Ambulatory Visit: Payer: BC Managed Care – PPO | Admitting: Rehabilitative and Restorative Service Providers"

## 2024-02-15 DIAGNOSIS — M6281 Muscle weakness (generalized): Secondary | ICD-10-CM

## 2024-02-15 DIAGNOSIS — M25674 Stiffness of right foot, not elsewhere classified: Secondary | ICD-10-CM

## 2024-02-15 DIAGNOSIS — R600 Localized edema: Secondary | ICD-10-CM | POA: Diagnosis not present

## 2024-02-15 DIAGNOSIS — M25671 Stiffness of right ankle, not elsewhere classified: Secondary | ICD-10-CM

## 2024-02-15 DIAGNOSIS — R262 Difficulty in walking, not elsewhere classified: Secondary | ICD-10-CM

## 2024-02-15 NOTE — Therapy (Addendum)
 OUTPATIENT PHYSICAL THERAPY LOWER EXTREMITY TREATMENT NOTE  PHYSICAL THERAPY DISCHARGE SUMMARY  Visits from Start of Care: 5  Current functional level related to goals / functional outcomes: See note   Remaining deficits: See note   Education / Equipment: HEP   Patient agrees to discharge. Patient goals were partially met. Patient is being discharged due to not returning since the last visit.   Patient Name: BALTHAZAR DOOLY MRN: [id number] DOB:1955-12-16, 68 y.o., male Today's Date: 02/15/2024  END OF SESSION:  PT End of Session - 02/15/24 1154     Visit Number 5    Number of Visits 19    Date for PT Re-Evaluation 03/13/24    Authorization Type BCBS    Authorization - Number of Visits 25    PT Start Time 1147    PT Stop Time 1232    PT Time Calculation (min) 45 min    Activity Tolerance Patient tolerated treatment well;No increased pain    Behavior During Therapy [facility] for tasks assessed/performed               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/07/2017   Lumbar stenosis with neurogenic claudication 12/07/2017   Osteoarthritis    Pedal edema 08/01/2016   right sided     Tobacco abuse disorder    Unstable ankle, right 12/23/2022   Past Surgical History:  Procedure Laterality Date   ANTERIOR CERVICAL DECOMPRESSION/DISCECTOMY FUSION 4 LEVELS N/A 08/07/2017   Procedure: ANTERIOR CERVICAL DECOMPRESSION/DISCECTOMY FUSION CERVICAL 3- CERVICAL 4, CERVICAL 4 - CERVICAL 5, CERVICAL 5- CERVICAL 6, CERVICAL 6- CERVICAL 7;  Surgeon: Alix Charleston, MD;  Location: [facility] OR;  Service:  Neurosurgery;  Laterality: N/A;   ARTHRODESIS METATARSALPHALANGEAL JOINT (MTPJ) Right 11/29/2023   Procedure: RIGHT GREAT TOE METATARSALPHALANGEAL JOINT (MTPJ) FUSION;  Surgeon: Harden Jerona GAILS, MD;  Location: [facility] OR;  Service: Orthopedics;  Laterality: Right;   BACK SURGERY  2017   FOOT ARTHRODESIS Right 12/23/2022   Procedure: RIGHT TIBIOCALCANEAL FUSION;  Surgeon: Harden Jerona GAILS, MD;  Location: [facility] OR;  Service: Orthopedics;  Laterality: Right;   Patient Active Problem List   Diagnosis Date Noted   Claw toe, acquired, right 11/29/2023   Coronary artery calcification seen on CT scan 10/11/2023   Cigarette smoker 10/11/2023   Cardiac murmur 10/11/2023   Arterial atherosclerosis    Arthritis    Osteoarthritis    Tobacco abuse disorder    Unstable ankle, right 12/23/2022   Cavovarus deformity of foot, acquired, right 12/23/2022   Ankle weakness 02/16/2021   Acquired varus deformity of right foot 11/27/2018   Lumbar stenosis with neurogenic claudication 12/07/2017   HNP (herniated nucleus pulposus) with myelopathy, cervical 08/07/2017   Cigarette nicotine dependence without complication 08/01/2016   Erectile dysfunction 08/01/2016   Pedal edema 08/01/2016    PCP: Dene JAYSON Livings, DO  REFERRING PROVIDER: Rocky JONELLE Hans, NP  REFERRING DIAG:  Diagnosis  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  Improving standing and walking endurance along with reducing/minimizing leg  atrophy remain high priorities.  Avoiding overuse is also a high priority.  Jabaree had a back surgery in 2017 with right sided weakness.  He believes walking around with the right sided weakness and paresthesias led to his right foot and ankle issues.  He has paresthesias of the entire right foot since December 22, 2022.  He has been in a walking boot for over a year.  PERTINENT HISTORY: Varus RT foot, OA, CAD, cervical HNP/ACDF C4-7, lumbar stenosis/surgery, unstable right ankle, Rt MTP joint fusion 11/29/2023, Rt tibio-calcaneal fusion 12/23/2022   PAIN:  Are you having pain? Yes: NPRS scale: Mostly paresthesias, bottom of the foot with pain 2-8/10 with prolonged weight-bearing Pain location: Right ankle and foot Pain description: Paresthesias, can be painful with WB Aggravating factors: Prolonged WB Relieving factors: Constant  PRECAUTIONS: Back  RED FLAGS: None   WEIGHT BEARING RESTRICTIONS: Avoid overuse  FALLS:  Has patient fallen in last 6 months? 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  NEXT MD VISIT: 02/05/2024  OBJECTIVE:  Note: Objective measures were completed at Evaluation unless otherwise noted.  DIAGNOSTIC FINDINGS: Radiographs of the right foot show stable alignment of fusion hardware at  the right great toe metatarsal phalangeal joint.  There is no sign of  loosening.  PATIENT SURVEYS:  FOTO 33 (Goal 53 in 19 visits)  COGNITION: Overall cognitive status: Within functional limits for tasks assessed     SENSATION: Rt sided ankle and  distal, bottom of the foot  EDEMA:  Noted and not objectively assessed   LOWER EXTREMITY ROM:  Active ROM Left/Right 01/17/2024 Right 02/02/2024  Hip flexion    Hip extension    Hip abduction    Hip adduction    Hip internal rotation    Hip external rotation    Knee flexion    Knee extension    Ankle dorsiflexion 15/0 0 (no change is expected with a fusion)  Ankle plantarflexion    Ankle inversion    Ankle eversion     (Blank rows = not tested)  LOWER EXTREMITY STRENGTH:  Assessed in pounds with hand-held dynamometer Left/Right 01/17/2024 Right 02/02/2024 Right 3/6/205  Hip flexion     Hip extension     Hip abduction     Hip adduction     Hip internal rotation     Hip external rotation     Knee flexion     Knee extension     Ankle dorsiflexion     Ankle plantarflexion     Ankle inversion 32.6/12.0 15.9 18.7  Ankle eversion 26.6/8.7 12.9 15.2   (Blank rows = not tested)   GAIT: Distance walked: 50 feet  Assistive device utilized: None Level of  assistance: Complete Independence Comments: In a boot for over a year, very limited WB comfort and endurance                                                                                                                                TREATMENT DATE:  02/15/2024 Thera-Band Green Inversion/Eversion and Plantar flexion 1 set of 15 x 3 seconds each with slow eccentrics  Seated straight Leg Raises 3 sets of 5 for 3 seconds (HEP only)  Functional Activities (standing, weight-bearing endurance): Standing mini-heel raises (very limited range, more of a weight-shift forward and back) 15 for 3 seconds with slow eccentrics, no hands second set Double Leg Press, feet high on foot plate to avoid dorsiflexion pressure (fusion), slow eccentrics, limited range 2 sets of 15 with 93# Single Leg Press right only, foot hight on foot plate to avoid dorsiflexion pressure (fusion), slow eccentrics, limited range 2 sets of 10 with 37# Alternating hip  hike 2 sets of 10 for 3 seconds Squats in doorframe (drop back to maintain 90 degrees at the ankle) 10 x   Neuromuscular re-education: Tandem (overlap feet due to AROM limitations) balance 5 x 20 seconds bilateral for balance and endurance   02/08/2024 Thera-Band Green Inversion/Eversion and Plantar flexion 1 set of 15 x 3 seconds each with slow eccentrics  Seated straight Leg Raises 3 sets of 5 for 3 seconds  Functional Activities (standing, weight-bearing endurance): Standing mini-heel raises (very limited range, more of a weight-shift forward and back) 2 sets of 15 for 3 seconds with slow eccentrics, no hands second set Double Leg Press, feet high on foot plate to avoid dorsiflexion pressure (fusion), slow eccentrics, limited range 2 sets of 10 with 87# Single Leg Press right only, foot hight on foot plate to avoid dorsiflexion pressure (fusion), slow eccentrics, limited range 2 sets of 10 with 37# -Alternating hip hike 2 sets of 10 for 3 seconds  Neuromuscular re-education: Tandem balance 5 x 20 seconds bilateral for balance and endurance   02/02/2024 Thera-Band Green Inversion/Eversion and Plantar flexion 1 set of 10 x 3 seconds each with slow eccentrics  Seated straight Leg Raises 2 sets of 5 for 3 seconds  Functional Activities (standing, weight-bearing endurance): Standing mini-heel raises (very limited range, more of a weight-shift forward and back) 2 sets of 10 for 3 seconds with slow eccentrics Double Leg Press, feet high on foot plate to avoid dorsiflexion pressure (fusion), slow eccentrics, limited range 2 sets of 10 with 75# Single Leg Press right only, foot hight on foot plate to avoid dorsiflexion pressure (fusion), slow eccentrics, limited range 2 sets of 10 with 37# Alternating hip hike 2 sets of 10 for 3 seconds  Neuromuscular re-education: Tandem balance 5 x 20 seconds bilateral for balance and endurance    PATIENT EDUCATION:  Education details: See  above Person educated: Patient Education method: Explanation, Demonstration, Tactile cues, Verbal cues, and Handouts Education comprehension: verbalized  understanding, returned demonstration, verbal cues required, tactile cues required, and needs further education  HOME EXERCISE PROGRAM: Access Code: [email or web address] URL: [email or web address] Date: 02/15/2024 Prepared by: Lamar Ivory  Exercises - Ankle Inversion with Resistance  - 3-5 x daily - 7 x weekly - 1-2 sets - 10 reps - Ankle Eversion with Resistance  - 3-5 x daily - 7 x weekly - 1-2 sets - 10 reps - Ankle Plantar Flexion with Resistance  - 3-5 x daily - 7 x weekly - 1-2 sets - 10 reps - Standing Heel Raise with Chair Support  - 3-5 x daily - 7 x weekly - 1-2 sets - 10 reps - 3 second hold - Tandem Stance  - 2-3 x daily - 7 x weekly - 1 sets - 5 reps - 20 second hold - Standing Hip Hiking  - 3-5 x daily - 7 x weekly - 1 sets - 10 reps - 3 seconds hold - Small Range Straight Leg Raise  - 2 x daily - 7 x weekly - 3-5 sets - 5 reps - 3 seconds hold - Sit to Stand Without Arm Support  - 3-5 x daily - 7 x weekly - 1 sets - 5-10 reps  ASSESSMENT:  CLINICAL IMPRESSION: Iwao continues to be compliant with both his home exercises, although he may not be getting them in as often as recommended.  Avoiding overuse remains important.  Calf strengthening, medial and lateral ankle stabilization and strengthening remain high priorities.  Valdez knows that this will be a long rehabilitation, even with steady objective progress observed thus far.  Continue to appropriately progress strength, endurance and stamina for return to normal standing, walking and functional activities.  Patient is a 68 y.o. 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  Jamaine had a right ankle fusion in January 2024 and  right 1st MTP joint fusion 11/29/2023.  His right lower extremity has been the problem side dating back to weakness from a back problem 8+ years ago.  Amol would like to be able to return to work and improve his WB comfort and endurance with physical therapy.  His prognosis for ankle strength gains is good, although with a foot and ankle fusion, Maika will likely always have some limitations in his weight-bearing function and endurance.  We will reassess in 4 weeks to [facility] additional recommendations.  OBJECTIVE IMPAIRMENTS: Abnormal gait, decreased activity tolerance, decreased endurance, decreased knowledge of condition, difficulty walking, decreased ROM, decreased strength, increased edema, impaired perceived functional ability, impaired sensation, and pain.   ACTIVITY LIMITATIONS: standing, squatting, stairs, and locomotion level  PARTICIPATION LIMITATIONS: community activity and occupation  PERSONAL FACTORS: Varus RT foot, OA, CAD, cervical HNP/ACDF C4-7, lumbar stenosis/surgery, unstable right ankle, Rt MTP joint fusion 11/29/2023, Rt tibio-calcaneal fusion 12/23/2022 are also affecting patient's functional outcome.   REHAB POTENTIAL: Good  CLINICAL DECISION MAKING: Evolving/moderate complexity  EVALUATION COMPLEXITY: Moderate   GOALS: Goals reviewed with patient? Yes  SHORT TERM GOALS: Target date: 02/14/2024 Abdulla will be independent with his long-term HEP at [place] Baseline: Started 01/17/2024 Goal status: Met 02/02/2024  2.  Improve right ankle strength for inversion and eversion to 20 pounds Baseline: 12 and 8.7 pounds, respectively Goal status: On Going 02/15/2024   LONG TERM GOALS: Target date: 03/13/2024  Improve FOTO to 53 in 19 visits Baseline: 33 Goal status: INITIAL  2.  Improve right foot and ankle pain to consistently 0-4/10 on the VAS Baseline: Can be 5+, to the point of needing to sit or get off his feet Goal status: On Going 02/15/2024  3.  Improve right ankle strength to  30+ pounds for inversion and eversion Baseline: 12 and 8.7 pounds, respectively Goal status: INITIAL  4.  Improve plantar flexion strength as measured by improved standing and walking comfort and endurance Baseline: Objectively deferred due to fusion Goal status: INITIAL  5.  Demarius will be independent with his long-term HEP at DC Baseline: Started 01/17/2024 Goal status: INITIAL   PLAN:  PT FREQUENCY: 1-2x/week  PT DURATION: 8 weeks  PLANNED INTERVENTIONS: 97110-Therapeutic exercises, 97530- Therapeutic activity, 97112- Neuromuscular re-education, 97535- Self Care, 02859- Manual therapy, [phone]- Gait training, [phone]- Vasopneumatic device, Patient/Family education, Balance training, Stair training, and Cryotherapy  PLAN FOR NEXT SESSION: He has 5 degrees of plantar/dorsiflexion so possible to strengthen plantar flexors.  Balance, standing and weight-bearing endurance and function.  Hip abductors and quadriceps strength progressions as well to help with WB function.  Avoid overuse.  Consider transfer to independent PT due to steady progress, long rehab expected, good compliance with a program addressing current concerns and Paul's $45 co-pay.   Myer LELON Ivory, PT, MPT 02/15/2024, 1:07 PM

## 2024-02-22 ENCOUNTER — Encounter: Payer: BC Managed Care – PPO | Admitting: Rehabilitative and Restorative Service Providers"

## 2024-03-01 ENCOUNTER — Encounter: Admitting: Rehabilitative and Restorative Service Providers"

## 2024-03-01 ENCOUNTER — Other Ambulatory Visit (HOSPITAL_COMMUNITY): Payer: Self-pay | Admitting: Physician Assistant

## 2024-03-01 DIAGNOSIS — J439 Emphysema, unspecified: Secondary | ICD-10-CM

## 2024-03-01 DIAGNOSIS — Z720 Tobacco use: Secondary | ICD-10-CM

## 2024-03-04 ENCOUNTER — Other Ambulatory Visit (INDEPENDENT_AMBULATORY_CARE_PROVIDER_SITE_OTHER): Payer: Self-pay

## 2024-03-04 ENCOUNTER — Ambulatory Visit (INDEPENDENT_AMBULATORY_CARE_PROVIDER_SITE_OTHER): Payer: BC Managed Care – PPO | Admitting: Orthopedic Surgery

## 2024-03-04 ENCOUNTER — Encounter: Payer: Self-pay | Admitting: Orthopedic Surgery

## 2024-03-04 DIAGNOSIS — M545 Low back pain, unspecified: Secondary | ICD-10-CM | POA: Diagnosis not present

## 2024-03-04 DIAGNOSIS — G8929 Other chronic pain: Secondary | ICD-10-CM

## 2024-03-04 NOTE — Progress Notes (Signed)
 Office Visit Note   Patient: Joel Bailey           Date of Birth: 25-Jun-1956           MRN: [id number] Visit Date: 03/04/2024              Requested by: Annamaria Helling, DO [address] [place],  [place] [address] PCP: Annamaria Helling, DO  Chief Complaint  Patient presents with   Right Foot - Routine Post Op    11/29/23 Right GT MTPJ fusion      HPI: Patient is seen in follow-up for degenerative disc disease lumbar spine status post lower lumbar spine fusion.  Patient states he has weakness in the right lower extremity and states he has numbness and burning on the plantar aspect of the right foot.  Patient is currently in a postoperative shoe for the right great toe MTP fusion.  He states he could not wear a sneaker.  Assessment & Plan: Visit Diagnoses:  1. Chronic low back pain, unspecified back pain laterality, unspecified whether sciatica present     Plan: Patient will follow-up with neurosurgery for evaluation of the right lower extremity weakness and burning pain.  Patient may require CT myelogram.  He will follow-up in the office once his lumbar spine issues have stabilized.  Patient is still permanently disabled due to the right lower extremity weakness, right ankle fusion, and right great toe MTP fusion.  Follow-Up Instructions: Return if symptoms worsen or fail to improve.   Ortho Exam  Patient is alert, oriented, no adenopathy, well-dressed, normal affect, normal respiratory effort. Examination patient has a negative sciatic tension sign on the right however he does have burning pain in the plantar aspect of the right foot.  Hip flexion is weak on the right compared to the left.  Difficult to examine plantarflexion and dorsiflexion strength due to the ankle fusion and difficult to examine EHL strength due to the MTP fusion of the great toe.  Patient states that he symptomatically he has lower lumbar spine pain with radicular pain down the right  thigh.  Imaging: XR Lumbar Spine 2-3 Views Result Date: 03/04/2024 2 view radiographs of the lumbar spine shows stable fusion L4-5- S1 with collapse through the more proximal lumbar levels  No images are attached to the encounter.  Labs: No results found for: "HGBA1C", "ESRSEDRATE", "CRP", "LABURIC", "REPTSTATUS", "GRAMSTAIN", "CULT", "LABORGA"   Lab Results  Component Value Date   ALBUMIN 4.3 10/11/2023    No results found for: "MG" No results found for: "VD25OH"  No results found for: "PREALBUMIN"    Latest Ref Rng & Units 11/29/2023    6:45 AM 12/23/2022    7:25 AM 12/01/2017    1:26 PM  CBC EXTENDED  WBC 4.0 - 10.5 K/uL 12.2  13.7  16.0   RBC 4.22 - 5.81 MIL/uL 4.46  4.48  4.51   Hemoglobin 13.0 - 17.0 g/dL 69.6  29.5  28.4   HCT 39.0 - 52.0 % 42.5  42.9  43.3   Platelets 150 - 400 K/uL 276  245  277      There is no height or weight on file to calculate BMI.  Orders:  Orders Placed This Encounter  Procedures   XR Lumbar Spine 2-3 Views   No orders of the defined types were placed in this encounter.    Procedures: No procedures performed  Clinical Data: No additional findings.  ROS:  All other systems negative,  except as noted in the HPI. Review of Systems  Objective: Vital Signs: There were no vitals taken for this visit.  Specialty Comments:  No specialty comments available.  PMFS History: Patient Active Problem List   Diagnosis Date Noted   Claw toe, acquired, right 11/29/2023   Coronary artery calcification seen on CT scan 10/11/2023   Cigarette smoker 10/11/2023   Cardiac murmur 10/11/2023   Arterial atherosclerosis    Arthritis    Osteoarthritis    Tobacco abuse disorder    Unstable ankle, right 12/23/2022   Cavovarus deformity of foot, acquired, right 12/23/2022   Ankle weakness 02/16/2021   Acquired varus deformity of right foot 11/27/2018   Lumbar stenosis with neurogenic claudication 12/07/2017   HNP (herniated nucleus  pulposus) with myelopathy, cervical 08/07/2017   Cigarette nicotine dependence without complication 08/01/2016   Erectile dysfunction 08/01/2016   Pedal edema 08/01/2016   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/07/2017   Lumbar stenosis with neurogenic claudication 12/07/2017   Osteoarthritis    Pedal edema 08/01/2016   right sided     Tobacco abuse disorder    Unstable ankle, right 12/23/2022    Family History  Problem Relation Age of Onset   Diabetes Mother    Heart attack Mother    Heart disease Mother    Diabetes Sister    Heart disease Sister    Diabetes Sister    Diabetes Brother    Heart disease Brother     Past Surgical History:  Procedure Laterality Date   ANTERIOR CERVICAL DECOMPRESSION/DISCECTOMY FUSION 4 LEVELS N/A 08/07/2017   Procedure: ANTERIOR CERVICAL DECOMPRESSION/DISCECTOMY FUSION CERVICAL 3- CERVICAL 4, CERVICAL 4 - CERVICAL 5, CERVICAL 5- CERVICAL 6, CERVICAL 6- CERVICAL 7;  Surgeon: Shirlean Kelly, MD;  Location: [facility] OR;  Service: Neurosurgery;  Laterality: N/A;   ARTHRODESIS METATARSALPHALANGEAL JOINT (MTPJ) Right 11/29/2023   Procedure: RIGHT GREAT TOE METATARSALPHALANGEAL JOINT (MTPJ) FUSION;  Surgeon: Nadara Mustard, MD;  Location: [facility] OR;  Service: Orthopedics;  Laterality: Right;   BACK SURGERY  2017   FOOT ARTHRODESIS Right 12/23/2022   Procedure: RIGHT TIBIOCALCANEAL FUSION;  Surgeon: Nadara Mustard, MD;  Location: [facility] OR;  Service: Orthopedics;  Laterality: Right;   Social History   Occupational History   Not on file  Tobacco Use   Smoking status: Every Day     Current packs/day: 1.00    Average packs/day: 1 pack/day for 52.0 years (52.0 ttl pk-yrs)    Types: Cigarettes   Smokeless tobacco: Never  Vaping Use   Vaping status: Never Used  Substance and Sexual Activity   Alcohol use: No   Drug use: No   Sexual activity: Not on file

## 2024-03-07 ENCOUNTER — Encounter: Admitting: Rehabilitative and Restorative Service Providers"

## 2024-03-08 ENCOUNTER — Telehealth: Payer: Self-pay | Admitting: Orthopedic Surgery

## 2024-03-08 NOTE — Telephone Encounter (Signed)
 Pt walked in requesting a note excusing his from work for 2 months from now. Pt will come in office to pick up next week.

## 2024-03-11 NOTE — Telephone Encounter (Signed)
Letter written and at front desk ready for pickup

## 2024-03-15 ENCOUNTER — Ambulatory Visit (HOSPITAL_COMMUNITY)
Admission: RE | Admit: 2024-03-15 | Discharge: 2024-03-15 | Disposition: A | Discharge status: Home / Self Care | Source: Ambulatory Visit | Attending: Physician Assistant | Admitting: Physician Assistant

## 2024-03-15 DIAGNOSIS — F17210 Nicotine dependence, cigarettes, uncomplicated: Secondary | ICD-10-CM | POA: Diagnosis not present

## 2024-03-15 DIAGNOSIS — Z720 Tobacco use: Secondary | ICD-10-CM | POA: Insufficient documentation

## 2024-03-15 DIAGNOSIS — J439 Emphysema, unspecified: Secondary | ICD-10-CM | POA: Diagnosis not present

## 2024-04-03 ENCOUNTER — Telehealth: Payer: Self-pay | Admitting: Orthopedic Surgery

## 2024-04-03 NOTE — Telephone Encounter (Signed)
 Patient called and ask for a copy of his xrays. CB#[phone]

## 2024-04-04 ENCOUNTER — Encounter: Payer: Self-pay | Admitting: Neurology

## 2024-04-04 ENCOUNTER — Other Ambulatory Visit: Payer: Self-pay

## 2024-04-04 DIAGNOSIS — R202 Paresthesia of skin: Secondary | ICD-10-CM

## 2024-04-04 DIAGNOSIS — M21371 Foot drop, right foot: Secondary | ICD-10-CM | POA: Diagnosis not present

## 2024-04-04 NOTE — Telephone Encounter (Signed)
Called and advised patient CD was ready for pickup  

## 2024-04-26 ENCOUNTER — Telehealth: Payer: Self-pay | Admitting: Orthopedic Surgery

## 2024-04-26 NOTE — Telephone Encounter (Signed)
 I called patient. Note at front for pick up.

## 2024-04-26 NOTE — Telephone Encounter (Signed)
 I called pt asking that letter was already sent per Julio Ohm. Pt states that letter was sent to his long term disability and need a copy of that letter. He states his employer never received that because it was sent straight to long term. Pt is asking to pick up a copy on Monday. Pease call pt at [phone].

## 2024-04-26 NOTE — Telephone Encounter (Signed)
 Pt called requesting a up to date out of work note. Pt states he need it before Monday. Please send letter to pt mychart. Please call pt at [phone].

## 2024-04-30 ENCOUNTER — Ambulatory Visit: Admitting: Neurology

## 2024-04-30 DIAGNOSIS — R202 Paresthesia of skin: Secondary | ICD-10-CM | POA: Diagnosis not present

## 2024-04-30 DIAGNOSIS — M5417 Radiculopathy, lumbosacral region: Secondary | ICD-10-CM

## 2024-04-30 NOTE — Procedures (Signed)
 [facility]  [address] [place], [address]  [place], [place] [address] Tel: [phone] Fax: [phone] Test Date:  04/30/2024  Patient: Joel Bailey DOB: Oct 31, 1956 Physician: Rommie Coats, MD  Sex: Male Height: 5\' 10"  Ref Phys: Waymond Hailey, MD  ID#: [id number]   Technician:    History: This is a 68 year old male with right lower limb weakness, numbness, and tingling.  NCV & EMG Findings: Extensive electrodiagnostic evaluation of the right lower limb with additional nerve conduction studies of the left lower limb shows: Right sural and superficial peroneal/fibular sensory responses are absent. Left sural and superficial peroneal/fibular sensory responses are within normal limits. Right peroneal/fibular (EDB) and tibial (AH) motor responses are absent. Left peroneal/fibular (EDB) and tibial (AH) motor responses are within normal limits. Right peroneal/fibular (TA) motor response is within normal limits but asymmetric when compared to left peroneal/fibular (TA) (3.1 mV vs 5.7 mV). Right H reflex is absent. Left H reflex latency is within normal limits. Chronic motor axon loss changes WITH accompanying active denervation changes are seen in the right tibialis anterior and medial head of gastrocnemius muscles. Chronic motor axon loss changes WITHOUT accompanying active denervation changes are seen in the short head of biceps femoris, gluteus medius, and gluteus maximus muscles. Lumbosacral paraspinal muscles were deferred due to prior lumbosacral spine surgery.  Impression: This is a complex study. There is significant abnormalities of the right foot/ankle which make nerve conduction studies in this area unreliable. As such, nerve conductions in this area should be interpreted with caution. 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 The most  distal innervated muscles have active ongoing denervation changes. The findings are moderate in degree electrically at right L5 and mild to moderate in degree electrically at left S1. A superimposed sciatic mononeuropathy or tibial or peroneal mononeuropathies are also possible, but absent sensory findings may be due to altered anatomy of right foot/ankle and symptoms may be better explained by #1 above. No electrodiagnostic evidence of a large fiber sensorimotor polyneuropathy as evidenced by normal nerve conduction studies on left lower limb.    ___________________________ Rommie Coats, MD    Nerve Conduction Studies Motor Nerve Results    Latency Amplitude F-Lat Segment Distance CV Comment  Site (ms) Norm (mV) Norm (ms)  (cm) (m/s) Norm   Left Fibular (EDB) Motor  Ankle 4.2  < 6.0 3.6  > 2.5        Bel fib head 11.3 - 3.1 -  Bel fib head-Ankle 30 42  > 40   Pop fossa 13.1 - 3.1 -  Pop fossa-Bel fib head 9 50 -   Right Fibular (EDB) Motor  Ankle *NR  < 6.0 *NR  > 2.5        Bel fib head *NR - *NR -  Bel fib head-Ankle - *NR  > 40   Pop fossa *NR - *NR -  Pop fossa-Bel fib head - *NR -   Left Fibular (TA) Motor  Fib head 2.2  < 4.5 5.7  > 3.0        Pop fossa 3.7  < 6.7 5.7 -  Pop fossa-Fib head 10 67  > 40   Right Fibular (TA) Motor  Fib head 3.1  < 4.5 3.1  > 3.0        Pop fossa 4.9  < 6.7 2.8 -  Pop fossa-Fib head 10 56  > 40   Left Tibial (AH) Motor  Ankle 3.5  < 6.0 14.8  > 4.0        Knee 13.0 - 8.1 -  Knee-Ankle 44 46  > 40   Right Tibial (AH) Motor  Ankle *NR  < 6.0 *NR  > 4.0        Knee *NR - *NR -  Knee-Ankle - *NR  > 40    Sensory Sites    Neg Peak Lat Amplitude (O-P) Segment Distance Velocity Comment  Site (ms) Norm (V) Norm  (cm) (ms)   Left Superficial Fibular Sensory  14 cm-Ankle 2.8  < 4.6 5  > 3 14 cm-Ankle 14    Right Superficial Fibular Sensory  14 cm-Ankle *NR  < 4.6 *NR  > 3 14 cm-Ankle 14    Left Sural Sensory  Calf-Lat mall 3.0  < 4.6 4  > 3  Calf-Lat mall 14    Right Sural Sensory  Calf-Lat mall *NR  < 4.6 *NR  > 3 Calf-Lat mall 14     H-Reflex Results    M-Lat H Lat H Neg Amp H-M Lat  Site (ms) (ms) Norm (mV) (ms)  Left Tibial H-Reflex  Pop fossa 6.9 33.5  < 35.0 1.22 26.6  Right Tibial H-Reflex  Pop fossa 5.5 -  < 35.0 - -   Electromyography   Side Muscle Ins.Act Fibs Fasc Recrt Amp Dur Poly Activation Comment  Right Tib ant Nml *1+ Nml *2- *1+ *1+ *1+ Nml N/A  Right Gastroc MH Nml *1+ Nml *2- *1+ *1+ Nml Nml N/A  Right Rectus fem Nml Nml Nml Nml Nml Nml Nml Nml N/A  Right Biceps fem SH Nml Nml Nml *1- *1+ *1+ *1+ Nml N/A  Right Gluteus med Nml Nml Nml *2- *1+ *1+ Nml Nml N/A  Right Gluteus max Nml Nml Nml *1- *1+ *1+ Nml Nml N/A      Waveforms:  Motor               Sensory           H-Reflex

## 2024-05-08 DIAGNOSIS — M47816 Spondylosis without myelopathy or radiculopathy, lumbar region: Secondary | ICD-10-CM | POA: Diagnosis not present

## 2024-05-13 ENCOUNTER — Encounter: Admitting: Neurology

## 2024-05-23 DIAGNOSIS — M21371 Foot drop, right foot: Secondary | ICD-10-CM | POA: Diagnosis not present

## 2024-07-04 ENCOUNTER — Ambulatory Visit: Admitting: Orthopedic Surgery

## 2024-07-04 DIAGNOSIS — M2021 Hallux rigidus, right foot: Secondary | ICD-10-CM

## 2024-07-04 DIAGNOSIS — M545 Low back pain, unspecified: Secondary | ICD-10-CM

## 2024-07-04 DIAGNOSIS — M21371 Foot drop, right foot: Secondary | ICD-10-CM

## 2024-07-04 DIAGNOSIS — G8929 Other chronic pain: Secondary | ICD-10-CM

## 2024-07-14 ENCOUNTER — Encounter: Payer: Self-pay | Admitting: Orthopedic Surgery

## 2024-07-14 NOTE — Progress Notes (Unsigned)
 Office Visit Note   Patient: Joel Bailey           Date of Birth: 03-26-1956           MRN: [id number] Visit Date: 07/04/2024              Requested by: Conley Dene BROCKS, DO [address] [place],  [place] [address] PCP: Conley Dene BROCKS, DO  Chief Complaint  Patient presents with   Right Foot - Follow-up      HPI: Patient is a 68 year old gentleman who presents with several medical conditions.  Patient has had foot drop on the right he has been in a fracture boot using the AFO.  Patient has had a lumbar spine symptoms he has had a scan and nerve conduction studies.  He did see Dr. Joshua regarding his lumbar spine and was told that nothing could be done.  Patient is status post fusion L4-5 and L5-S1 in 2018.  Patient has had a AFO brace from [employer].  Patient states he still has numbness and tingling in the bottom of his foot.  Patient states he has difficulty getting his foot into the shoe and brace secondary to the dropfoot.  Assessment & Plan: Visit Diagnoses:  1. Chronic low back pain, unspecified back pain laterality, unspecified whether sciatica present   2. Foot drop, right foot     Plan: Patient was provided a prescription for [employer] for a Velcro shoe to see if this would make it easier to get into a shoe with an AFO.  Patient is still permanently disabled.  Follow-Up Instructions: Return if symptoms worsen or fail to improve.   Ortho Exam  Patient is alert, oriented, no adenopathy, well-dressed, normal affect, normal respiratory effort. Examination patient has foot drop on the right.  Patient is unable to get his foot into a shoe and brace with a shoe that has laces.  He still has numbness and tingling on the bottom of the right foot.  Patient has a stable fusion of the MTP joint of the great toe.    Imaging: No results found. No images are attached to the encounter.  Labs: No results found for: HGBA1C, ESRSEDRATE, CRP, LABURIC, REPTSTATUS,  GRAMSTAIN, CULT, LABORGA   Lab Results  Component Value Date   ALBUMIN  4.3 10/11/2023    No results found for: MG No results found for: VD25OH  No results found for: PREALBUMIN    Latest Ref Rng & Units 11/29/2023    6:45 AM 12/23/2022    7:25 AM 12/01/2017    1:26 PM  CBC EXTENDED  WBC 4.0 - 10.5 K/uL 12.2  13.7  16.0   RBC 4.22 - 5.81 MIL/uL 4.46  4.48  4.51   Hemoglobin 13.0 - 17.0 g/dL 85.6  85.2  85.2   HCT 39.0 - 52.0 % 42.5  42.9  43.3   Platelets 150 - 400 K/uL 276  245  277      There is no height or weight on file to calculate BMI.  Orders:  No orders of the defined types were placed in this encounter.  No orders of the defined types were placed in this encounter.    Procedures: No procedures performed  Clinical Data: No additional findings.  ROS:  All other systems negative, except as noted in the HPI. Review of Systems  Objective: Vital Signs: There were no vitals taken for this visit.  Specialty Comments:  No specialty comments available.  PMFS History: Patient  Active Problem List   Diagnosis Date Noted   Claw toe, acquired, right 11/29/2023   Coronary artery calcification seen on CT scan 10/11/2023   Cigarette smoker 10/11/2023   Cardiac murmur 10/11/2023   Arterial atherosclerosis    Arthritis    Osteoarthritis    Tobacco abuse disorder    Unstable ankle, right 12/23/2022   Cavovarus deformity of foot, acquired, right 12/23/2022   Ankle weakness 02/16/2021   Acquired varus deformity of right foot 11/27/2018   Lumbar stenosis with neurogenic claudication 12/07/2017   HNP (herniated nucleus pulposus) with myelopathy, cervical 08/07/2017   Cigarette nicotine dependence without complication 08/01/2016   Erectile dysfunction 08/01/2016   Pedal edema 08/01/2016   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   Ankle weakness 02/16/2021   Arterial atherosclerosis    Arthritis     Cavovarus deformity of foot, acquired, right 12/23/2022   Cigarette nicotine dependence without complication 08/01/2016   Coronary artery disease    Erectile dysfunction 08/01/2016   Heart murmur    HNP (herniated nucleus pulposus) with myelopathy, cervical 08/07/2017   Lumbar stenosis with neurogenic claudication 12/07/2017   Osteoarthritis    Pedal edema 08/01/2016   right sided     Tobacco abuse disorder    Unstable ankle, right 12/23/2022    Family History  Problem Relation Age of Onset   Diabetes Mother    Heart attack Mother    Heart disease Mother    Diabetes Sister    Heart disease Sister    Diabetes Sister    Diabetes Brother    Heart disease Brother     Past Surgical History:  Procedure Laterality Date   ANTERIOR CERVICAL DECOMPRESSION/DISCECTOMY FUSION 4 LEVELS N/A 08/07/2017   Procedure: ANTERIOR CERVICAL DECOMPRESSION/DISCECTOMY FUSION CERVICAL 3- CERVICAL 4, CERVICAL 4 - CERVICAL 5, CERVICAL 5- CERVICAL 6, CERVICAL 6- CERVICAL 7;  Surgeon: Alix Charleston, MD;  Location: [facility] OR;  Service: Neurosurgery;  Laterality: N/A;   ARTHRODESIS METATARSALPHALANGEAL JOINT (MTPJ) Right 11/29/2023   Procedure: RIGHT GREAT TOE METATARSALPHALANGEAL JOINT (MTPJ) FUSION;  Surgeon: Harden Jerona GAILS, MD;  Location: [facility] OR;  Service: Orthopedics;  Laterality: Right;   BACK SURGERY  2017   FOOT ARTHRODESIS Right 12/23/2022   Procedure: RIGHT TIBIOCALCANEAL FUSION;  Surgeon: Harden Jerona GAILS, MD;  Location: [facility] OR;  Service: Orthopedics;  Laterality: Right;   Social History   Occupational History   Not on file  Tobacco Use   Smoking status: Every Day    Current packs/day: 1.00    Average packs/day: 1 pack/day for 52.0 years (52.0 ttl pk-yrs)    Types: Cigarettes   Smokeless tobacco: Never  Vaping Use   Vaping status: Never Used  Substance and Sexual Activity   Alcohol use: No   Drug use: No   Sexual activity: Not on file

## 2024-08-13 DIAGNOSIS — M21371 Foot drop, right foot: Secondary | ICD-10-CM | POA: Diagnosis not present

## 2024-08-26 DIAGNOSIS — Z125 Encounter for screening for malignant neoplasm of prostate: Secondary | ICD-10-CM | POA: Diagnosis not present

## 2024-08-26 DIAGNOSIS — I700 Atherosclerosis of aorta: Secondary | ICD-10-CM | POA: Diagnosis not present

## 2024-08-26 DIAGNOSIS — J439 Emphysema, unspecified: Secondary | ICD-10-CM | POA: Diagnosis not present

## 2024-08-26 DIAGNOSIS — Z1211 Encounter for screening for malignant neoplasm of colon: Secondary | ICD-10-CM | POA: Diagnosis not present

## 2024-08-26 DIAGNOSIS — Z0000 Encounter for general adult medical examination without abnormal findings: Secondary | ICD-10-CM | POA: Diagnosis not present

## 2024-10-14 ENCOUNTER — Encounter: Payer: Self-pay | Admitting: Radiology

## 2024-11-13 ENCOUNTER — Other Ambulatory Visit: Payer: Self-pay | Admitting: Cardiology

## 2024-11-13 DIAGNOSIS — I2510 Atherosclerotic heart disease of native coronary artery without angina pectoris: Secondary | ICD-10-CM

## 2024-11-13 DIAGNOSIS — I7090 Unspecified atherosclerosis: Secondary | ICD-10-CM

## 2024-12-20 ENCOUNTER — Other Ambulatory Visit: Payer: Self-pay | Admitting: Cardiology

## 2024-12-20 DIAGNOSIS — I7090 Unspecified atherosclerosis: Secondary | ICD-10-CM

## 2024-12-20 DIAGNOSIS — I2510 Atherosclerotic heart disease of native coronary artery without angina pectoris: Secondary | ICD-10-CM

## 2025-01-16 ENCOUNTER — Other Ambulatory Visit: Payer: Self-pay

## 2025-01-16 DIAGNOSIS — R011 Cardiac murmur, unspecified: Secondary | ICD-10-CM | POA: Insufficient documentation

## 2025-01-16 DIAGNOSIS — I2510 Atherosclerotic heart disease of native coronary artery without angina pectoris: Secondary | ICD-10-CM | POA: Insufficient documentation

## 2025-01-17 ENCOUNTER — Other Ambulatory Visit: Payer: Self-pay

## 2025-01-17 DIAGNOSIS — I2510 Atherosclerotic heart disease of native coronary artery without angina pectoris: Secondary | ICD-10-CM

## 2025-01-17 DIAGNOSIS — I7090 Unspecified atherosclerosis: Secondary | ICD-10-CM

## 2025-01-17 MED ORDER — ROSUVASTATIN CALCIUM 10 MG PO TABS: 10.0000 mg | ORAL_TABLET | Freq: Every day | ORAL | 0 refills | Status: AC

## 2025-01-23 ENCOUNTER — Ambulatory Visit: Admitting: Cardiology
